# Patient Record
Sex: Female | Born: 1990
Health system: Southern US, Community
[De-identification: ages and names within clinical notes are randomized; demographics above are authoritative.]

## PROBLEM LIST (undated history)

## (undated) DIAGNOSIS — L659 Nonscarring hair loss, unspecified: Secondary | ICD-10-CM

## (undated) DIAGNOSIS — J302 Other seasonal allergic rhinitis: Secondary | ICD-10-CM

## (undated) DIAGNOSIS — J069 Acute upper respiratory infection, unspecified: Secondary | ICD-10-CM

## (undated) HISTORY — PX: NO PAST SURGERIES: SHX2092

## (undated) HISTORY — DX: Nonscarring hair loss, unspecified: L65.9

---

## 2014-05-22 DIAGNOSIS — L639 Alopecia areata, unspecified: Secondary | ICD-10-CM | POA: Insufficient documentation

## 2015-05-19 LAB — HM PAP SMEAR: HM Pap smear: NEGATIVE

## 2015-05-19 LAB — HM HIV SCREENING LAB: HM HIV Screening: NEGATIVE

## 2015-05-19 LAB — HM HEPATITIS C SCREENING LAB: HM Hepatitis Screen: NEGATIVE

## 2015-07-07 DIAGNOSIS — J069 Acute upper respiratory infection, unspecified: Secondary | ICD-10-CM | POA: Diagnosis not present

## 2015-07-07 DIAGNOSIS — R41840 Attention and concentration deficit: Secondary | ICD-10-CM | POA: Diagnosis not present

## 2015-10-26 DIAGNOSIS — R591 Generalized enlarged lymph nodes: Secondary | ICD-10-CM | POA: Diagnosis not present

## 2015-12-16 DIAGNOSIS — Z01419 Encounter for gynecological examination (general) (routine) without abnormal findings: Secondary | ICD-10-CM | POA: Diagnosis not present

## 2015-12-16 DIAGNOSIS — N909 Noninflammatory disorder of vulva and perineum, unspecified: Secondary | ICD-10-CM | POA: Diagnosis not present

## 2016-02-15 DIAGNOSIS — J019 Acute sinusitis, unspecified: Secondary | ICD-10-CM | POA: Diagnosis not present

## 2016-02-15 DIAGNOSIS — B354 Tinea corporis: Secondary | ICD-10-CM | POA: Diagnosis not present

## 2016-02-15 DIAGNOSIS — H6692 Otitis media, unspecified, left ear: Secondary | ICD-10-CM | POA: Diagnosis not present

## 2016-02-15 MED FILL — KETOCONAZOLE 2% CREAM: 2 | 10 days supply | Qty: 30 | Fill #0

## 2016-02-15 MED FILL — AMOX-CLAV 875-125 MG TABLET: 875-125 | 10 days supply | Qty: 20 | Fill #0

## 2016-03-01 ENCOUNTER — Ambulatory Visit (INDEPENDENT_AMBULATORY_CARE_PROVIDER_SITE_OTHER): Payer: 59 | Admitting: Family Medicine

## 2016-03-01 ENCOUNTER — Encounter: Payer: Self-pay | Admitting: Family Medicine

## 2016-03-01 VITALS — BP 110/78 | HR 108 | Temp 98.5°F | Ht 63.0 in | Wt 171.2 lb

## 2016-03-01 DIAGNOSIS — J3089 Other allergic rhinitis: Secondary | ICD-10-CM

## 2016-03-01 DIAGNOSIS — Z1389 Encounter for screening for other disorder: Secondary | ICD-10-CM

## 2016-03-01 DIAGNOSIS — J029 Acute pharyngitis, unspecified: Secondary | ICD-10-CM

## 2016-03-01 DIAGNOSIS — L639 Alopecia areata, unspecified: Secondary | ICD-10-CM

## 2016-03-01 DIAGNOSIS — Z139 Encounter for screening, unspecified: Secondary | ICD-10-CM

## 2016-03-01 DIAGNOSIS — J069 Acute upper respiratory infection, unspecified: Secondary | ICD-10-CM | POA: Diagnosis not present

## 2016-03-01 DIAGNOSIS — Z7189 Other specified counseling: Secondary | ICD-10-CM

## 2016-03-01 DIAGNOSIS — G43909 Migraine, unspecified, not intractable, without status migrainosus: Secondary | ICD-10-CM | POA: Insufficient documentation

## 2016-03-01 DIAGNOSIS — G43009 Migraine without aura, not intractable, without status migrainosus: Secondary | ICD-10-CM

## 2016-03-01 DIAGNOSIS — Z8659 Personal history of other mental and behavioral disorders: Secondary | ICD-10-CM

## 2016-03-01 LAB — POCT RAPID STREP A (OFFICE): Rapid Strep A Screen: NEGATIVE

## 2016-03-01 MED ORDER — METHYLPREDNISOLONE ACETATE 40 MG/ML IJ SUSP
40.0000 mg | Freq: Once | INTRAMUSCULAR | Status: AC
  Administered 2016-03-01: 40 mg via INTRAMUSCULAR

## 2016-03-01 MED ORDER — FEXOFENADINE-PSEUDOEPHED ER 180-240 MG PO TB24: 1.0000 | ORAL_TABLET | Freq: Every day | ORAL | 1 refills | Status: DC

## 2016-03-01 MED ORDER — FLUTICASONE PROPIONATE 50 MCG/ACT NA SUSP: 1.0000 | Freq: Two times a day (BID) | NASAL | 6 refills | Status: DC

## 2016-03-01 MED FILL — ALLEGRA-D 24 HOUR TABLET: 180-240 | 45 days supply | Qty: 45 | Fill #0

## 2016-03-01 MED FILL — FLUTICASONE PROP 50 MCG SPR: 50 | 30 days supply | Qty: 16 | Fill #0

## 2016-03-01 NOTE — Assessment & Plan Note (Addendum)
8-24yo on ritalin. Not a current issue for patient

## 2016-03-01 NOTE — Assessment & Plan Note (Signed)
Patient had not been taking Allegra and/or Flonase regularly. Strongly encouraged to use daily along with Lloyd HugerNeil med sinus rinses twice a day.   Handouts provided and prevention strategies discussed

## 2016-03-01 NOTE — Assessment & Plan Note (Signed)
Did get shots at [facility] in past.  NOt active issue since OCt '15.

## 2016-03-01 NOTE — Patient Instructions (Signed)
-  Discussed with patient seasonal allergies and typical preventative strategies- i.e. N95 mask etc  -Recommended nasal rinses\ washes twice a day as well as after any prolonged exposure to outdoor environmental factors.    - Do the AYR or neil med sinus rinses BID and then use 1 spray flonase after each session.   If you develop fevers and one of your sinuses starts to hurt on one side your face, call our office.    Allergic Rhinitis Allergic rhinitis is when the mucous membranes in the nose respond to allergens. Allergens are particles in the air that cause your body to have an allergic reaction. This causes you to release allergic antibodies. Through a chain of events, these eventually cause you to release histamine into the blood stream. Although meant to protect the body, it is this release of histamine that causes your discomfort, such as frequent sneezing, congestion, and an itchy, runny nose.  CAUSES Seasonal allergic rhinitis (hay fever) is caused by pollen allergens that may come from grasses, trees, and weeds. Year-round allergic rhinitis (perennial allergic rhinitis) is caused by allergens such as house dust mites, pet dander, and mold spores. SYMPTOMS  Nasal stuffiness (congestion).  Itchy, runny nose with sneezing and tearing of the eyes. DIAGNOSIS Your health care provider can help you determine the allergen or allergens that trigger your symptoms. If you and your health care provider are unable to determine the allergen, skin or blood testing may be used. Your health care provider will diagnose your condition after taking your health history and performing a physical exam. Your health care provider may assess you for other related conditions, such as asthma, pink eye, or an ear infection. TREATMENT Allergic rhinitis does not have a cure, but it can be controlled by:  Medicines that block allergy symptoms. These may include allergy shots, nasal sprays, and oral  antihistamines.  Avoiding the allergen. Hay fever may often be treated with antihistamines in pill or nasal spray forms. Antihistamines block the effects of histamine. There are over-the-counter medicines that may help with nasal congestion and swelling around the eyes. Check with your health care provider before taking or giving this medicine. If avoiding the allergen or the medicine prescribed do not work, there are many new medicines your health care provider can prescribe. Stronger medicine may be used if initial measures are ineffective. Desensitizing injections can be used if medicine and avoidance does not work. Desensitization is when a patient is given ongoing shots until the body becomes less sensitive to the allergen. Make sure you follow up with your health care provider if problems continue. HOME CARE INSTRUCTIONS It is not possible to completely avoid allergens, but you can reduce your symptoms by taking steps to limit your exposure to them. It helps to know exactly what you are allergic to so that you can avoid your specific triggers. SEEK MEDICAL CARE IF:  You have a fever.  You develop a cough that does not stop easily (persistent).  You have shortness of breath.  You start wheezing.  Symptoms interfere with normal daily activities.   This information is not intended to replace advice given to you by your health care provider. Make sure you discuss any questions you have with your health care provider.   Document Released: 02/21/2001 Document Revised: 06/19/2014 Document Reviewed: 02/03/2013 Elsevier Interactive Patient Education [employer] Elsevier Inc.

## 2016-03-01 NOTE — Assessment & Plan Note (Signed)
You have a viral infection that should resolve on its own over time.    Symptoms usually last 3-7 days but can stretch out to 2-3 weeks.  Your symptoms should not worsen after 7-10 days and if they do, please notify our office  You can use over-the-counter afrin nasal spray for up to 3 days (NO longer than that) which will help with nasal drainage/congestion short term.   Also, sterile saline nasal rinses, such as Lloyd HugerNeil med sinus rinse, can be very helpful and should be done twice daily.  I would also like you to use an over the counter cold and flu medication such as Tylenol Severe Cold and Sinus or Dayquil/ Nyquil and the like which will help with cough, congestion, pain, and fevers/chills etc.  Unfortunately, antibiotics are not helpful for viral infections.  Wash your hands frequently, as you did not want to get those around you sick as well. Never sneeze or cough on others.  Drink plenty of fluids and stay hydrated, especially if you are running fevers.

## 2016-03-01 NOTE — Progress Notes (Signed)
New patient office visit note:  Impression and Recommendations:    1. Viral URI   2. Sore throat   3. Environmental and seasonal allergies   4. Screening for condition   5. Screening for multiple conditions   6. Counseling on health promotion and disease prevention   7. History of ADHD   8. h/o Alopecia areata   9. Migraine without aura and without status migrainosus, not intractable     h/o Alopecia areata Did get shots at [facility] de[facility] in past.  NOt active issue since OCt '15.    Migraine headache Allergic to all triptans.  Uses excedrin migraine prn.  - Gets them about once every month or so, mild headaches twice weekly--> goes away with one exedrin migraine.    History of ADHD 8-14yo on ritalin. Not a current issue for patient  Environmental and seasonal allergies Patient had not been taking Allegra and/or Flonase regularly. Strongly encouraged to use daily along with Lloyd Huger med sinus rinses twice a day.   Handouts provided and prevention strategies discussed   Viral URI You have a viral infection that should resolve on its own over time, were discussed with patient this could be allergies.  Reassurance given  Symptoms usually last 3-7 days but can stretch out to 2-3 weeks.  Your symptoms should not worsen after 7-10 days and if they do, please notify our office  You can use over-the-counter afrin nasal spray for up to 3 days (NO longer than that) which will help with nasal drainage/congestion short term.   Also, sterile saline nasal rinses, such as Lloyd Huger med sinus rinse, can be very helpful and should be done twice daily.  I would also like you to use an over the counter cold and flu medication such as Tylenol Severe Cold and Sinus or Dayquil/ Nyquil and the like which will help with cough, congestion, pain, and fevers/chills etc.  Unfortunately, antibiotics are not helpful for viral infections.  Wash your hands frequently, as you did not want to  get those around you sick as well. Never sneeze or cough on others.  Drink plenty of fluids and stay hydrated, especially if you are running fevers.   Orders Placed This Encounter  Procedures  . POCT rapid strep A     New Prescriptions   FEXOFENADINE-PSEUDOEPHEDRINE (ALLEGRA-D ALLERGY & CONGESTION) 180-240 MG 24 HR TABLET    Take 1 tablet by mouth daily.   FLUTICASONE (FLONASE) 50 MCG/ACT NASAL SPRAY    Place 1 spray into both nostrils 2 (two) times daily.    Modified Medications   No medications on file    Discontinued Medications   No medications on file    Current Meds  Medication Sig  . aspirin-acetaminophen-caffeine (EXCEDRIN MIGRAINE) 250-250-65 MG tablet Take 1-2 tablets by mouth every 6 (six) hours as needed for headache.  . etonogestrel (NEXPLANON) 68 MG IMPL implant 1 each by Subdermal route once.  . Multiple Vitamin (MULTIVITAMIN) capsule Take 1 capsule by mouth daily.    The patient was counseled, risk factors were discussed, anticipatory guidance given.  Gross side effects, risk and benefits, and alternatives of medications discussed with patient.  Patient is aware that all medications have potential side effects and we are unable to predict every side effect or drug-drug interaction that may occur.  Expresses verbal understanding and consents to current therapy plan and treatment regimen.  Return in about 3 months (around 05/31/2016) for Fasting blood work,  then f/up to discuss OV with me.  Please see AVS handed out to patient at the end of our visit for further patient instructions/ counseling done pertaining to today's office visit.    Note: This document was prepared using Dragon voice recognition software and may include unintentional dictation errors.  ----------------------------------------------------------------------------------------------------------------------    Subjective:    Chief Complaint  Patient presents with  . Establish Care  .  Nasal Congestion  . Sore Throat    HPI: Stefanie White is a pleasant 25 y.o. female who presents to [facility] today to review their medical history with me and establish care.    Pharm [facility].   Sinlge lives at home with Dad and Olene FlossGrandma.  Mom lives in [place] close to pt.  Medical history reviewed with patient in full.  Primary reason for why she is here today is:  ST started 1 wk ago-  had canker sore on it then went away on its own in less than a day.   Last night awoke "gagging up snot/ mucus".   Felt like she was going to gag\stop breathing and choke on it.  Feels like her throat was closing in and swelling.   no wheeze or difficulty in breathing, denies cough. No fever or chills, no rash. Has taken Sudafed- around the clock is her only treatment.     - [place] and had many late nights and was not taking very good care of herself during that period time. She states she got run down.   Wt Readings from Last 3 Encounters:  03/01/16 171 lb 3.2 oz (77.7 kg)   BP Readings from Last 3 Encounters:  03/01/16 110/78   Pulse Readings from Last 3 Encounters:  03/01/16 (!) 108   BMI Readings from Last 3 Encounters:  03/01/16 30.33 kg/m      Patient Active Problem List   Diagnosis Date Noted  . Migraine headache 03/01/2016  . History of ADHD 03/01/2016  . Environmental and seasonal allergies 03/01/2016  . Viral URI 03/01/2016  . h/o Alopecia areata 05/22/2014     Past Medical History:  Diagnosis Date  . Alopecia      Past Medical History:  Diagnosis Date  . Alopecia      History reviewed. No pertinent surgical history.   Family History  Problem Relation Age of Onset  . Diabetes Father 2946  . Hypertension Father 3350  . Depression Brother 10    bipolar  . Cancer Maternal Aunt 45    colon  . Heart attack Paternal Grandmother 3069     History  Drug Use No     History  Alcohol Use  . 0.6 oz/week  . 1 Glasses of wine per week    History  Smoking Status  . Former Smoker  . Packs/day: 0.25  . Years: 1.00  . Types: Cigarettes  . Quit date: 06/12/2010  Smokeless Tobacco  . Never Used     Patient's Medications  New Prescriptions   FEXOFENADINE-PSEUDOEPHEDRINE (ALLEGRA-D ALLERGY & CONGESTION) 180-240 MG 24 HR TABLET    Take 1 tablet by mouth daily.   FLUTICASONE (FLONASE) 50 MCG/ACT NASAL SPRAY    Place 1 spray into both nostrils 2 (two) times daily.  Previous Medications   ASPIRIN-ACETAMINOPHEN-CAFFEINE (EXCEDRIN MIGRAINE) 250-250-65 MG TABLET    Take 1-2 tablets by mouth every 6 (six) hours as needed for headache.  ETONOGESTREL (NEXPLANON) 68 MG IMPL IMPLANT    1 each by Subdermal route once.   MULTIPLE VITAMIN (MULTIVITAMIN) CAPSULE    Take 1 capsule by mouth daily.  Modified Medications   No medications on file  Discontinued Medications   No medications on file    Allergies: Rizatriptan  Review of Systems  Constitutional: Negative.  Negative for chills and fever.  HENT: Positive for congestion, sore throat and tinnitus.   Eyes: Negative.  Negative for blurred vision, photophobia, discharge and redness.  Respiratory: Negative for cough, sputum production, shortness of breath and wheezing.   Cardiovascular: Negative.  Negative for chest pain and orthopnea.  Gastrointestinal: Negative.  Negative for diarrhea, nausea and vomiting.  Genitourinary: Negative.  Negative for dysuria and urgency.  Musculoskeletal: Negative.   Skin: Negative.  Negative for rash.  Neurological: Negative.  Negative for dizziness.  Endo/Heme/Allergies: Negative.   Psychiatric/Behavioral: Negative.      Objective:    Blood pressure 110/78, pulse (!) 108, temperature 98.5 F (36.9 C), temperature source Oral, height 5\' 3"  (1.6 m), weight 171 lb 3.2 oz (77.7 kg). Body mass index is 30.33 kg/m. General: Well Developed, well nourished, and in no  acute distress.  Neuro: Alert and oriented x3, extra-ocular muscles intact, sensation grossly intact.  HEENT: Normocephalic, atraumatic, pupils equal round reactive to light, neck supple, no gross masses, Mild posterior lymphadenopathy nontender, oropharynx-tonsillar hypertrophy is symmetrical, no uvula deviation, mild erythema only. TMs bilaterally: Chronic changes only with some scarring of TM Skin: no gross rash, good skin turgor Cardiac: Regular rate and rhythm, no murmurs rubs or gallops.  Respiratory: Essentially clear to auscultation bilaterally. No crackles wheezes rales or rhonchi. Not using accessory muscles, speaking in full sentences.  Abdominal: Soft, not grossly distended Musculoskeletal: Ambulates w/o diff, FROM * 4 ext.  Vasc: less 2 sec cap RF, warm and pink  Psych:  No HI/SI, judgement and insight good, Euthymic mood. Full Affect.

## 2016-03-01 NOTE — Assessment & Plan Note (Signed)
Allergic to all triptans.  Uses excedrin migraine prn.  - Gets them about once every month or so, mild headaches twice weekly--> goes away with one exedrin migraine.

## 2016-03-03 ENCOUNTER — Encounter: Payer: Self-pay | Admitting: Family Medicine

## 2016-03-03 ENCOUNTER — Ambulatory Visit (INDEPENDENT_AMBULATORY_CARE_PROVIDER_SITE_OTHER): Payer: 59 | Admitting: Family Medicine

## 2016-03-03 VITALS — BP 117/87 | HR 109 | Temp 98.0°F | Ht 63.0 in | Wt 166.1 lb

## 2016-03-03 DIAGNOSIS — J029 Acute pharyngitis, unspecified: Secondary | ICD-10-CM | POA: Diagnosis not present

## 2016-03-03 DIAGNOSIS — H6500 Acute serous otitis media, unspecified ear: Secondary | ICD-10-CM

## 2016-03-03 DIAGNOSIS — R509 Fever, unspecified: Secondary | ICD-10-CM | POA: Insufficient documentation

## 2016-03-03 DIAGNOSIS — H60399 Other infective otitis externa, unspecified ear: Secondary | ICD-10-CM | POA: Insufficient documentation

## 2016-03-03 DIAGNOSIS — H6692 Otitis media, unspecified, left ear: Secondary | ICD-10-CM | POA: Insufficient documentation

## 2016-03-03 DIAGNOSIS — H60392 Other infective otitis externa, left ear: Secondary | ICD-10-CM

## 2016-03-03 MED ORDER — AMOXICILLIN 875 MG PO TABS: 875.0000 mg | ORAL_TABLET | Freq: Two times a day (BID) | ORAL | 0 refills | Status: DC

## 2016-03-03 MED ORDER — CIPROFLOXACIN-DEXAMETHASONE 0.3-0.1 % OT SUSP: 4.0000 [drp] | Freq: Two times a day (BID) | OTIC | 0 refills | Status: AC

## 2016-03-03 MED ORDER — PREDNISONE 20 MG PO TABS: ORAL_TABLET | ORAL | 0 refills | Status: DC

## 2016-03-03 MED FILL — CIPRODEX OTIC SUSPENSION: 0.3-0.1 | 18 days supply | Qty: 8 | Fill #0

## 2016-03-03 MED FILL — predniSONE 20 MG TABS: 20 | 6 days supply | Qty: 14 | Fill #0

## 2016-03-03 MED FILL — AMOXICILLIN 875 MG TABLET: 875 | 10 days supply | Qty: 20 | Fill #0

## 2016-03-03 NOTE — Patient Instructions (Signed)
No work until at least Monday. You need to rest, push fluids, and take care of herself.   Otitis Media, Adult Otitis media is redness, soreness, and inflammation of the middle ear. Otitis media may be caused by allergies or, most commonly, by infection. Often it occurs as a complication of the common cold. SIGNS AND SYMPTOMS Symptoms of otitis media may include:  Earache.  Fever.  Ringing in your ear.  Headache.  Leakage of fluid from the ear. DIAGNOSIS To diagnose otitis media, your health care provider will examine your ear with an otoscope. This is an instrument that allows your health care provider to see into your ear in order to examine your eardrum. Your health care provider also will ask you questions about your symptoms. TREATMENT  Typically, otitis media resolves on its own within 3-5 days. Your health care provider may prescribe medicine to ease your symptoms of pain. If otitis media does not resolve within 5 days or is recurrent, your health care provider may prescribe antibiotic medicines if he or she suspects that a bacterial infection is the cause. HOME CARE INSTRUCTIONS   If you were prescribed an antibiotic medicine, finish it all even if you start to feel better.  Take medicines only as directed by your health care provider.  Keep all follow-up visits as directed by your health care provider. SEEK MEDICAL CARE IF:  You have otitis media only in one ear, or bleeding from your nose, or both.  You notice a lump on your neck.  You are not getting better in 3-5 days.  You feel worse instead of better. SEEK IMMEDIATE MEDICAL CARE IF:   You have pain that is not controlled with medicine.  You have swelling, redness, or pain around your ear or stiffness in your neck.  You notice that part of your face is paralyzed.  You notice that the bone behind your ear (mastoid) is tender when you touch it. MAKE SURE YOU:   Understand these instructions.  Will watch  your condition.  Will get help right away if you are not doing well or get worse.   This information is not intended to replace advice given to you by your health care provider. Make sure you discuss any questions you have with your health care provider.   Document Released: 03/03/2004 Document Revised: 06/19/2014 Document Reviewed: 12/24/2012 Elsevier Interactive Patient Education 2016 Elsevier Inc. Otitis Externa Otitis externa is a bacterial or fungal infection of the outer ear canal. This is the area from the eardrum to the outside of the ear. Otitis externa is sometimes called "swimmer's ear." CAUSES  Possible causes of infection include:  Swimming in dirty water.  Moisture remaining in the ear after swimming or bathing.  Mild injury (trauma) to the ear.  Objects stuck in the ear (foreign body).  Cuts or scrapes (abrasions) on the outside of the ear. SIGNS AND SYMPTOMS  The first symptom of infection is often itching in the ear canal. Later signs and symptoms may include swelling and redness of the ear canal, ear pain, and yellowish-white fluid (pus) coming from the ear. The ear pain may be worse when pulling on the earlobe. DIAGNOSIS  Your health care provider will perform a physical exam. A sample of fluid may be taken from the ear and examined for bacteria or fungi. TREATMENT  Antibiotic ear drops are often given for 10 to 14 days. Treatment may also include pain medicine or corticosteroids to reduce itching and swelling. HOME CARE  INSTRUCTIONS   Apply antibiotic ear drops to the ear canal as prescribed by your health care provider.  Take medicines only as directed by your health care provider.  If you have diabetes, follow any additional treatment instructions from your health care provider.  Keep all follow-up visits as directed by your health care provider. PREVENTION   Keep your ear dry. Use the corner of a towel to absorb water out of the ear canal after swimming  or bathing.  Avoid scratching or putting objects inside your ear. This can damage the ear canal or remove the protective wax that lines the canal. This makes it easier for bacteria and fungi to grow.  Avoid swimming in lakes, polluted water, or poorly chlorinated pools.  You may use ear drops made of rubbing alcohol and vinegar after swimming. Combine equal parts of white vinegar and alcohol in a bottle. Put 3 or 4 drops into each ear after swimming. SEEK MEDICAL CARE IF:   You have a fever.  Your ear is still red, swollen, painful, or draining pus after 3 days.  Your redness, swelling, or pain gets worse.  You have a severe headache.  You have redness, swelling, pain, or tenderness in the area behind your ear. MAKE SURE YOU:   Understand these instructions.  Will watch your condition.  Will get help right away if you are not doing well or get worse.   This information is not intended to replace advice given to you by your health care provider. Make sure you discuss any questions you have with your health care provider.   Document Released: 05/29/2005 Document Revised: 06/19/2014 Document Reviewed: 06/15/2011 Elsevier Interactive Patient Education [employer]

## 2016-03-03 NOTE — Progress Notes (Signed)
Assessment and plan:  1. Acute serous otitis media, recurrence not specified, unspecified laterality   2. Otitis, externa, infective, left   3. Pharyngitis   4. Fever, unspecified    - oral ABX and otic drops to cover for both.     - Supportive care and various OTC medications discussed in addition to any prescribed. - Call or RTC if new symptoms, or if no improvement or worse over next couple days.     Meds ordered this encounter  Medications  . amoxicillin (AMOXIL) 875 MG tablet    Sig: Take 1 tablet (875 mg total) by mouth 2 (two) times daily.    Dispense:  20 tablet    Refill:  0  . ciprofloxacin-dexamethasone (CIPRODEX) otic suspension    Sig: Place 4 drops into the left ear 2 (two) times daily.    Dispense:  1 mL    Refill:  0  . predniSONE (DELTASONE) 20 MG tablet    Sig: Take 3 pills a day for 2 days, 2 pills a day for 2 days, 1 pill a day for 2 days then one half pill a day for 2 days then off    Dispense:  14 tablet    Refill:  0    Anticipatory guidance and routine counseling done re: condition, txmnt options and need for follow up. All questions of patient's were answered.   Gross side effects, risk and benefits, and alternatives of medications discussed with patient.  Patient is aware that all medications have potential side effects and we are unable to predict every sideeffect or drug-drug interaction that may occur.  Expresses verbal understanding and consents to current therapy plan and treatment regiment.  No Follow-up on file.  Please see AVS handed out to patient at the end of our visit for additional patient instructions/ counseling done pertaining to today's office visit.  Note: This document was prepared using Dragon voice recognition software and may include unintentional dictation errors.    Subjective:    CC: Ear pain on top of 9 day URI sx  HPI:  Pt presents with L ear pain- acute onset  -terrible at 6 am this Morning.    C/o rhinorrhea,  and PND cough in Am's.   URI sx 9 days overall now. Using netti-pot daily- helping some but temporary.  + F/C- Subjective. , No face pain,  + L ear pain, No N/D, vomited once when did netti pot wrong, + fatigue; No SOB/DIB, No Rash.  Sent home from work yesterday.   Allegra d and flanoase BID, some sudafed, tried nyquil last night   Overall getting W    Patient Active Problem List   Diagnosis Date Noted  . Acute left otitis media 03/03/2016  . Otitis, externa, infective 03/03/2016  . Pharyngitis 03/03/2016  . Fever, unspecified 03/03/2016  . Migraine headache 03/01/2016  . History of ADHD 03/01/2016  . Environmental and seasonal allergies 03/01/2016  . Viral URI 03/01/2016  . h/o Alopecia areata 05/22/2014    Past medical history, Surgical history, Family history reviewed and noted below, Social history, Allergies, and Medications have been entered into the medical record, reviewed and changed as needed.   Allergies  Allergen Reactions  . Rizatriptan Swelling    Mouth swelling    Review of Systems  Constitutional: Positive for chills and malaise/fatigue. Negative for fever.  HENT: Positive for congestion, ear pain and sore throat. Negative for ear discharge.        Left ear  pain with shooting sensations  Eyes: Negative.  Negative for pain, discharge and redness.  Respiratory: Positive for cough. Negative for sputum production, shortness of breath and wheezing.   Cardiovascular: Negative.   Gastrointestinal: Positive for vomiting. Negative for diarrhea and nausea.  Genitourinary: Negative.   Musculoskeletal: Positive for myalgias.  Skin: Negative for rash.  Neurological: Positive for weakness. Negative for dizziness.  Endo/Heme/Allergies: Negative.   Psychiatric/Behavioral: Negative.     Objective:   Blood pressure 117/87, pulse (!) 109, temperature 98 F (36.7 C), temperature source Oral, height 5\' 3"  (1.6 m), weight 166 lb 1.6 oz (75.3 kg), last menstrual period  02/11/2016. Body mass index is 29.42 kg/m.  General: Well Developed, well nourished, appropriate for stated age.  Neuro: Alert and oriented x3, extra-ocular muscles intact, sensation grossly intact.  HEENT: Normocephalic, atraumatic, pupils equal round reactive to light, + painful lymphadenopathy, TM's intact B/L, L- bulding, erythema, also mild swelling EAC, Nares- patent/ Edematous and erythematous, clear d/c, OP- clear, mild erythema, enlrged tonsils- no bigger than 2 d ago. No exudate.  Skin: Warm and dry, no gross rash. Cardiac: RRR, S1 S2,  no murmurs rubs or gallops.  Respiratory: ECTA B/L and A/P, Not using accessory muscles, speaking in full sentences- unlabored. Vascular:  No gross lower ext edema, cap RF less 2 sec. Psych: No HI/SI, judgement and insight good, Euthymic mood. Full Affect.

## 2016-05-15 ENCOUNTER — Other Ambulatory Visit (INDEPENDENT_AMBULATORY_CARE_PROVIDER_SITE_OTHER): Payer: 59

## 2016-05-15 DIAGNOSIS — J069 Acute upper respiratory infection, unspecified: Secondary | ICD-10-CM

## 2016-05-15 DIAGNOSIS — Z139 Encounter for screening, unspecified: Secondary | ICD-10-CM

## 2016-05-15 DIAGNOSIS — Z1389 Encounter for screening for other disorder: Secondary | ICD-10-CM | POA: Diagnosis not present

## 2016-05-15 DIAGNOSIS — J029 Acute pharyngitis, unspecified: Secondary | ICD-10-CM | POA: Diagnosis not present

## 2016-05-15 DIAGNOSIS — B9789 Other viral agents as the cause of diseases classified elsewhere: Secondary | ICD-10-CM | POA: Diagnosis not present

## 2016-05-15 DIAGNOSIS — J3089 Other allergic rhinitis: Secondary | ICD-10-CM | POA: Diagnosis not present

## 2016-05-15 DIAGNOSIS — L639 Alopecia areata, unspecified: Secondary | ICD-10-CM

## 2016-05-15 DIAGNOSIS — Z7189 Other specified counseling: Secondary | ICD-10-CM

## 2016-05-15 LAB — CBC WITH DIFFERENTIAL/PLATELET
BASOS ABS: 82 {cells}/uL (ref 0–200)
Basophils Relative: 1 %
EOS ABS: 164 {cells}/uL (ref 15–500)
EOS PCT: 2 %
HCT: 41.1 % (ref 35.0–45.0)
HEMOGLOBIN: 13.8 g/dL (ref 11.7–15.5)
LYMPHS ABS: 2296 {cells}/uL (ref 850–3900)
Lymphocytes Relative: 28 %
MCH: 29.7 pg (ref 27.0–33.0)
MCHC: 33.6 g/dL (ref 32.0–36.0)
MCV: 88.4 fL (ref 80.0–100.0)
MONOS PCT: 7 %
MPV: 9.3 fL (ref 7.5–12.5)
Monocytes Absolute: 574 cells/uL (ref 200–950)
NEUTROS PCT: 62 %
Neutro Abs: 5084 cells/uL (ref 1500–7800)
Platelets: 304 10*3/uL (ref 140–400)
RBC: 4.65 MIL/uL (ref 3.80–5.10)
RDW: 12.9 % (ref 11.0–15.0)
WBC: 8.2 10*3/uL (ref 3.8–10.8)

## 2016-05-15 LAB — T4, FREE: Free T4: 1.2 ng/dL (ref 0.8–1.8)

## 2016-05-15 LAB — TSH: TSH: 2.14 mIU/L

## 2016-05-16 LAB — COMPREHENSIVE METABOLIC PANEL
ALBUMIN: 4.2 g/dL (ref 3.6–5.1)
ALT: 19 U/L (ref 6–29)
AST: 19 U/L (ref 10–30)
Alkaline Phosphatase: 76 U/L (ref 33–115)
BUN: 9 mg/dL (ref 7–25)
CHLORIDE: 104 mmol/L (ref 98–110)
CO2: 25 mmol/L (ref 20–31)
CREATININE: 0.62 mg/dL (ref 0.50–1.10)
Calcium: 9.4 mg/dL (ref 8.6–10.2)
Glucose, Bld: 82 mg/dL (ref 65–99)
POTASSIUM: 4.9 mmol/L (ref 3.5–5.3)
SODIUM: 138 mmol/L (ref 135–146)
TOTAL PROTEIN: 7.3 g/dL (ref 6.1–8.1)
Total Bilirubin: 0.6 mg/dL (ref 0.2–1.2)

## 2016-05-16 LAB — LIPID PANEL
CHOL/HDL RATIO: 4.4 ratio (ref <5.0)
CHOLESTEROL: 141 mg/dL (ref <200)
HDL: 32 mg/dL — AB (ref >50)
LDL CALC: 82 mg/dL (ref <100)
TRIGLYCERIDES: 134 mg/dL (ref <150)
VLDL: 27 mg/dL (ref <30)

## 2016-05-16 LAB — VITAMIN D 25 HYDROXY (VIT D DEFICIENCY, FRACTURES): Vit D, 25-Hydroxy: 29 ng/mL — ABNORMAL LOW (ref 30–100)

## 2016-05-22 ENCOUNTER — Encounter: Payer: Self-pay | Admitting: Family Medicine

## 2016-05-22 ENCOUNTER — Ambulatory Visit (INDEPENDENT_AMBULATORY_CARE_PROVIDER_SITE_OTHER): Payer: 59 | Admitting: Family Medicine

## 2016-05-22 VITALS — BP 89/63 | HR 83 | Ht 63.0 in | Wt 170.5 lb

## 2016-05-22 DIAGNOSIS — E669 Obesity, unspecified: Secondary | ICD-10-CM | POA: Diagnosis not present

## 2016-05-22 DIAGNOSIS — E559 Vitamin D deficiency, unspecified: Secondary | ICD-10-CM

## 2016-05-22 DIAGNOSIS — R748 Abnormal levels of other serum enzymes: Secondary | ICD-10-CM | POA: Insufficient documentation

## 2016-05-22 DIAGNOSIS — E66811 Obesity, class 1: Secondary | ICD-10-CM | POA: Insufficient documentation

## 2016-05-22 NOTE — Assessment & Plan Note (Signed)
MVI & vit D3 2,000 IU

## 2016-05-22 NOTE — Assessment & Plan Note (Signed)
Discussed strategies to improve her cholesterol panel including increasing HDL and lowering LDL and triglycerides. Handouts provided. Recheck 1 year

## 2016-05-22 NOTE — Assessment & Plan Note (Signed)
Discussed with patient if she would like to come in sooner to discuss weight loss strategies she can.   Otherwise will see her in 6 months for her yearly physical.

## 2016-05-22 NOTE — Patient Instructions (Addendum)
85 ounces of water per day minimum. Try to cut back on any caloric beverages.   Please realize, EXERCISE IS MEDICINE!  -  American Heart Association [facility]( AHA) guidelines for exercise : If you are in good health, without any medical conditions, you should engage in 150 minutes of moderate intensity aerobic activity per week.  This means you should be huffing and puffing throughout your workout.   Engaging in regular exercise will improve brain function and memory, as well as improve mood, boost immune system and help with weight management.  As well as the other, more well-known effects of exercise such as decreasing blood sugar levels, decreasing blood pressure,  and decreasing bad cholesterol levels/ increasing good cholesterol levels.     -  The AHA strongly endorses consumption of a diet that contains a variety of foods from all the food categories with an emphasis on fruits and vegetables; fat-free and low-fat dairy products; cereal and grain products; legumes and nuts; and fish, poultry, and/or extra lean meats.    Excessive food intake, especially of foods high in saturated and trans fats, sugar, and salt, should be avoided.    Adequate water intake of roughly 1/2 of your weight in pounds, should equal the ounces of water per day you should drink.  So for instance, if you're 200 pounds, that would be 100 ounces of water per day.         Mediterranean Diet  Why follow it? Research shows. . Those who follow the Mediterranean diet have a reduced risk of heart disease  . The diet is associated with a reduced incidence of Parkinson's and Alzheimer's diseases . People following the diet may have longer life expectancies and lower rates of chronic diseases  . The Dietary Guidelines for Americans recommends the Mediterranean diet as an eating plan to promote health and prevent disease  What Is the Mediterranean Diet?  . Healthy eating plan based on typical foods and recipes of Mediterranean-style  cooking . The diet is primarily a plant based diet; these foods should make up a majority of meals   Starches - Plant based foods should make up a majority of meals - They are an important sources of vitamins, minerals, energy, antioxidants, and fiber - Choose whole grains, foods high in fiber and minimally processed items  - Typical grain sources include wheat, oats, barley, corn, brown rice, bulgar, farro, millet, polenta, couscous  - Various types of beans include chickpeas, lentils, fava beans, black beans, white beans   Fruits  Veggies - Large quantities of antioxidant rich fruits & veggies; 6 or more servings  - Vegetables can be eaten raw or lightly drizzled with oil and cooked  - Vegetables common to the traditional Mediterranean Diet include: artichokes, arugula, beets, broccoli, brussel sprouts, cabbage, carrots, celery, collard greens, cucumbers, eggplant, kale, leeks, lemons, lettuce, mushrooms, okra, onions, peas, peppers, potatoes, pumpkin, radishes, rutabaga, shallots, spinach, sweet potatoes, turnips, zucchini - Fruits common to the Mediterranean Diet include: apples, apricots, avocados, cherries, clementines, dates, figs, grapefruits, grapes, melons, nectarines, oranges, peaches, pears, pomegranates, strawberries, tangerines  Fats - Replace butter and margarine with healthy oils, such as olive oil, canola oil, and tahini  - Limit nuts to no more than a handful a day  - Nuts include walnuts, almonds, pecans, pistachios, pine nuts  - Limit or avoid candied, honey roasted or heavily salted nuts - Olives are central to the Mediterranean diet - can be eaten whole or used in a variety of  dishes   Meats Protein - Limiting red meat: no more than a few times a month - When eating red meat: choose lean cuts and keep the portion to the size of deck of cards - Eggs: approx. 0 to 4 times a week  - Fish and lean poultry: at least 2 a week  - Healthy protein sources include, chicken, [place],  lean beef, lamb - Increase intake of seafood such as tuna, salmon, trout, mackerel, shrimp, scallops - Avoid or limit high fat processed meats such as sausage and bacon  Dairy - Include moderate amounts of low fat dairy products  - Focus on healthy dairy such as fat free yogurt, skim milk, low or reduced fat cheese - Limit dairy products higher in fat such as whole or 2% milk, cheese, ice cream  Alcohol - Moderate amounts of red wine is ok  - No more than 5 oz daily for women (all ages) and men older than age 31  - No more than 10 oz of wine daily for men younger than 94  Other - Limit sweets and other desserts  - Use herbs and spices instead of salt to flavor foods  - Herbs and spices common to the traditional Mediterranean Diet include: basil, bay leaves, chives, cloves, cumin, fennel, garlic, lavender, marjoram, mint, oregano, parsley, pepper, rosemary, sage, savory, sumac, tarragon, thyme   It's not just a diet, it's a lifestyle:  . The Mediterranean diet includes lifestyle factors typical of those in the region  . Foods, drinks and meals are best eaten with others and savored . Daily physical activity is important for overall good health . This could be strenuous exercise like running and aerobics . This could also be more leisurely activities such as walking, housework, yard-work, or taking the stairs . Moderation is the key; a balanced and healthy diet accommodates most foods and drinks . Consider portion sizes and frequency of consumption of certain foods   Meal Ideas & Options:  . Breakfast:  o Whole wheat toast or whole wheat English muffins with peanut butter & hard boiled egg o Steel cut oats topped with apples & cinnamon and skim milk  o Fresh fruit: banana, strawberries, melon, berries, peaches  o Smoothies: strawberries, bananas, greek yogurt, peanut butter o Low fat greek yogurt with blueberries and granola  o Egg white omelet with spinach and mushrooms o Breakfast  couscous: whole wheat couscous, apricots, skim milk, cranberries  . Sandwiches:  o Hummus and grilled vegetables (peppers, zucchini, squash) on whole wheat bread   o Grilled chicken on whole wheat pita with lettuce, tomatoes, cucumbers or tzatziki  o Tuna salad on whole wheat bread: tuna salad made with greek yogurt, olives, red peppers, capers, green onions o Garlic rosemary lamb pita: lamb sauted with garlic, rosemary, salt & pepper; add lettuce, cucumber, greek yogurt to pita - flavor with lemon juice and black pepper  . Seafood:  o Mediterranean grilled salmon, seasoned with garlic, basil, parsley, lemon juice and black pepper o Shrimp, lemon, and spinach whole-grain pasta salad made with low fat greek yogurt  o Seared scallops with lemon orzo  o Seared tuna steaks seasoned salt, pepper, coriander topped with tomato mixture of olives, tomatoes, olive oil, minced garlic, parsley, green onions and cappers  . Meats:  o Herbed greek chicken salad with kalamata olives, cucumber, feta  o Red bell peppers stuffed with spinach, bulgur, lean ground beef (or lentils) & topped with feta   o Kebabs: skewers of  chicken, tomatoes, onions, zucchini, squash  o [place] burgers: made with red onions, mint, dill, lemon juice, feta cheese topped with roasted red peppers . Vegetarian o Cucumber salad: cucumbers, artichoke hearts, celery, red onion, feta cheese, tossed in olive oil & lemon juice  o Hummus and whole grain pita points with a greek salad (lettuce, tomato, feta, olives, cucumbers, red onion) o Lentil soup with celery, carrots made with vegetable broth, garlic, salt and pepper  Tabouli salad: parsley, bulgur, mint, scallions, cucumbers, tomato, radishes, lemon juice, olive oil, salt and pepper.   Nine ways to increase your "good" HDL cholesterol  High-density lipoprotein (HDL) is often referred to as the "good" cholesterol. Having high HDL levels helps carry cholesterol from your arteries to your  liver, where it can be used or excreted.  Having high levels of HDL also has antioxidant and anti-inflammatory effects, and is linked to a reduced risk of heart disease (1, 2).  Most health experts recommend minimum blood levels of 40 mg/dl in men and 50 mg/dl in women.  While genetics definitely play a role, there are several other factors that affect HDL levels.  Here are nine healthy ways to raise your "good" HDL cholesterol.  1. Consume olive oil  two pieces of salmon on a plate olive oil being poured into a small dish Extra virgin olive oil may be more healthful than processed olive oils. Olive oil is one of the healthiest fats around.  A large analysis of 42 studies with more than 800,000 participants found that olive oil was the only source of monounsaturated fat that seemed to reduce heart disease risk (3).  Research has shown that one of olive oil's heart-healthy effects is an increase in HDL cholesterol. This effect is thought to be caused by antioxidants it contains called polyphenols (4, 5, 6, 7).  Extra virgin olive oil has more polyphenols than more processed olive oils, although the amount can still vary among different types and brands.  One study gave 200 healthy young men about 2 tablespoons (25 ml) of different olive oils per day for three weeks.  The researchers found that participants' HDL levels increased significantly more after they consumed the olive oil with the highest polyphenol content (6).  In another study, when 64 older adults consumed about 4 tablespoons (50 ml) of high-polyphenol extra virgin olive oil every day for six weeks, their HDL cholesterol increased by 6.5 mg/dl, on average (7).  In addition to raising HDL levels, olive oil has been found to boost HDL's anti-inflammatory and antioxidant function in studies of older people and individuals with high cholesterol levels ( 7, 8, 9).  Whenever possible, select high-quality, certified extra virgin  olive oils, which tend to be highest in polyphenols.  Bottom line: Extra virgin olive oil with a high polyphenol content has been shown to increase HDL levels in healthy people, the elderly and individuals with high cholesterol.  2. Follow a low-carb or ketogenic diet  Low-carb and ketogenic diets provide a number of health benefits, including weight loss and reduced blood sugar levels.  They have also been shown to increase HDL cholesterol in people who tend to have lower levels.  This includes those who are obese, insulin resistant or diabetic (10, 11, 12, 13, 14, 15, 16, 17).  In one study, people with type 2 diabetes were split into two groups.  One followed a diet consuming less than 50 grams of carbs per day. The other followed a high-carb diet.  Although both  groups lost weight, the low-carb group's HDL cholesterol increased almost twice as much as the high-carb group's did (14).  In another study, obese people who followed a low-carb diet experienced an increase in HDL cholesterol of 5 mg/dl overall.  Meanwhile, in the same study, the participants who ate a low-fat, high-carb diet showed a decrease in HDL cholesterol (15).  This response may partially be due to the higher levels of fat people typically consume on low-carb diets.  One study in overweight women found that diets high in meat and cheese increased HDL levels by 5-8%, compared to a higher-carb diet (18).  What's more, in addition to raising HDL cholesterol, very-low-carb diets have been shown to decrease triglycerides and improve several other risk factors for heart disease (13, 14, 16, 17).  Bottom line: Low-carb and ketogenic diets typically increase HDL cholesterol levels in people with diabetes, metabolic syndrome and obesity.  3. Exercise regularly  Being physically active is important for heart health.  Studies have shown that many different types of exercise are effective at raising HDL cholesterol,  including strength training, high-intensity exercise and aerobic exercise (19, 20, 21, 22, 23, 24).  However, the biggest increases in HDL are typically seen with high-intensity exercise.  One small study followed women who were living with polycystic ovary syndrome (PCOS), which is linked to a higher risk of insulin resistance. The study required them to perform high-intensity exercise three times a week.  The exercise led to an increase in HDL cholesterol of 8 mg/dL after 10 weeks. The women also showed improvements in other health markers, including decreased insulin resistance and improved arterial function (23).  In a 12-week study, overweight men who performed high-intensity exercise experienced a 10% increase in HDL cholesterol.  In contrast, the low-intensity exercise group showed only a 2% increase and the endurance training group experienced no change (24).  However, even lower-intensity exercise seems to increase HDL's anti-inflammatory and antioxidant capacities, whether or not HDL levels change (20, 21, 25).  Overall, high-intensity exercise such as high-intensity interval training (HIIT) and high-intensity circuit training (HICT) may boost HDL cholesterol levels the most.  Bottom line: Exercising several times per week can help raise HDL cholesterol and enhance its anti-inflammatory and antioxidant effects. High-intensity forms of exercise may be especially effective.  4. Add coconut oil to your diet  Studies have shown that coconut oil may reduce appetite, increase metabolic rate and help protect brain health, among other benefits.  Some people may be concerned about coconut oil's effects on heart health due to its high saturated fat content.  However, it appears that coconut oil is actually quite heart healthy.  Coconut oil tends to raise HDL cholesterol more than many other types of fat.  In addition, it may improve the ratio of low-density-lipoprotein (LDL) cholesterol,  the "bad" cholesterol, to HDL cholesterol. Improving this ratio reduces heart disease risk (26, 27, 28, 29).  One study examined the health effects of coconut oil on 40 women with excess belly fat. The researchers found that participants who took coconut oil daily experienced increased HDL cholesterol and a lower LDL-to-HDL ratio.  In contrast, the group who took soybean oil daily had a decrease in HDL cholesterol and an increase in the LDL-to-HDL ratio (29).  Most studies have found these health benefits occur at a dosage of about 2 tablespoons (30 ml) of coconut oil per day. It's best to incorporate this into cooking rather than eating spoonfuls of coconut oil on their own.  Bottom line: Consuming 2 tablespoons (30 ml) of coconut oil per day may help increase HDL cholesterol levels.  5. Stop smoking  cigarette butt Quitting smoking can reduce the risk of heart disease and lung cancer. Smoking increases the risk of many health problems, including heart disease and lung cancer (30).  One of its negative effects is a suppression of HDL cholesterol.  Some studies have found that quitting smoking can increase HDL levels. Indeed, one study found no significant differences in HDL levels between former smokers and people who had never smoked (31, 32, 33, 34, 35).  In a one-year study of more than 1,500 people, those who quit smoking had twice the increase in HDL as those who resumed smoking within the year. The number of large HDL particles also increased, which further reduced heart disease risk (32).  One study followed smokers who switched from traditional cigarettes to electronic cigarettes for one year. They found that the switch was associated with an increase in HDL cholesterol of 5 mg/dl, on average (33).  When it comes to the effect of nicotine replacement patches on HDL levels, research results have been mixed.  One study found that nicotine replacement therapy led to higher HDL  cholesterol. However, other research suggests that people who use nicotine patches likely won't see increases in HDL levels until after replacement therapy is completed (34, 36).  Even in studies where HDL cholesterol levels didn't increase after people quit smoking, HDL function improved, resulting in less inflammation and other beneficial effects on heart health (37).  Bottom line: Quitting smoking can increase HDL levels, improve HDL function and help protect heart health.  6. Lose weight  When overweight and obese people lose weight, their HDL cholesterol levels usually increase.  What's more, this benefit seems to occur whether weight loss is achieved by calorie counting, carb restriction, intermittent fasting, weight loss surgery or a combination of diet and exercise (16, 38, 39, 40, 41, 42).  One study examined HDL levels in more than 3,000 overweight and obese [place] adults who followed a lifestyle modification program for one year.  The researchers found that losing at least 6.6 lbs (3 kg) led to an increase in HDL cholesterol of 4 mg/dl, on average (41).  In another study, when obese people with type 2 diabetes consumed calorie-restricted diets that provided 20-30% of calories from protein, they experienced significant increases in HDL cholesterol levels (42).  The key to achieving and maintaining healthy HDL cholesterol levels is choosing the type of diet that makes it easiest for you to lose weight and keep it off.  Bottom Line: Several methods of weight loss have been shown to increase HDL cholesterol levels in people who are overweight or obese.  7. Choose purple produce  Consuming purple-colored fruits and vegetables is a delicious way to potentially increase HDL cholesterol.  Purple produce contains antioxidants known as anthocyanins.  Studies using anthocyanin extracts have shown that they help fight inflammation, protect your cells from damaging free radicals and may  also raise HDL cholesterol levels (43, 44, 45, 46).  In a 24-week study of 58 people with diabetes, those who took an anthocyanin supplement twice a day experienced a 19% increase in HDL cholesterol, on average, along with other improvements in heart health markers (45).  In another study, when people with cholesterol issues took anthocyanin extract for 12 weeks, their HDL cholesterol levels increased by 13.7% (46).  Although these studies used extracts instead of foods, there are several fruits  and vegetables that are very high in anthocyanins. These include eggplant, purple corn, red cabbage, blueberries, blackberries and black raspberries.  Bottom line: Consuming fruits and vegetables rich in anthocyanins may help increase HDL cholesterol levels.  8. Eat fatty fish often  The omega-3 fats in fatty fish provide major benefits to heart health, including a reduction in inflammation and better functioning of the cells that line your arteries (47, 48).  There's some research showing that eating fatty fish or taking fish oil may also help raise low levels of HDL cholesterol (49, 50, 51, 52, 53).  In a study of 33 heart disease patients, participants that consumed fatty fish four times per week experienced an increase in HDL cholesterol levels. The particle size of their HDL also increased (52).  In another study, overweight men who consumed herring five days a week for six weeks had a 5% increase in HDL cholesterol, compared with their levels after eating lean pork and chicken five days a week (53).  However, there are a few studies that found no increase in HDL cholesterol in response to increased fish or omega-3 supplement intake (54, 55).  In addition to herring, other types of fatty fish that may help raise HDL cholesterol include salmon, sardines, mackerel and anchovies.  Bottom line: Eating fatty fish several times per week may help increase HDL cholesterol levels and provide other  benefits to heart health.  9. Avoid artificial trans fats  Artificial trans fats have many negative health effects due to their inflammatory properties (56, 57).  There are two types of trans fats. One kind occurs naturally in animal products, including full-fat dairy.  In contrast, the artificial trans fats found in margarines and processed foods are created by adding hydrogen to unsaturated vegetable and seed oils. These fats are also known as industrial trans fats or partially hydrogenated fats.  Research has shown that, in addition to increasing inflammation and contributing to several health problems, these artificial trans fats may lower HDL cholesterol levels.  In one study, researchers compared how people's HDL levels responded when they consumed different margarines.  The study found that participants' HDL cholesterol levels were 10% lower after consuming margarine containing partially hydrogenated soybean oil, compared to their levels after consuming palm oil (58).  Another controlled study followed 40 adults who had diets high in different types of trans fats.  They found that HDL cholesterol levels in women were significantly lower after they consumed the diet high in industrial trans fats, compared to the diet containing naturally occurring trans fats (59).  To protect heart health and keep HDL cholesterol in the healthy range, it's best to avoid artificial trans fats altogether.  Bottom line: Artificial trans fats have been shown to lower HDL levels and increase inflammation, compared to other fats.  Take home message  Although your HDL cholesterol levels are partly determined by your genetics, there are many things you can do to naturally increase your own levels.  Fortunately, the practices that raise HDL cholesterol often provide other health benefits as well.

## 2016-05-22 NOTE — Progress Notes (Signed)
Assessment and plan:  1. Obesity, Class I, BMI 30-34.9   2. Vitamin D insufficiency   3. Low serum HDL     Vitamin D insufficiency MVI & vit D3 2,000 IU  Obesity, Class I, BMI 30-34.9 Discussed with patient if she would like to come in sooner to discuss weight loss strategies she can.   Otherwise will see her in 6 months for her yearly physical.  Low serum HDL Discussed strategies to improve her cholesterol panel including increasing HDL and lowering LDL and triglycerides. Handouts provided. Recheck 1 year    New Prescriptions   No medications on file    Modified Medications   No medications on file    Discontinued Medications   AMOXICILLIN (AMOXIL) 875 MG TABLET    Take 1 tablet (875 mg total) by mouth 2 (two) times daily.   PREDNISONE (DELTASONE) 20 MG TABLET    Take 3 pills a day for 2 days, 2 pills a day for 2 days, 1 pill a day for 2 days then one half pill a day for 2 days then off     Return in about 6 months (around 11/20/2016) for For wellness exam & health maintenance evaluation.  Anticipatory guidance and routine counseling done re: condition, txmnt options and need for follow up. All questions of patient's were answered.   Gross side effects, risk and benefits, and alternatives of medications discussed with patient.  Patient is aware that all medications have potential side effects and we are unable to predict every sideeffect or drug-drug interaction that may occur.  Expresses verbal understanding and consents to current therapy plan and treatment regiment.  Please see AVS handed out to patient at the end of our visit for additional patient instructions/ counseling done pertaining to today's office visit.  Note: This document was prepared using Dragon voice recognition software and may include unintentional dictation  errors.   ----------------------------------------------------------------------------------------------------------------------  Subjective:   CC:   Stefanie White is a 25 y.o. female who presents to [place] at [facility] today for review and discussion of recent bloodwork that was done.  1. All recent blood work that we ordered was reviewed with patient today.  Patient was counseled on all abnormalities and we discussed dietary and lifestyle changes that could help those values (also medications when appropriate).  Extensive health counseling performed and all patient's concerns/ questions were addressed.  2.   On feet all day at work at [employer] steps---> 12- 15K at work alone.  Drinks sweet tea and soda daily.     Wt Readings from Last 3 Encounters:  05/22/16 170 lb 8 oz (77.3 kg)  03/03/16 166 lb 1.6 oz (75.3 kg)  03/01/16 171 lb 3.2 oz (77.7 kg)   BP Readings from Last 3 Encounters:  05/22/16 (!) 89/63  03/03/16 117/87  03/01/16 110/78   Pulse Readings from Last 3 Encounters:  05/22/16 83  03/03/16 (!) 109  03/01/16 (!) 108   BMI Readings from Last 3 Encounters:  05/22/16 30.20 kg/m  03/03/16 29.42 kg/m  03/01/16 30.33 kg/m     Patient Care Team    Relationship Specialty Notifications Start End  Mellody Dance, DO PCP - General Family Medicine  03/01/16   Vanessa Kick, MD Consulting Physician Obstetrics and Gynecology  03/01/16   Webb Laws, OD Referring Physician Optometry  03/01/16     Full medical history updated and reviewed in the office today  Patient Active  Problem List   Diagnosis Date Noted  . Vitamin D insufficiency 05/22/2016  . Obesity, Class I, BMI 30-34.9 05/22/2016  . Low serum HDL 05/22/2016  . Acute left otitis media 03/03/2016  . Otitis, externa, infective 03/03/2016  . Pharyngitis 03/03/2016  . Fever, unspecified 03/03/2016  . Migraine headache 03/01/2016  . History of ADHD 03/01/2016  . Environmental and  seasonal allergies 03/01/2016  . Viral URI 03/01/2016  . h/o Alopecia areata 05/22/2014    Past Medical History:  Diagnosis Date  . Alopecia     History reviewed. No pertinent surgical history.  Social History  Substance Use Topics  . Smoking status: Former Smoker    Packs/day: 0.25    Years: 1.00    Types: Cigarettes    Quit date: 06/12/2010  . Smokeless tobacco: Never Used  . Alcohol use 0.6 oz/week    1 Glasses of wine per week    Family Hx: Family History  Problem Relation Age of Onset  . Diabetes Father 26  . Hypertension Father 56  . Depression Brother 10    bipolar  . Cancer Maternal Aunt 45    colon  . Heart attack Paternal Grandmother 69     Medications: Current Outpatient Prescriptions  Medication Sig Dispense Refill  . aspirin-acetaminophen-caffeine (EXCEDRIN MIGRAINE) 250-250-65 MG tablet Take 1-2 tablets by mouth every 6 (six) hours as needed for headache.    . etonogestrel (NEXPLANON) 68 MG IMPL implant 1 each by Subdermal route once.    . fexofenadine-pseudoephedrine (ALLEGRA-D ALLERGY & CONGESTION) 180-240 MG 24 hr tablet Take 1 tablet by mouth daily. 90 tablet 1  . fluticasone (FLONASE) 50 MCG/ACT nasal spray Place 1 spray into both nostrils 2 (two) times daily. 16 g 6  . Multiple Vitamin (MULTIVITAMIN) capsule Take 1 capsule by mouth daily.     No current facility-administered medications for this visit.     Allergies:  Allergies  Allergen Reactions  . Rizatriptan Swelling    Mouth swelling     ROS: Review of Systems  Constitutional: Negative.   HENT: Negative.   Eyes: Negative.   Respiratory: Negative.   Cardiovascular: Negative.   Gastrointestinal: Negative.   Genitourinary: Negative.   Musculoskeletal: Negative.   Skin: Negative.   Neurological: Negative.   Endo/Heme/Allergies: Negative.   Psychiatric/Behavioral: Negative.     Objective:  Blood pressure (!) 89/63, pulse 83, height _0  (1.6 m), weight 170 lb 8 oz (77.3 kg),  last menstrual period 04/22/2016. Body mass index is 30.2 kg/m. Gen:   Well NAD, A and O *3 HEENT:    [place]/AT, EOMI,  MMM, OP- clr Lungs:   Normal work of breathing. CTA B/L, no Wh, rhonchi Heart:   RRR, S1, S2 WNL's, no MRG Abd:   No gross distention Exts:    warm, pink,  Brisk capillary refill, warm and well perfused.  Psych:    No HI/SI, judgement and insight good, Euthymic mood. Full Affect.   Recent Results (from the past 2160 hour(s))  POCT rapid strep A     Status: Normal   Collection Time: 03/01/16  2:10 PM  Result Value Ref Range   Rapid Strep A Screen Negative Negative  CBC with Differential/Platelet     Status: None   Collection Time: 05/15/16  8:33 AM  Result Value Ref Range   WBC 8.2 3.8 - 10.8 K/uL   RBC 4.65 3.80 - 5.10 MIL/uL   Hemoglobin 13.8 11.7 - 15.5 g/dL   HCT 41.1 35.0 -  45.0 %   MCV 88.4 80.0 - 100.0 fL   MCH 29.7 27.0 - 33.0 pg   MCHC 33.6 32.0 - 36.0 g/dL   RDW 12.9 11.0 - 15.0 %   Platelets 304 140 - 400 K/uL   MPV 9.3 7.5 - 12.5 fL   Neutro Abs 5,084 1,500 - 7,800 cells/uL   Lymphs Abs 2,296 850 - 3,900 cells/uL   Monocytes Absolute 574 200 - 950 cells/uL   Eosinophils Absolute 164 15 - 500 cells/uL   Basophils Absolute 82 0 - 200 cells/uL   Neutrophils Relative % 62 %   Lymphocytes Relative 28 %   Monocytes Relative 7 %   Eosinophils Relative 2 %   Basophils Relative 1 %   Smear Review Criteria for review not met   Comp Met (CMET)     Status: None   Collection Time: 05/15/16  8:33 AM  Result Value Ref Range   Sodium 138 135 - 146 mmol/L   Potassium 4.9 3.5 - 5.3 mmol/L   Chloride 104 98 - 110 mmol/L   CO2 25 20 - 31 mmol/L   Glucose, Bld 82 65 - 99 mg/dL   BUN 9 7 - 25 mg/dL   Creat 0.62 0.50 - 1.10 mg/dL   Total Bilirubin 0.6 0.2 - 1.2 mg/dL   Alkaline Phosphatase 76 33 - 115 U/L   AST 19 10 - 30 U/L   ALT 19 6 - 29 U/L   Total Protein 7.3 6.1 - 8.1 g/dL   Albumin 4.2 3.6 - 5.1 g/dL   Calcium 9.4 8.6 - 10.2 mg/dL  Lipid panel      Status: Abnormal   Collection Time: 05/15/16  8:33 AM  Result Value Ref Range   Cholesterol 141 <200 mg/dL    Comment: ** Please note change in reference range(s). **      Triglycerides 134 <150 mg/dL    Comment: ** Please note change in reference range(s). **      HDL 32 (L) >50 mg/dL    Comment: ** Please note change in reference range(s). **      Total CHOL/HDL Ratio 4.4 <5.0 Ratio   VLDL 27 <30 mg/dL   LDL Cholesterol 82 <100 mg/dL    Comment: ** Please note change in reference range(s). **     T4, free     Status: None   Collection Time: 05/15/16  8:33 AM  Result Value Ref Range   Free T4 1.2 0.8 - 1.8 ng/dL  TSH     Status: None   Collection Time: 05/15/16  8:33 AM  Result Value Ref Range   TSH 2.14 mIU/L    Comment:   Reference Range   > or = 20 Years  0.40-4.50   Pregnancy Range First trimester  0.26-2.66 Second trimester 0.55-2.73 Third trimester  0.43-2.91     VITAMIN D 25 Hydroxy (Vit-D Deficiency, Fractures)     Status: Abnormal   Collection Time: 05/15/16  8:33 AM  Result Value Ref Range   Vit D, 25-Hydroxy 29 (L) 30 - 100 ng/mL    Comment: Vitamin D Status           25-OH Vitamin D        Deficiency                <20 ng/mL        Insufficiency         20 - 29 ng/mL  Optimal             > or = 30 ng/mL   For 25-OH Vitamin D testing on patients on D2-supplementation and patients for whom quantitation of D2 and D3 fractions is required, the QuestAssureD 25-OH VIT D, (D2,D3), LC/MS/MS is recommended: order code [id number] [phone] (patients > 2 yrs).

## 2016-06-20 MED FILL — FLUTICASONE PROP 50 MCG SPR: 50 | 30 days supply | Qty: 16 | Fill #1

## 2016-08-29 ENCOUNTER — Emergency Department
Admission: EM | Admit: 2016-08-29 | Discharge: 2016-08-29 | Disposition: A | Discharge status: Home / Self Care | Payer: 59 | Attending: Emergency Medicine | Admitting: Emergency Medicine

## 2016-08-29 DIAGNOSIS — Z87891 Personal history of nicotine dependence: Secondary | ICD-10-CM | POA: Insufficient documentation

## 2016-08-29 DIAGNOSIS — R51 Headache: Secondary | ICD-10-CM

## 2016-08-29 DIAGNOSIS — R519 Headache, unspecified: Secondary | ICD-10-CM

## 2016-08-29 DIAGNOSIS — R112 Nausea with vomiting, unspecified: Secondary | ICD-10-CM | POA: Diagnosis not present

## 2016-08-29 DIAGNOSIS — N3001 Acute cystitis with hematuria: Secondary | ICD-10-CM | POA: Diagnosis not present

## 2016-08-29 DIAGNOSIS — Z79899 Other long term (current) drug therapy: Secondary | ICD-10-CM | POA: Insufficient documentation

## 2016-08-29 DIAGNOSIS — R6883 Chills (without fever): Secondary | ICD-10-CM

## 2016-08-29 LAB — COMPREHENSIVE METABOLIC PANEL
ALBUMIN: 4.3 g/dL (ref 3.5–5.0)
ALT: 22 U/L (ref 14–54)
AST: 23 U/L (ref 15–41)
Alkaline Phosphatase: 72 U/L (ref 38–126)
Anion gap: 7 (ref 5–15)
BUN: 11 mg/dL (ref 6–20)
CHLORIDE: 105 mmol/L (ref 101–111)
CO2: 22 mmol/L (ref 22–32)
Calcium: 8.8 mg/dL — ABNORMAL LOW (ref 8.9–10.3)
Creatinine, Ser: 0.47 mg/dL (ref 0.44–1.00)
GFR calc Af Amer: >60 mL/min (ref >60)
Glucose, Bld: 104 mg/dL — ABNORMAL HIGH (ref 65–99)
POTASSIUM: 3.9 mmol/L (ref 3.5–5.1)
SODIUM: 134 mmol/L — AB (ref 135–145)
Total Bilirubin: 0.7 mg/dL (ref 0.3–1.2)
Total Protein: 7.8 g/dL (ref 6.5–8.1)

## 2016-08-29 LAB — CBC
HCT: 39.6 % (ref 35.0–47.0)
Hemoglobin: 13.9 g/dL (ref 12.0–16.0)
MCH: 30.5 pg (ref 26.0–34.0)
MCHC: 35.2 g/dL (ref 32.0–36.0)
MCV: 86.6 fL (ref 80.0–100.0)
PLATELETS: 269 10*3/uL (ref 150–440)
RBC: 4.57 MIL/uL (ref 3.80–5.20)
RDW: 12.5 % (ref 11.5–14.5)
WBC: 13.7 10*3/uL — AB (ref 3.6–11.0)

## 2016-08-29 LAB — URINALYSIS, COMPLETE (UACMP) WITH MICROSCOPIC
Bilirubin Urine: NEGATIVE
GLUCOSE, UA: NEGATIVE mg/dL
KETONES UR: NEGATIVE mg/dL
Nitrite: NEGATIVE
Protein, ur: NEGATIVE mg/dL
Specific Gravity, Urine: 1.016 (ref 1.005–1.030)
pH: 7 (ref 5.0–8.0)

## 2016-08-29 LAB — INFLUENZA PANEL BY PCR (TYPE A & B)
Influenza A By PCR: NEGATIVE
Influenza B By PCR: NEGATIVE

## 2016-08-29 LAB — LIPASE, BLOOD: LIPASE: 21 U/L (ref 11–51)

## 2016-08-29 MED ORDER — ONDANSETRON HCL 4 MG/2ML IJ SOLN
INTRAMUSCULAR | Status: AC
  Filled 2016-08-29: qty 2

## 2016-08-29 MED ORDER — KETOROLAC TROMETHAMINE 30 MG/ML IJ SOLN
30.0000 mg | Freq: Once | INTRAMUSCULAR | Status: AC
  Administered 2016-08-29: 30 mg via INTRAVENOUS
  Filled 2016-08-29: qty 1

## 2016-08-29 MED ORDER — SODIUM CHLORIDE 0.9 % IV BOLUS (SEPSIS)
1000.0000 mL | Freq: Once | INTRAVENOUS | Status: AC
  Administered 2016-08-29: 1000 mL via INTRAVENOUS

## 2016-08-29 MED ORDER — ONDANSETRON HCL 4 MG/2ML IJ SOLN
4.0000 mg | Freq: Once | INTRAMUSCULAR | Status: AC
  Administered 2016-08-29: 4 mg via INTRAVENOUS

## 2016-08-29 MED ORDER — ONDANSETRON 4 MG PO TBDP: 4.0000 mg | ORAL_TABLET | Freq: Three times a day (TID) | ORAL | 0 refills | Status: DC | PRN

## 2016-08-29 MED ORDER — CEPHALEXIN 500 MG PO CAPS: 500.0000 mg | ORAL_CAPSULE | Freq: Three times a day (TID) | ORAL | 0 refills | Status: DC

## 2016-08-29 MED ORDER — METOCLOPRAMIDE HCL 5 MG/ML IJ SOLN
10.0000 mg | Freq: Once | INTRAMUSCULAR | Status: AC
  Administered 2016-08-29: 10 mg via INTRAVENOUS
  Filled 2016-08-29: qty 2

## 2016-08-29 MED FILL — ONDANSETRON ODT 4 MG TABLET: 4 | 7 days supply | Qty: 20 | Fill #0

## 2016-08-29 MED FILL — CEPHALEXIN 500 MG CAPSULE: 500 | 5 days supply | Qty: 15 | Fill #0

## 2016-08-29 NOTE — Discharge Instructions (Signed)
These follow-up with her primary care physician for further evaluation.

## 2016-08-29 NOTE — ED Provider Notes (Signed)
[facility] Emergency Department Provider Note   ____________________________________________   First MD Initiated Contact with Patient 08/29/16 [phone]     (approximate)  I have reviewed the triage vital signs and the nursing notes.   HISTORY  Chief Complaint Emesis    HPI Stefanie White is a 26 y.o. female who comes into the hospital today with some headache and chills. The patient reports that it developed around 8:30 or 9 PM. She took 400 mg of ibuprofen. The patient went to bed [place] seemed worse. By 11:30 she was having sweats and then developed some nausea and vomiting. The patient reports that the vomiting started around 2 AM. The patient has had some body cramps and fatigue. She's vomited about 2 times. She reports that her emesis looked yellow but had no blood in it. The patient reports that whenever she tried to drink water she has stomach cramping. Her pain is improved at this time with no abdominal pain but she does have a headache that she rates a 4 out of 10 in intensity. The patient has not had any diarrhea. She is here in the hospital today for evaluation and treatment of her nausea and vomiting.   Past Medical History:  Diagnosis Date  . Alopecia     Patient Active Problem List   Diagnosis Date Noted  . Vitamin D insufficiency 05/22/2016  . Obesity, Class I, BMI 30-34.9 05/22/2016  . Low serum HDL 05/22/2016  . Acute left otitis media 03/03/2016  . Otitis, externa, infective 03/03/2016  . Pharyngitis 03/03/2016  . Fever, unspecified 03/03/2016  . Migraine headache 03/01/2016  . History of ADHD 03/01/2016  . Environmental and seasonal allergies 03/01/2016  . Viral URI 03/01/2016  . h/o Alopecia areata 05/22/2014    No past surgical history on file.  Prior to Admission medications   Medication Sig Start Date End Date Taking? Authorizing Provider  aspirin-acetaminophen-caffeine (EXCEDRIN MIGRAINE) [phone] MG tablet Take 1-2  tablets by mouth every 6 (six) hours as needed for headache.    Historical Provider, MD  cephALEXin (KEFLEX) 500 MG capsule Take 1 capsule (500 mg total) by mouth 3 (three) times daily. 08/29/16   Rebecka Apley, MD  etonogestrel (NEXPLANON) 68 MG IMPL implant 1 each by Subdermal route once.    Historical Provider, MD  fexofenadine-pseudoephedrine (ALLEGRA-D ALLERGY & CONGESTION) 180-240 MG 24 hr tablet Take 1 tablet by mouth daily. 03/01/16   Deborah Opalski, DO  fluticasone (FLONASE) 50 MCG/ACT nasal spray Place 1 spray into both nostrils 2 (two) times daily. 03/01/16   Thomasene Lot, DO  Multiple Vitamin (MULTIVITAMIN) capsule Take 1 capsule by mouth daily.    Historical Provider, MD  ondansetron (ZOFRAN ODT) 4 MG disintegrating tablet Take 1 tablet (4 mg total) by mouth every 8 (eight) hours as needed for nausea or vomiting. 08/29/16   Rebecka Apley, MD    Allergies Rizatriptan  Family History  Problem Relation Age of Onset  . Diabetes Father 64  . Hypertension Father 77  . Depression Brother 10    bipolar  . Cancer Maternal Aunt 45    colon  . Heart attack Paternal Grandmother 63    Social History Social History  Substance Use Topics  . Smoking status: Former Smoker    Packs/day: 0.25    Years: 1.00    Types: Cigarettes    Quit date: 06/12/2010  . Smokeless tobacco: Never Used  . Alcohol use 0.6 oz/week    1 Glasses  of wine per week    Review of Systems Constitutional: chills Eyes: No visual changes. ENT: No sore throat. Cardiovascular: Denies chest pain. Respiratory: Denies shortness of breath. Gastrointestinal:  abdominal pain.   nausea,  vomiting.  No diarrhea.  No constipation. Genitourinary: Negative for dysuria. Musculoskeletal: Negative for back pain. Skin: Negative for rash. Neurological: headache  10-point ROS otherwise negative.  ____________________________________________   PHYSICAL EXAM:  VITAL SIGNS: ED Triage Vitals  Enc Vitals Group      BP 08/29/16 0329 (!) 130/97     Pulse Rate 08/29/16 0329 (!) 122     Resp 08/29/16 0329 18     Temp 08/29/16 0329 98.9 F (37.2 C)     Temp Source 08/29/16 0329 Oral     SpO2 08/29/16 0329 96 %     Weight 08/29/16 0329 170 lb (77.1 kg)     Height 08/29/16 0329 5\' 3"  (1.6 m)     Head Circumference --      Peak Flow --      Pain Score 08/29/16 0330 5     Pain Loc --      Pain Edu? --      Excl. in GC? --     Constitutional: Alert and oriented. Well appearing and in mild distress. Eyes: Conjunctivae are normal. PERRL. EOMI. Head: Atraumatic. Nose: No congestion/rhinnorhea. Mouth/Throat: Mucous membranes are moist.  Oropharynx non-erythematous. Cardiovascular: Normal rate, regular rhythm. Grossly normal heart sounds.  Good peripheral circulation. Respiratory: Normal respiratory effort.  No retractions. Lungs CTAB. Gastrointestinal: Soft and nontender. No distention. Positive bowel sounds Musculoskeletal: No lower extremity tenderness nor edema.   Neurologic:  Normal speech and language. Positive bowel sounds Skin:  Skin is warm, dry and intact.  Psychiatric: Mood and affect are normal.   ____________________________________________   LABS (all labs ordered are listed, but only abnormal results are displayed)  Labs Reviewed  CBC - Abnormal; Notable for the following:       Result Value   WBC 13.7 (*)    All other components within normal limits  COMPREHENSIVE METABOLIC PANEL - Abnormal; Notable for the following:    Sodium 134 (*)    Glucose, Bld 104 (*)    Calcium 8.8 (*)    All other components within normal limits  URINALYSIS, COMPLETE (UACMP) WITH MICROSCOPIC - Abnormal; Notable for the following:    Color, Urine YELLOW (*)    APPearance HAZY (*)    Hgb urine dipstick LARGE (*)    Leukocytes, UA LARGE (*)    Bacteria, UA RARE (*)    Squamous Epithelial / LPF 6-30 (*)    All other components within normal limits  URINE CULTURE  LIPASE, BLOOD  INFLUENZA PANEL BY  PCR (TYPE A & B)  POC URINE PREG, ED   ____________________________________________  EKG  none ____________________________________________  RADIOLOGY  none ____________________________________________   PROCEDURES  Procedure(s) performed: None  Procedures  Critical Care performed: No  ____________________________________________   INITIAL IMPRESSION / ASSESSMENT AND PLAN / ED COURSE  Pertinent labs & imaging results that were available during my care of the patient were reviewed by me and considered in my medical decision making (see chart for details).  I did check a flu swab on the patient given her symptoms. The patient does not have the flu. She has a mildly elevated white blood cell count as well as some hemoglobin in her urine and some rare bacteria with 6-30 white blood cells. I did give the patient  some Reglan, Benadryl and Toradol as well as some fluids and her headache improved. The patient is able to take by mouth here in the emergency department. I will treat her for urinary tract infection and also send a urine culture. I will have the patient follow back up with her primary care physician. The patient has no further questions or concerns at this time. She'll be discharged home.      ____________________________________________   FINAL CLINICAL IMPRESSION(S) / ED DIAGNOSES  Final diagnoses:  Non-intractable vomiting with nausea, unspecified vomiting type  Chills  Nonintractable headache, unspecified chronicity pattern, unspecified headache type  Acute cystitis with hematuria      NEW MEDICATIONS STARTED DURING THIS VISIT:  New Prescriptions   CEPHALEXIN (KEFLEX) 500 MG CAPSULE    Take 1 capsule (500 mg total) by mouth 3 (three) times daily.   ONDANSETRON (ZOFRAN ODT) 4 MG DISINTEGRATING TABLET    Take 1 tablet (4 mg total) by mouth every 8 (eight) hours as needed for nausea or vomiting.     Note:  This document was prepared using Dragon voice  recognition software and may include unintentional dictation errors.    Rebecka ApleyAllison P Brek Reece, MD 08/29/16 [phone]

## 2016-08-29 NOTE — ED Triage Notes (Signed)
Pt in with co abd cramping, headache and vomited x 2 tonight. States believes she had a fever she had chills and body aches.

## 2016-08-29 NOTE — ED Notes (Signed)
Pt drinking gingerale for PO challenge, will continue to monitor the pt.

## 2016-08-29 NOTE — ED Notes (Signed)
URINE PREG. POC= Negative

## 2016-08-29 NOTE — ED Notes (Signed)
Pt denies nausea, states her HA is gone/No pain. Pt is awaiting EDP to review results..Stefanie White

## 2016-08-30 LAB — URINE CULTURE

## 2016-09-25 MED FILL — FLUTICASONE PROP 50 MCG SPR: 50 | 30 days supply | Qty: 16 | Fill #2

## 2016-11-10 ENCOUNTER — Ambulatory Visit (INDEPENDENT_AMBULATORY_CARE_PROVIDER_SITE_OTHER): Payer: BLUE CROSS/BLUE SHIELD | Admitting: Family Medicine

## 2016-11-10 ENCOUNTER — Encounter: Payer: Self-pay | Admitting: Family Medicine

## 2016-11-10 VITALS — BP 119/72 | HR 69 | Ht 63.0 in | Wt 170.0 lb

## 2016-11-10 DIAGNOSIS — Z1389 Encounter for screening for other disorder: Secondary | ICD-10-CM

## 2016-11-10 DIAGNOSIS — Z7189 Other specified counseling: Secondary | ICD-10-CM

## 2016-11-10 DIAGNOSIS — Z0000 Encounter for general adult medical examination without abnormal findings: Secondary | ICD-10-CM

## 2016-11-10 NOTE — Patient Instructions (Signed)
Please find out when you had your last Tetanus shot was and update [place].  Also find out about that blood work we discussed to update your records.  Preventive Care for Adults, Female  A healthy lifestyle and preventive care can promote health and wellness. Preventive health guidelines for women include the following key practices.   A routine yearly physical is a good way to check with your health care provider about your health and preventive screening. It is a chance to share any concerns and updates on your health and to receive a thorough exam.   Visit your dentist for a routine exam and preventive care every 6 months. Brush your teeth twice a day and floss once a day. Good oral hygiene prevents tooth decay and gum disease.   The frequency of eye exams is based on your age, health, family medical history, use of contact lenses, and other factors. Follow your health care provider's recommendations for frequency of eye exams.   Eat a healthy diet. Foods like vegetables, fruits, whole grains, low-fat dairy products, and lean protein foods contain the nutrients you need without too many calories. Decrease your intake of foods high in solid fats, added sugars, and salt. Eat the right amount of calories for you.Get information about a proper diet from your health care provider, if necessary.   Regular physical exercise is one of the most important things you can do for your health. Most adults should get at least 150 minutes of moderate-intensity exercise (any activity that increases your heart rate and causes you to sweat) each week. In addition, most adults need muscle-strengthening exercises on 2 or more days a week.   Maintain a healthy weight. The body mass index (BMI) is a screening tool to identify possible weight problems. It provides an estimate of body fat based on height and weight. Your health care provider can find your BMI, and can help you achieve or maintain a healthy  weight.For adults 20 years and older:   - A BMI below 18.5 is considered underweight.   - A BMI of 18.5 to 24.9 is normal.   - A BMI of 25 to 29.9 is considered overweight.   - A BMI of 30 and above is considered obese.   Maintain normal blood lipids and cholesterol levels by exercising and minimizing your intake of trans and saturated fats.  Eat a balanced diet with plenty of fruit and vegetables. Blood tests for lipids and cholesterol should begin at age 91 and be repeated every 5 years minimum.  If your lipid or cholesterol levels are high, you are over 40, or you are at high risk for heart disease, you may need your cholesterol levels checked more frequently.Ongoing high lipid and cholesterol levels should be treated with medicines if diet and exercise are not working.   If you smoke, find out from your health care provider how to quit. If you do not use tobacco, do not start.   Lung cancer screening is recommended for adults aged 20-80 years who are at high risk for developing lung cancer because of a history of smoking. A yearly low-dose CT scan of the lungs is recommended for people who have at least a 30-pack-year history of smoking and are a current smoker or have quit within the past 15 years. A pack year of smoking is smoking an average of 1 pack of cigarettes a day for 1 year (for example: 1 pack a day for 30 years or  2 packs a day for 15 years). Yearly screening should continue until the smoker has stopped smoking for at least 15 years. Yearly screening should be stopped for people who develop a health problem that would prevent them from having lung cancer treatment.   If you are pregnant, do not drink alcohol. If you are breastfeeding, be very cautious about drinking alcohol. If you are not pregnant and choose to drink alcohol, do not have more than 1 drink per day. One drink is considered to be 12 ounces (355 mL) of beer, 5 ounces (148 mL) of wine, or 1.5 ounces (44 mL) of  liquor.   Avoid use of street drugs. Do not share needles with anyone. Ask for help if you need support or instructions about stopping the use of drugs.   High blood pressure causes heart disease and increases the risk of stroke. Your blood pressure should be checked at least yearly.  Ongoing high blood pressure should be treated with medicines if weight loss and exercise do not work.   If you are 38-9 years old, ask your health care provider if you should take aspirin to prevent strokes.   Diabetes screening involves taking a blood sample to check your fasting blood sugar level. This should be done once every 3 years, after age 19, if you are within normal weight and without risk factors for diabetes. Testing should be considered at a younger age or be carried out more frequently if you are overweight and have at least 1 risk factor for diabetes.   Breast cancer screening is essential preventive care for women. You should practice "breast self-awareness."  This means understanding the normal appearance and feel of your breasts and may include breast self-examination.  Any changes detected, no matter how small, should be reported to a health care provider.  Women in their 23s and 30s should have a clinical breast exam (CBE) by a health care provider as part of a regular health exam every 1 to 3 years.  After age 42, women should have a CBE every year.  Starting at age 64, women should consider having a mammogram (breast X-ray test) every year.  Women who have a family history of breast cancer should talk to their health care provider about genetic screening.  Women at a high risk of breast cancer should talk to their health care providers about having an MRI and a mammogram every year.   -Breast cancer gene (BRCA)-related cancer risk assessment is recommended for women who have family members with BRCA-related cancers. BRCA-related cancers include breast, ovarian, tubal, and peritoneal cancers.  Having family members with these cancers may be associated with an increased risk for harmful changes (mutations) in the breast cancer genes BRCA1 and BRCA2. Results of the assessment will determine the need for genetic counseling and BRCA1 and BRCA2 testing.   The Pap test is a screening test for cervical cancer. A Pap test can show cell changes on the cervix that might become cervical cancer if left untreated. A Pap test is a procedure in which cells are obtained and examined from the lower end of the uterus (cervix).   - Women should have a Pap test starting at age 40.   - Between ages 80 and 3, Pap tests should be repeated every 2 years.   - Beginning at age 60, you should have a Pap test every 3 years as long as the past 3 Pap tests have been normal.   - Some women have  medical problems that increase the chance of getting cervical cancer. Talk to your health care provider about these problems. It is especially important to talk to your health care provider if a new problem develops soon after your last Pap test. In these cases, your health care provider may recommend more frequent screening and Pap tests.   - The above recommendations are the same for women who have or have not gotten the vaccine for human papillomavirus (HPV).   - If you had a hysterectomy for a problem that was not cancer or a condition that could lead to cancer, then you no longer need Pap tests. Even if you no longer need a Pap test, a regular exam is a good idea to make sure no other problems are starting.   - If you are between ages 32 and 90 years, and you have had normal Pap tests going back 10 years, you no longer need Pap tests. Even if you no longer need a Pap test, a regular exam is a good idea to make sure no other problems are starting.   - If you have had past treatment for cervical cancer or a condition that could lead to cancer, you need Pap tests and screening for cancer for at least 20 years after your  treatment.   - If Pap tests have been discontinued, risk factors (such as a new sexual partner) need to be reassessed to determine if screening should be resumed.   - The HPV test is an additional test that may be used for cervical cancer screening. The HPV test looks for the virus that can cause the cell changes on the cervix. The cells collected during the Pap test can be tested for HPV. The HPV test could be used to screen women aged 61 years and older, and should be used in women of any age who have unclear Pap test results. After the age of 1, women should have HPV testing at the same frequency as a Pap test.   Colorectal cancer can be detected and often prevented. Most routine colorectal cancer screening begins at the age of 8 years and continues through age 35 years. However, your health care provider may recommend screening at an earlier age if you have risk factors for colon cancer. On a yearly basis, your health care provider may provide home test kits to check for hidden blood in the stool.  Use of a small camera at the end of a tube, to directly examine the colon (sigmoidoscopy or colonoscopy), can detect the earliest forms of colorectal cancer. Talk to your health care provider about this at age 30, when routine screening begins. Direct exam of the colon should be repeated every 5 -10 years through age 35 years, unless early forms of pre-cancerous polyps or small growths are found.   People who are at an increased risk for hepatitis B should be screened for this virus. You are considered at high risk for hepatitis B if:  -You were born in a country where hepatitis B occurs often. Talk with your health care provider about which countries are considered high risk.  - Your parents were born in a high-risk country and you have not received a shot to protect against hepatitis B (hepatitis B vaccine).  - You have HIV or AIDS.  - You use needles to inject street drugs.  - You live with, or  have sex with, someone who has Hepatitis B.  - You get hemodialysis treatment.  -  You take certain medicines for conditions like cancer, organ transplantation, and autoimmune conditions.   Hepatitis C blood testing is recommended for all people born from [birth year] through 1965 and any individual with known risks for hepatitis C.   Practice safe sex. Use condoms and avoid high-risk sexual practices to reduce the spread of sexually transmitted infections (STIs). STIs include gonorrhea, chlamydia, syphilis, trichomonas, herpes, HPV, and human immunodeficiency virus (HIV). Herpes, HIV, and HPV are viral illnesses that have no cure. They can result in disability, cancer, and death. Sexually active women aged 38 years and younger should be checked for chlamydia. Older women with new or multiple partners should also be tested for chlamydia. Testing for other STIs is recommended if you are sexually active and at increased risk.   Osteoporosis is a disease in which the bones lose minerals and strength with aging. This can result in serious bone fractures or breaks. The risk of osteoporosis can be identified using a bone density scan. Women ages 46 years and over and women at risk for fractures or osteoporosis should discuss screening with their health care providers. Ask your health care provider whether you should take a calcium supplement or vitamin D to There are also several preventive steps women can take to avoid osteoporosis and resulting fractures or to keep osteoporosis from worsening. -->Recommendations include:  Eat a balanced diet high in fruits, vegetables, calcium, and vitamins.  Get enough calcium. The recommended total intake of is 1,200 mg daily; for best absorption, if taking supplements, divide doses into 250-500 mg doses throughout the day. Of the two types of calcium, calcium carbonate is best absorbed when taken with food but calcium citrate can be taken on an empty stomach.  Get enough  vitamin D. [employer] and the [place] recommend at least 1,000 IU per day for women age 11 and over who are at risk of vitamin D deficiency. Vitamin D deficiency can be caused by inadequate sun exposure (for example, those who live in [place]).  Avoid alcohol and smoking. Heavy alcohol intake (more than 7 drinks per week) increases the risk of falls and hip fracture and women smokers tend to lose bone more rapidly and have lower bone mass than nonsmokers. Stopping smoking is one of the most important changes women can make to improve their health and decrease risk for disease.  Be physically active every day. Weight-bearing exercise (for example, fast walking, hiking, jogging, and weight training) may strengthen bones or slow the rate of bone loss that comes with aging. Balancing and muscle-strengthening exercises can reduce the risk of falling and fracture.  Consider therapeutic medications. Currently, several types of effective drugs are available. Healthcare providers can recommend the type most appropriate for each woman.  Eliminate environmental factors that may contribute to accidents. Falls cause nearly 90% of all osteoporotic fractures, so reducing this risk is an important bone-health strategy. Measures include ample lighting, removing obstructions to walking, using nonskid rugs on floors, and placing mats and/or grab bars in showers.  Be aware of medication side effects. Some common medicines make bones weaker. These include a type of steroid drug called glucocorticoids used for arthritis and asthma, some antiseizure drugs, certain sleeping pills, treatments for endometriosis, and some cancer drugs. An overactive thyroid gland or using too much thyroid hormone for an underactive thyroid can also be a problem. If you are taking these medicines, talk to your doctor about what you can do to help protect your bones.reduce the rate of  osteoporosis.    Menopause can be  associated with physical symptoms and risks. Hormone replacement therapy is available to decrease symptoms and risks. You should talk to your health care provider about whether hormone replacement therapy is right for you.   Use sunscreen. Apply sunscreen liberally and repeatedly throughout the day. You should seek shade when your shadow is shorter than you. Protect yourself by wearing long sleeves, pants, a wide-brimmed hat, and sunglasses year round, whenever you are outdoors.   Once a month, do a whole body skin exam, using a mirror to look at the skin on your back. Tell your health care provider of new moles, moles that have irregular borders, moles that are larger than a pencil eraser, or moles that have changed in shape or color.   -Stay current with required vaccines (immunizations).   Influenza vaccine. All adults should be immunized every year.  Tetanus, diphtheria, and acellular pertussis (Td, Tdap) vaccine. Pregnant women should receive 1 dose of Tdap vaccine during each pregnancy. The dose should be obtained regardless of the length of time since the last dose. Immunization is preferred during the 27th 36th week of gestation. An adult who has not previously received Tdap or who does not know her vaccine status should receive 1 dose of Tdap. This initial dose should be followed by tetanus and diphtheria toxoids (Td) booster doses every 10 years. Adults with an unknown or incomplete history of completing a 3-dose immunization series with Td-containing vaccines should begin or complete a primary immunization series including a Tdap dose. Adults should receive a Td booster every 10 years.  Varicella vaccine. An adult without evidence of immunity to varicella should receive 2 doses or a second dose if she has previously received 1 dose. Pregnant females who do not have evidence of immunity should receive the first dose after pregnancy. This first dose should be obtained before leaving the  health care facility. The second dose should be obtained 4 8 weeks after the first dose.  Human papillomavirus (HPV) vaccine. Females aged 44 26 years who have not received the vaccine previously should obtain the 3-dose series. The vaccine is not recommended for use in pregnant females. However, pregnancy testing is not needed before receiving a dose. If a female is found to be pregnant after receiving a dose, no treatment is needed. In that case, the remaining doses should be delayed until after the pregnancy. Immunization is recommended for any person with an immunocompromised condition through the age of 86 years if she did not get any or all doses earlier. During the 3-dose series, the second dose should be obtained 4 8 weeks after the first dose. The third dose should be obtained 24 weeks after the first dose and 16 weeks after the second dose.  Zoster vaccine. One dose is recommended for adults aged 11 years or older unless certain conditions are present.  Measles, mumps, and rubella (MMR) vaccine. Adults born before 10 generally are considered immune to measles and mumps. Adults born in [birth year] or later should have 1 or more doses of MMR vaccine unless there is a contraindication to the vaccine or there is laboratory evidence of immunity to each of the three diseases. A routine second dose of MMR vaccine should be obtained at least 28 days after the first dose for students attending postsecondary schools, health care workers, or international travelers. People who received inactivated measles vaccine or an unknown type of measles vaccine during 1963 1967 should receive 2 doses of  MMR vaccine. People who received inactivated mumps vaccine or an unknown type of mumps vaccine before 1979 and are at high risk for mumps infection should consider immunization with 2 doses of MMR vaccine. For females of childbearing age, rubella immunity should be determined. If there is no evidence of immunity, females who  are not pregnant should be vaccinated. If there is no evidence of immunity, females who are pregnant should delay immunization until after pregnancy. Unvaccinated health care workers born before [birth year] who lack laboratory evidence of measles, mumps, or rubella immunity or laboratory confirmation of disease should consider measles and mumps immunization with 2 doses of MMR vaccine or rubella immunization with 1 dose of MMR vaccine.  Pneumococcal 13-valent conjugate (PCV13) vaccine. When indicated, a person who is uncertain of her immunization history and has no record of immunization should receive the PCV13 vaccine. An adult aged 88 years or older who has certain medical conditions and has not been previously immunized should receive 1 dose of PCV13 vaccine. This PCV13 should be followed with a dose of pneumococcal polysaccharide (PPSV23) vaccine. The PPSV23 vaccine dose should be obtained at least 8 weeks after the dose of PCV13 vaccine. An adult aged 40 years or older who has certain medical conditions and previously received 1 or more doses of PPSV23 vaccine should receive 1 dose of PCV13. The PCV13 vaccine dose should be obtained 1 or more years after the last PPSV23 vaccine dose.  Pneumococcal polysaccharide (PPSV23) vaccine. When PCV13 is also indicated, PCV13 should be obtained first. All adults aged 5 years and older should be immunized. An adult younger than age 82 years who has certain medical conditions should be immunized. Any person who resides in a nursing home or long-term care facility should be immunized. An adult smoker should be immunized. People with an immunocompromised condition and certain other conditions should receive both PCV13 and PPSV23 vaccines. People with human immunodeficiency virus (HIV) infection should be immunized as soon as possible after diagnosis. Immunization during chemotherapy or radiation therapy should be avoided. Routine use of PPSV23 vaccine is not recommended for  American Indians, [place] Natives, or people younger than 65 years unless there are medical conditions that require PPSV23 vaccine. When indicated, people who have unknown immunization and have no record of immunization should receive PPSV23 vaccine. One-time revaccination 5 years after the first dose of PPSV23 is recommended for people aged 36 64 years who have chronic kidney failure, nephrotic syndrome, asplenia, or immunocompromised conditions. People who received 1 2 doses of PPSV23 before age 33 years should receive another dose of PPSV23 vaccine at age 26 years or later if at least 5 years have passed since the previous dose. Doses of PPSV23 are not needed for people immunized with PPSV23 at or after age 30 years.  Meningococcal vaccine. Adults with asplenia or persistent complement component deficiencies should receive 2 doses of quadrivalent meningococcal conjugate (MenACWY-D) vaccine. The doses should be obtained at least 2 months apart. Microbiologists working with certain meningococcal bacteria, [employer] recruits, people at risk during an outbreak, and people who travel to or live in countries with a high rate of meningitis should be immunized. A first-year college student up through age 71 years who is living in a residence hall should receive a dose if she did not receive a dose on or after her 16th birthday. Adults who have certain high-risk conditions should receive one or more doses of vaccine.  Hepatitis A vaccine. Adults who wish to be protected from this  disease, have certain high-risk conditions, work with hepatitis A-infected animals, work in hepatitis A research labs, or travel to or work in countries with a high rate of hepatitis A should be immunized. Adults who were previously unvaccinated and who anticipate close contact with an international adoptee during the first 60 days after arrival in the [place] from a country with a high rate of hepatitis A should be  immunized.  Hepatitis B vaccine.  Adults who wish to be protected from this disease, have certain high-risk conditions, may be exposed to blood or other infectious body fluids, are household contacts or sex partners of hepatitis B positive people, are clients or workers in certain care facilities, or travel to or work in countries with a high rate of hepatitis B should be immunized.  Haemophilus influenzae type b (Hib) vaccine. A previously unvaccinated person with asplenia or sickle cell disease or having a scheduled splenectomy should receive 1 dose of Hib vaccine. Regardless of previous immunization, a recipient of a hematopoietic stem cell transplant should receive a 3-dose series 6 12 months after her successful transplant. Hib vaccine is not recommended for adults with HIV infection.  Preventive Services / Frequency Ages 59 to 39years  Blood pressure check.** / Every 1 to 2 years.  Lipid and cholesterol check.** / Every 5 years beginning at age 58.  Clinical breast exam.** / Every 3 years for women in their 37s and 89s.  BRCA-related cancer risk assessment.** / For women who have family members with a BRCA-related cancer (breast, ovarian, tubal, or peritoneal cancers).  Pap test.** / Every 2 years from ages 62 through 33. Every 3 years starting at age 63 through age 5 or 20 with a history of 3 consecutive normal Pap tests.  HPV screening.** / Every 3 years from ages 72 through ages 53 to 51 with a history of 3 consecutive normal Pap tests.  Hepatitis C blood test.** / For any individual with known risks for hepatitis C.  Skin self-exam. / Monthly.  Influenza vaccine. / Every year.  Tetanus, diphtheria, and acellular pertussis (Tdap, Td) vaccine.** / Consult your health care provider. Pregnant women should receive 1 dose of Tdap vaccine during each pregnancy. 1 dose of Td every 10 years.  Varicella vaccine.** / Consult your health care provider. Pregnant females who do not have  evidence of immunity should receive the first dose after pregnancy.  HPV vaccine. / 3 doses over 6 months, if 64 and younger. The vaccine is not recommended for use in pregnant females. However, pregnancy testing is not needed before receiving a dose.  Measles, mumps, rubella (MMR) vaccine.** / You need at least 1 dose of MMR if you were born in 1957 or later. You may also need a 2nd dose. For females of childbearing age, rubella immunity should be determined. If there is no evidence of immunity, females who are not pregnant should be vaccinated. If there is no evidence of immunity, females who are pregnant should delay immunization until after pregnancy.  Pneumococcal 13-valent conjugate (PCV13) vaccine.** / Consult your health care provider.  Pneumococcal polysaccharide (PPSV23) vaccine.** / 1 to 2 doses if you smoke cigarettes or if you have certain conditions.  Meningococcal vaccine.** / 1 dose if you are age 21 to 48 years and a Market researcher living in a residence hall, or have one of several medical conditions, you need to get vaccinated against meningococcal disease. You may also need additional booster doses.  Hepatitis A vaccine.** / Consult  your health care provider.  Hepatitis B vaccine.** / Consult your health care provider.  Haemophilus influenzae type b (Hib) vaccine.** / Consult your health care provider.  Ages 37 to 64years  Blood pressure check.** / Every 1 to 2 years.  Lipid and cholesterol check.** / Every 5 years beginning at age 55 years.  Lung cancer screening. / Every year if you are aged 67 80 years and have a 30-pack-year history of smoking and currently smoke or have quit within the past 15 years. Yearly screening is stopped once you have quit smoking for at least 15 years or develop a health problem that would prevent you from having lung cancer treatment.  Clinical breast exam.** / Every year after age 31 years.  BRCA-related cancer risk  assessment.** / For women who have family members with a BRCA-related cancer (breast, ovarian, tubal, or peritoneal cancers).  Mammogram.** / Every year beginning at age 79 years and continuing for as long as you are in good health. Consult with your health care provider.  Pap test.** / Every 3 years starting at age 56 years through age 31 or 22 years with a history of 3 consecutive normal Pap tests.  HPV screening.** / Every 3 years from ages 59 years through ages 43 to 54 years with a history of 3 consecutive normal Pap tests.  Fecal occult blood test (FOBT) of stool. / Every year beginning at age 45 years and continuing until age 65 years. You may not need to do this test if you get a colonoscopy every 10 years.  Flexible sigmoidoscopy or colonoscopy.** / Every 5 years for a flexible sigmoidoscopy or every 10 years for a colonoscopy beginning at age 49 years and continuing until age 74 years.  Hepatitis C blood test.** / For all people born from [birth year] through 1964 and any individual with known risks for hepatitis C.  Skin self-exam. / Monthly.  Influenza vaccine. / Every year.  Tetanus, diphtheria, and acellular pertussis (Tdap/Td) vaccine.** / Consult your health care provider. Pregnant women should receive 1 dose of Tdap vaccine during each pregnancy. 1 dose of Td every 10 years.  Varicella vaccine.** / Consult your health care provider. Pregnant females who do not have evidence of immunity should receive the first dose after pregnancy.  Zoster vaccine.** / 1 dose for adults aged 74 years or older.  Measles, mumps, rubella (MMR) vaccine.** / You need at least 1 dose of MMR if you were born in 1957 or later. You may also need a 2nd dose. For females of childbearing age, rubella immunity should be determined. If there is no evidence of immunity, females who are not pregnant should be vaccinated. If there is no evidence of immunity, females who are pregnant should delay immunization until  after pregnancy.  Pneumococcal 13-valent conjugate (PCV13) vaccine.** / Consult your health care provider.  Pneumococcal polysaccharide (PPSV23) vaccine.** / 1 to 2 doses if you smoke cigarettes or if you have certain conditions.  Meningococcal vaccine.** / Consult your health care provider.  Hepatitis A vaccine.** / Consult your health care provider.  Hepatitis B vaccine.** / Consult your health care provider.  Haemophilus influenzae type b (Hib) vaccine.** / Consult your health care provider.  Ages 49 years and over  Blood pressure check.** / Every 1 to 2 years.  Lipid and cholesterol check.** / Every 5 years beginning at age 21 years.  Lung cancer screening. / Every year if you are aged 69 80 years and have a 30-pack-year history  of smoking and currently smoke or have quit within the past 15 years. Yearly screening is stopped once you have quit smoking for at least 15 years or develop a health problem that would prevent you from having lung cancer treatment.  Clinical breast exam.** / Every year after age 40 years.  BRCA-related cancer risk assessment.** / For women who have family members with a BRCA-related cancer (breast, ovarian, tubal, or peritoneal cancers).  Mammogram.** / Every year beginning at age 68 years and continuing for as long as you are in good health. Consult with your health care provider.  Pap test.** / Every 3 years starting at age 47 years through age 20 or 52 years with 3 consecutive normal Pap tests. Testing can be stopped between 65 and 70 years with 3 consecutive normal Pap tests and no abnormal Pap or HPV tests in the past 10 years.  HPV screening.** / Every 3 years from ages 42 years through ages 25 or 20 years with a history of 3 consecutive normal Pap tests. Testing can be stopped between 65 and 70 years with 3 consecutive normal Pap tests and no abnormal Pap or HPV tests in the past 10 years.  Fecal occult blood test (FOBT) of stool. / Every year  beginning at age 73 years and continuing until age 110 years. You may not need to do this test if you get a colonoscopy every 10 years.  Flexible sigmoidoscopy or colonoscopy.** / Every 5 years for a flexible sigmoidoscopy or every 10 years for a colonoscopy beginning at age 49 years and continuing until age 54 years.  Hepatitis C blood test.** / For all people born from [birth year] through 1964 and any individual with known risks for hepatitis C.  Osteoporosis screening.** / A one-time screening for women ages 61 years and over and women at risk for fractures or osteoporosis.  Skin self-exam. / Monthly.  Influenza vaccine. / Every year.  Tetanus, diphtheria, and acellular pertussis (Tdap/Td) vaccine.** / 1 dose of Td every 10 years.  Varicella vaccine.** / Consult your health care provider.  Zoster vaccine.** / 1 dose for adults aged 62 years or older.  Pneumococcal 13-valent conjugate (PCV13) vaccine.** / Consult your health care provider.  Pneumococcal polysaccharide (PPSV23) vaccine.** / 1 dose for all adults aged 49 years and older.  Meningococcal vaccine.** / Consult your health care provider.  Hepatitis A vaccine.** / Consult your health care provider.  Hepatitis B vaccine.** / Consult your health care provider.  Haemophilus influenzae type b (Hib) vaccine.** / Consult your health care provider. ** Family history and personal history of risk and conditions may change your health care provider's recommendations. Document Released: 07/25/2001 Document Revised: 03/19/2013  [facility] Patient Information 2014 [place], [place].   EXERCISE AND DIET:  We recommended that you start or continue a regular exercise program for good health. Regular exercise means any activity that makes your heart beat faster and makes you sweat.  We recommend exercising at least 30 minutes per day at least 3 days a week, preferably 5.  We also recommend a diet low in fat and sugar / carbohydrates.  Inactivity,  poor dietary choices and obesity can cause diabetes, heart attack, stroke, and kidney damage, among others.     ALCOHOL AND SMOKING:  Women should limit their alcohol intake to no more than 7 drinks/beers/glasses of wine (combined, not each!) per week. Moderation of alcohol intake to this level decreases your risk of breast cancer and liver damage.  ( And  of course, no recreational drugs are part of a healthy lifestyle.)  Also, you should not be smoking at all or even being exposed to second hand smoke. Most people know smoking can cause cancer, and various heart and lung diseases, but did you know it also contributes to weakening of your bones?  Aging of your skin?  Yellowing of your teeth and nails?   CALCIUM AND VITAMIN D:  Adequate intake of calcium and Vitamin D are recommended.  The recommendations for exact amounts of these supplements seem to change often, but generally speaking 600 mg of calcium (either carbonate or citrate) and 800 units of Vitamin D per day seems prudent. Certain women may benefit from higher intake of Vitamin D.  If you are among these women, your doctor will have told you during your visit.     PAP SMEARS:  Pap smears, to check for cervical cancer or precancers,  have traditionally been done yearly, although recent scientific advances have shown that most women can have pap smears less often.  However, every woman still should have a physical exam from her gynecologist or primary care physician every year. It will include a breast check, inspection of the vulva and vagina to check for abnormal growths or skin changes, a visual exam of the cervix, and then an exam to evaluate the size and shape of the uterus and ovaries.  And after 25 years of age, a rectal exam is indicated to check for rectal cancers. We will also provide age appropriate advice regarding health maintenance, like when you should have certain vaccines, screening for sexually transmitted diseases, bone density  testing, colonoscopy, mammograms, etc.    MAMMOGRAMS:  All women over 49 years old should have a yearly mammogram. Many facilities now offer a "3D" mammogram, which may cost around $50 extra out of pocket. If possible,  we recommend you accept the option to have the 3D mammogram performed.  It both reduces the number of women who will be called back for extra views which then turn out to be normal, and it is better than the routine mammogram at detecting truly abnormal areas.     COLONOSCOPY:  Colonoscopy to screen for colon cancer is recommended for all women at age 57.  We know, you hate the idea of the prep.  We agree, BUT, having colon cancer and not knowing it is worse!!  Colon cancer so often starts as a polyp that can be seen and removed at colonscopy, which can quite literally save your life!  And if your first colonoscopy is normal and you have no family history of colon cancer, most women don't have to have it again for 10 years.  Once every ten years, you can do something that may end up saving your life, right?  We will be happy to help you get it scheduled when you are ready.  Be sure to check your insurance coverage so you understand how much it will cost.  It may be covered as a preventative service at no cost, but you should check your particular policy.

## 2016-11-10 NOTE — Progress Notes (Signed)
Impression and Recommendations:    1. Encounter for wellness examination   2. Screening for multiple conditions   3. Counseling on health promotion and disease prevention   4. Papanicolaou smear for cervical cancer screening     Please see orders section below for further details of actions taken during this office visit.  Gross side effects, risk and benefits, and alternatives of medications discussed with patient.  Patient is aware that all medications have potential side effects and we are unable to predict every side effect or drug-drug interaction that may occur.  Expresses verbal understanding and consents to current therapy plan and treatment regiment.  1) Anticipatory Guidance: Discussed importance of wearing a seatbelt while driving, not texting while driving; sunscreen when outside along with yearly skin surveillance; eating a well balanced and modest diet; physical activity at least 25 minutes per day or 150 min/ week of moderate to intense activity.  2) Immunizations / Screenings / Labs:  All immunizations and screenings that patient agrees to, are up-to-date per recommendations or will be updated today.  Patient understands the needs for q 63mo dental and yearly vision screens which pt will schedule independently. Obtain CBC, CMP, HgA1c, Lipid panel, TSH and vit D when fasting if not already done recently.   3) Weight:   Discussed goal of losing even 5-10% of current body weight which would improve overall feelings of well being and improve objective health data significantly.   Improve nutrient density of diet through increasing intake of fruits and vegetables and decreasing saturated/trans fats, white flour products and refined sugar products.   F-up preventative CPE in 1 year. F/up sooner for chronic care management as discussed and/or prn.   No problem-specific Assessment & Plan notes found for this encounter.    No orders of the defined types were placed in this  encounter.    No orders of the defined types were placed in this encounter.    Discontinued Medications   No medications on file      Please see orders placed and AVS handed out to patient at the end of our visit for further patient instructions/ counseling done pertaining to today's office visit.     Subjective:   No chief complaint on file.  CC:   HPI: Stefanie White is a 26 y.o. female who presents to [facility] today a yearly health maintenance exam.  Health Maintenance Summary Reviewed and updated, unless pt declines services.  Aspirin: administering 81 mg daily Colonoscopy:     (Unnecessary secondary to < 10 or > 99 years old.) Tdap: Up to date: needs TD  Pneumovax/PPSV23:  Up to date: see Immunizations. Prevnar 13/PCV13:    UTD: needs  Zostavax:    Postponed. Tobacco History Reviewed:   Y  CT scan for screening lung CA:   *** Abdominal Ultrasound:     ( Unnecessary secondary to < 31 or > 68 years old) Alcohol:    No concerns, no excessive use Exercise Habits:   Not meeting AHA guidelines STD concerns:   none Drug Use:   None Birth control method:   n/a Menses regular:     n/a Lumps or breast concerns:      no Breast Cancer Family History:      No Bone/ DEXA scan:    *** ( Unnecessary due to < 65 and average risk)  [email or web address]   Wt Readings from Last 3 Encounters:  08/29/16 170 lb (77.1  kg)  05/22/16 170 lb 8 oz (77.3 kg)  03/03/16 166 lb 1.6 oz (75.3 kg)   BP Readings from Last 3 Encounters:  08/29/16 103/67  05/22/16 (!) 89/63  03/03/16 117/87   Pulse Readings from Last 3 Encounters:  08/29/16 69  05/22/16 83  03/03/16 (!) 109     Past Medical History:  Diagnosis Date  . Alopecia       No past surgical history on file.    Family History  Problem Relation Age of Onset  . Diabetes Father 6  . Hypertension Father 101  . Depression Brother 10       bipolar  . Cancer Maternal Aunt  45       colon  . Heart attack Paternal Grandmother 64      History  Drug Use No  ,   History  Alcohol Use  . 0.6 oz/week  . 1 Glasses of wine per week  ,   History  Smoking Status  . Former Smoker  . Packs/day: 0.25  . Years: 1.00  . Types: Cigarettes  . Quit date: 06/12/2010  Smokeless Tobacco  . Never Used  ,   History  Sexual Activity  . Sexual activity: Yes  . Birth control/ protection: Implant    Current Outpatient Prescriptions on File Prior to Visit  Medication Sig Dispense Refill  . aspirin-acetaminophen-caffeine (EXCEDRIN MIGRAINE) 250-250-65 MG tablet Take 1-2 tablets by mouth every 6 (six) hours as needed for headache.    . cephALEXin (KEFLEX) 500 MG capsule Take 1 capsule (500 mg total) by mouth 3 (three) times daily. 15 capsule 0  . etonogestrel (NEXPLANON) 68 MG IMPL implant 1 each by Subdermal route once.    . fexofenadine-pseudoephedrine (ALLEGRA-D ALLERGY & CONGESTION) 180-240 MG 24 hr tablet Take 1 tablet by mouth daily. 90 tablet 1  . fluticasone (FLONASE) 50 MCG/ACT nasal spray Place 1 spray into both nostrils 2 (two) times daily. 16 g 6  . Multiple Vitamin (MULTIVITAMIN) capsule Take 1 capsule by mouth daily.    . ondansetron (ZOFRAN ODT) 4 MG disintegrating tablet Take 1 tablet (4 mg total) by mouth every 8 (eight) hours as needed for nausea or vomiting. 20 tablet 0   No current facility-administered medications on file prior to visit.     Allergies: Rizatriptan  Review of Systems: General:   Denies fever, chills, unexplained weight loss.  Optho/Auditory:   Denies visual changes, blurred vision/LOV Respiratory:   Denies SOB, DOE more than baseline levels.  Cardiovascular:   Denies chest pain, palpitations, new onset peripheral edema  Gastrointestinal:   Denies nausea, vomiting, diarrhea.  Genitourinary: Denies dysuria, freq/ urgency, flank pain or discharge from genitals.  Endocrine:     Denies hot or cold intolerance, polyuria,  polydipsia. Musculoskeletal:   Denies unexplained myalgias, joint swelling, unexplained arthralgias, gait problems.  Skin:  Denies rash, suspicious lesions Neurological:     Denies dizziness, unexplained weakness, numbness  Psychiatric/Behavioral:   Denies mood changes, suicidal or homicidal ideations, hallucinations    Objective:    There were no vitals taken for this visit. There is no height or weight on file to calculate BMI. General Appearance:    Alert, cooperative, no distress, appears stated age  Head:    Normocephalic, without obvious abnormality, atraumatic  Eyes:    PERRL, conjunctiva/corneas clear, EOM's intact, fundi    benign, both eyes  Ears:    Normal TM's and external ear canals, both ears  Nose:   Nares normal,  septum midline, mucosa normal, no drainage    or sinus tenderness  Throat:   Lips w/o lesion, mucosa moist, and tongue normal; teeth and   gums normal  Neck:   Supple, symmetrical, trachea midline, no adenopathy;    thyroid:  no enlargement/tenderness/nodules; no carotid   bruit or JVD  Back:     Symmetric, no curvature, ROM normal, no CVA tenderness  Lungs:     Clear to auscultation bilaterally, respirations unlabored, no       Wh/ R/ R  Chest Wall:    No tenderness or gross deformity; normal excursion   Heart:    Regular rate and rhythm, S1 and S2 normal, no murmur, rub   or gallop  Breast Exam:    No tenderness, masses, or nipple abnormality b/l; no d/c  Abdomen:     Soft, non-tender, bowel sounds active all four quadrants, NO   G/R/R, no masses, no organomegaly  Genitalia:    Ext genitalia: without lesion, no rash or discharge, No         tenderness;  Cervix: WNL's w/o discharge or lesion;        Adnexa:  No tenderness or palpable masses   Rectal:    Normal tone, no masses or tenderness;   guaiac negative stool  Extremities:   Extremities normal, atraumatic, no cyanosis or gross edema  Pulses:   2+ and symmetric all extremities  Skin:   Warm, dry, Skin  color, texture, turgor normal, no obvious rashes or lesions Psych: No HI/SI, judgement and insight good, Euthymic mood. Full Affect.  Neurologic:   CNII-XII intact, normal strength, sensation and reflexes    Throughout   DEXA:    [email or web address]   Based on the [place] FRAX tool, a 25 year old white woman with no other risk factors has a 9.3% 10-year risk for any osteoporotic fracture. White women between the ages of 4650 and 4264 years with equivalent or greater 10-year fracture risks based on specific risk factors include but are not limited to the following persons: 351) a 25 year old current smoker with a BMI less than 21 kg/m2, daily alcohol use, and parental fracture history; 482) a 25 year old woman with a parental fracture history; 353) a 25 year old woman with a BMI less than 21 kg/m2 and daily alcohol use; and 314) a 25 year old current smoker with daily alcohol use.   The FRAX tool also predicts 10-year fracture risks for black, Asian, and Hispanic women in the [place]. In general, estimated fracture risks in nonwhite women are lower than those for white women of the same age. Although the USPSTF recommends using a 9.3% 10-year fracture risk threshold to screen women aged 25 to 1164 years,

## 2016-12-14 LAB — HIV ANTIBODY (ROUTINE TESTING W REFLEX): HIV 1&2 Ab, 4th Generation: NONREACTIVE

## 2017-03-08 ENCOUNTER — Ambulatory Visit (INDEPENDENT_AMBULATORY_CARE_PROVIDER_SITE_OTHER): Payer: 59 | Admitting: Family Medicine

## 2017-03-08 ENCOUNTER — Encounter: Payer: Self-pay | Admitting: Family Medicine

## 2017-03-08 VITALS — BP 105/72 | HR 84 | Temp 97.7°F | Ht 63.0 in | Wt 181.1 lb

## 2017-03-08 DIAGNOSIS — L709 Acne, unspecified: Secondary | ICD-10-CM

## 2017-03-08 DIAGNOSIS — L02429 Furuncle of limb, unspecified: Secondary | ICD-10-CM

## 2017-03-08 DIAGNOSIS — L02439 Carbuncle of limb, unspecified: Secondary | ICD-10-CM

## 2017-03-08 NOTE — Progress Notes (Signed)
Pt here for an acute care OV today   Impression and Recommendations:    1. Carbuncle and furuncle of leg   2. Acne, unspecified acne type    Use Eucerin facial wash morning and night.  Then you can use a facial toner from uterine or Neutrogena to help with that we will balance of your face.  Please do not ever pick any pustules or pimples that you have this will make them worse and even cause scarring on her face.  There is acne creams and ointments\medicines are available over-the-counter such as bird's bees blemish stick, clearsil creams etc.   Carbuncle and furuncle of leg  Acne, unspecified acne type  No problem-specific Assessment & Plan notes found for this encounter.   The patient was counseled, risk factors were discussed, anticipatory guidance given.  New Prescriptions   No medications on file    Discontinued Medications   No medications on file    Modified Medications   No medications on file     Meds ordered this encounter  Medications  . Cholecalciferol (VITAMIN D) 2000 units CAPS    Sig: Take 1 capsule by mouth daily.    No orders of the defined types were placed in this encounter.    Gross side effects, risk and benefits, and alternatives of medications and treatment plan in general discussed with patient.  Patient is aware that all medications have potential side effects and we are unable to predict every side effect or drug-drug interaction that may occur.   Patient will call with any questions prior to using medication if they have concerns.  Expresses verbal understanding and consents to current therapy and treatment regimen.  No barriers to understanding were identified.  Red flag symptoms and signs discussed in detail.  Patient expressed understanding regarding what to do in case of emergency\urgent symptoms  Please see AVS handed out to patient at the end of our visit for further patient instructions/ counseling done pertaining to today's office  visit.   Return if symptoms worsen or fail to improve, for And follow-up for chronic care as discussed prior.     Note: This document was prepared using Dragon voice recognition software and may include unintentional dictation errors.  Thomasene Lot 9:39 AM --------------------------------------------------------------------------------------------------------------------------------------------------------------------------------------------------------------------------------------------    Subjective:    CC:  Chief Complaint  Patient presents with  . Cyst    patient has a bump on inner lt thigh x 1 month, increasing in size x 3 days     HPI: Stefanie White is a 26 y.o. female who presents to [facility] today for issues as discussed below.  L  Upper inner thigh with pustule formed after shaving.  Patient had picked it and cause some tissue trauma now hard nodule there patient's were wondering what it is.  Also worried about acne on her face.  She's been picking her face a lot as well.  She is using the [employer] facial cleanser- but not daily     Problem  Carbuncle and Furuncle of Leg     Wt Readings from Last 3 Encounters:  03/08/17 181 lb 1.6 oz (82.1 kg)  11/10/16 170 lb (77.1 kg)  08/29/16 170 lb (77.1 kg)   BP Readings from Last 3 Encounters:  03/08/17 105/72  11/10/16 119/72  08/29/16 103/67   BMI Readings from Last 3 Encounters:  03/08/17 32.08 kg/m  11/10/16 30.11 kg/m  08/29/16 30.11 kg/m  Patient Care Team    Relationship Specialty Notifications Start End  Thomasene Lot, DO PCP - General Family Medicine  03/01/16   Waynard Reeds, MD Consulting Physician Obstetrics and Gynecology  03/01/16   Glenford Peers, OD Referring Physician Optometry  03/01/16      Patient Active Problem List   Diagnosis Date Noted  . Carbuncle and furuncle of leg 03/08/2017  . Vitamin D insufficiency 05/22/2016  . Obesity, Class I,  BMI 30-34.9 05/22/2016  . Low serum HDL 05/22/2016  . Acute left otitis media 03/03/2016  . Otitis, externa, infective 03/03/2016  . Pharyngitis 03/03/2016  . Fever, unspecified 03/03/2016  . Migraine headache 03/01/2016  . History of ADHD 03/01/2016  . Environmental and seasonal allergies 03/01/2016  . Viral URI 03/01/2016  . h/o Alopecia areata 05/22/2014    Past Medical history, Surgical history, Family history, Social history, Allergies and Medications have been entered into the medical record, reviewed and changed as needed.    Current Meds  Medication Sig  . aspirin-acetaminophen-caffeine (EXCEDRIN MIGRAINE) 250-250-65 MG tablet Take 1-2 tablets by mouth every 6 (six) hours as needed for headache.  . Cholecalciferol (VITAMIN D) 2000 units CAPS Take 1 capsule by mouth daily.  Marland Kitchen etonogestrel (NEXPLANON) 68 MG IMPL implant 1 each by Subdermal route once.  . fexofenadine-pseudoephedrine (ALLEGRA-D ALLERGY & CONGESTION) 180-240 MG 24 hr tablet Take 1 tablet by mouth daily.  . fluticasone (FLONASE) 50 MCG/ACT nasal spray Place 1 spray into both nostrils 2 (two) times daily.  . Multiple Vitamin (MULTIVITAMIN) capsule Take 1 capsule by mouth daily.  . ondansetron (ZOFRAN ODT) 4 MG disintegrating tablet Take 1 tablet (4 mg total) by mouth every 8 (eight) hours as needed for nausea or vomiting.    Allergies:  Allergies  Allergen Reactions  . Rizatriptan Swelling    Mouth swelling     Review of Systems: General:   Denies fever, chills, unexplained weight loss.  Optho/Auditory:   Denies visual changes, blurred vision/LOV Respiratory:   Denies wheeze, DOE more than baseline levels.  Cardiovascular:   Denies chest pain, palpitations, new onset peripheral edema  Gastrointestinal:   Denies nausea, vomiting, diarrhea, abd pain.  Genitourinary: Denies dysuria, freq/ urgency, flank pain or discharge from genitals.  Endocrine:     Denies hot or cold intolerance, polyuria,  polydipsia. Musculoskeletal:   Denies unexplained myalgias, joint swelling, unexplained arthralgias, gait problems.  Skin:  Denies new onset rash, suspicious lesions Neurological:     Denies dizziness, unexplained weakness, numbness  Psychiatric/Behavioral:   Denies mood changes, suicidal or homicidal ideations, hallucinations    Objective:   Blood pressure 105/72, pulse 84, temperature 97.7 F (36.5 C), height  (1.6 m), weight 181 lb 1.6 oz (82.1 kg), SpO2 99 %. Body mass index is 32.08 kg/m. General:  Well Developed, well nourished, appropriate for stated age.  Neuro:  Alert and oriented,  extra-ocular muscles intact  HEENT:  Normocephalic, atraumatic, neck supple Skin:  no gross rash, warm, pink. Hard and subcutaneous papule left upper inner thigh.,  Acne scarring and discoloration to write lower chin.  No active pustules but has nodular acne Cardiac:  RRR, S1 S2 Respiratory:  ECTA B/L and A/P, Not using accessory muscles, speaking in full sentences- unlabored. Vascular:  Ext warm, no cyanosis apprec.; cap RF less 2 sec. Psych:  No HI/SI, judgement and insight good, Euthymic mood. Full Affect.

## 2017-03-08 NOTE — Patient Instructions (Signed)
Use Eucerin facial wash morning and night.  Then you can use a facial toner from uterine or Neutrogena to help with that we will balance of your face.  Please do not ever pick any pustules or pimples that you have this will make them worse and even cause scarring on her face.  There is acne creams and ointments\medicines are available over-the-counter such as bird's bees blemish stick, clearsil creams etc.   Acne Acne is a skin problem that causes small, red bumps (pimples). Acne happens when the tiny holes in your skin (pores) get blocked. Your pores may become red, sore, and swollen. They may also become infected. Acne is a common skin problem. It is especially common in teenagers. Acne usually goes away over time. Follow these instructions at home: Good skin care is the most important thing you can do to treat your acne. Take care of your skin as told by your doctor. You may be told to do these things:  Wash your skin gently at least two times each day. You should also wash your skin: ? After you exercise. ? Before you go to bed.  Use mild soap.  Use a water-based skin moisturizer after you wash your skin.  Use a sunscreen or sunblock with SPF 30 or greater. This is very important if you are using acne medicines.  Choose cosmetics that will not plug your oil glands (are noncomedogenic).  Medicines  Take over-the-counter and prescription medicines only as told by your doctor.  If you were prescribed an antibiotic medicine, apply or take it as told by your doctor. Do not stop using the antibiotic even if your acne improves. General instructions  Keep your hair clean and off of your face. Shampoo your hair regularly. If you have oily hair, you may need to wash it every day.  Avoid leaning your chin or forehead on your hands.  Avoid wearing tight headbands or hats.  Avoid picking or squeezing your pimples. That can make your acne worse and cause scarring.  Keep all follow-up visits  as told by your doctor. This is important.  Shave gently. Only shave when it is necessary.  Keep a food journal. This can help you to see if any foods are linked with your acne. Contact a doctor if:  Your acne is not better after eight weeks.  Your acne gets worse.  You have a large area of skin that is red or tender.  You think that you are having side effects from any acne medicine. This information is not intended to replace advice given to you by your health care provider. Make sure you discuss any questions you have with your health care provider. Document Released: 05/18/2011 Document Revised: 11/04/2015 Document Reviewed: 08/05/2014 Elsevier Interactive Patient Education  2018 [employer].      Acne Acne is a skin problem that causes pimples. Acne occurs when the pores in the skin get blocked. The pores may become infected with bacteria, or they may become red, sore, and swollen. Acne is a common skin problem, especially for teenagers. Acne usually goes away over time. What are the causes? Each pore contains an oil gland. Oil glands make an oily substance that is called sebum. Acne happens when these glands get plugged with sebum, dead skin cells, and dirt. Then, the bacteria that are normally found in the oil glands multiply and cause inflammation. Acne is commonly triggered by changes in your hormones. These hormonal changes can cause the oil glands to  get bigger and to make more sebum. Factors that can make acne worse include:  Hormone changes during: ? Adolescence. ? Women's menstrual cycles. ? Pregnancy.  Oil-based cosmetics and hair products.  Harshly scrubbing the skin.  Strong soaps.  Stress.  Hormone problems that are due to certain diseases.  Long or oily hair rubbing against the skin.  Certain medicines.  Pressure from headbands, backpacks, or shoulder pads.  Exposure to certain oils and chemicals.  What increases the risk? This condition is  more likely to develop in:  Teenagers.  People who have a family history of acne.  What are the signs or symptoms? Acne often occurs on the face, neck, chest, and upper back. Symptoms include:  Small, red bumps (pimples or papules).  Whiteheads.  Blackheads.  Small, pus-filled pimples (pustules).  Big, red pimples or pustules that feel tender.  More severe acne can cause:  An infected area that contains a collection of pus (abscess).  Hard, painful, fluid-filled sacs (cysts).  Scars.  How is this diagnosed? This condition is diagnosed with a medical history and physical exam. Blood tests may also be done. How is this treated? Treatment for this condition can vary depending on the severity of your acne. Treatment may include:  Creams and lotions that prevent oil glands from clogging.  Creams and lotions that treat or prevent infections and inflammation.  Antibiotic medicines that are applied to the skin or taken as a pill.  Pills that decrease sebum production.  Birth control pills.  Light or laser treatments.  Surgery.  Injections of medicine into the affected areas.  Chemicals that cause peeling of the skin.  Your health care provider will also recommend the best way to take care of your skin. Good skin care is the most important part of treatment. Follow these instructions at home: Skin care Take care of your skin as told by your health care provider. You may be told to do these things:  Wash your skin gently at least two times each day, as well as: ? After you exercise. ? Before you go to bed.  Use mild soap.  Apply a water-based skin moisturizer after you wash your skin.  Use a sunscreen or sunblock with SPF 30 or greater. This is especially important if you are using acne medicines.  Choose cosmetics that will not plug your oil glands (are noncomedogenic).  Medicines  Take over-the-counter and prescription medicines only as told by your health  care provider.  If you were prescribed an antibiotic medicine, apply or take it as told by your health care provider. Do not stop taking the antibiotic even if your condition improves. General instructions  Keep your hair clean and off of your face. If you have oily hair, shampoo your hair regularly or daily.  Avoid leaning your chin or forehead against your hands.  Avoid wearing tight headbands or hats.  Avoid picking or squeezing your pimples. That can make your acne worse and cause scarring.  Keep all follow-up visits as told by your health care provider. This is important.  Shave gently and only when necessary.  Keep a food journal to figure out if any foods are linked with your acne. Contact a health care provider if:  Your acne is not better after eight weeks.  Your acne gets worse.  You have a large area of skin that is red or tender.  You think that you are having side effects from any acne medicine. This information is not  intended to replace advice given to you by your health care provider. Make sure you discuss any questions you have with your health care provider. Document Released: 05/26/2000 Document Revised: 01/28/2016 Document Reviewed: 08/05/2014 Elsevier Interactive Patient Education  [employer].

## 2017-05-14 ENCOUNTER — Other Ambulatory Visit: Payer: 59

## 2017-05-14 DIAGNOSIS — Z0000 Encounter for general adult medical examination without abnormal findings: Secondary | ICD-10-CM | POA: Diagnosis not present

## 2017-05-14 DIAGNOSIS — E559 Vitamin D deficiency, unspecified: Secondary | ICD-10-CM

## 2017-05-14 DIAGNOSIS — R748 Abnormal levels of other serum enzymes: Secondary | ICD-10-CM

## 2017-05-14 DIAGNOSIS — E66811 Obesity, class 1: Secondary | ICD-10-CM

## 2017-05-14 DIAGNOSIS — E669 Obesity, unspecified: Secondary | ICD-10-CM | POA: Diagnosis not present

## 2017-05-15 LAB — TSH: TSH: 3.34 u[IU]/mL (ref 0.450–4.500)

## 2017-05-15 LAB — COMPREHENSIVE METABOLIC PANEL
ALBUMIN: 4.4 g/dL (ref 3.5–5.5)
ALT: 27 IU/L (ref 0–32)
AST: 24 IU/L (ref 0–40)
Albumin/Globulin Ratio: 1.5 (ref 1.2–2.2)
Alkaline Phosphatase: 85 IU/L (ref 39–117)
BUN / CREAT RATIO: 12 (ref 9–23)
BUN: 7 mg/dL (ref 6–20)
Bilirubin Total: <0.2 mg/dL (ref 0.0–1.2)
CALCIUM: 8.9 mg/dL (ref 8.7–10.2)
CO2: 18 mmol/L — AB (ref 20–29)
Chloride: 106 mmol/L (ref 96–106)
Creatinine, Ser: 0.57 mg/dL (ref 0.57–1.00)
GFR, EST AFRICAN AMERICAN: 149 mL/min/{1.73_m2} (ref >59)
GFR, EST NON AFRICAN AMERICAN: 129 mL/min/{1.73_m2} (ref >59)
Globulin, Total: 2.9 g/dL (ref 1.5–4.5)
Glucose: 94 mg/dL (ref 65–99)
POTASSIUM: 4.2 mmol/L (ref 3.5–5.2)
Sodium: 140 mmol/L (ref 134–144)
TOTAL PROTEIN: 7.3 g/dL (ref 6.0–8.5)

## 2017-05-15 LAB — CBC WITH DIFFERENTIAL/PLATELET
Basophils Absolute: 0.1 x10E3/uL (ref 0.0–0.2)
Basos: 1 %
EOS (ABSOLUTE): 0.4 x10E3/uL (ref 0.0–0.4)
Eos: 4 %
Hematocrit: 41.6 % (ref 34.0–46.6)
Hemoglobin: 13.8 g/dL (ref 11.1–15.9)
Immature Grans (Abs): 0 x10E3/uL (ref 0.0–0.1)
Immature Granulocytes: 0 %
Lymphocytes Absolute: 2.5 x10E3/uL (ref 0.7–3.1)
Lymphs: 26 %
MCH: 29.8 pg (ref 26.6–33.0)
MCHC: 33.2 g/dL (ref 31.5–35.7)
MCV: 90 fL (ref 79–97)
Monocytes Absolute: 0.8 x10E3/uL (ref 0.1–0.9)
Monocytes: 8 %
Neutrophils Absolute: 6 x10E3/uL (ref 1.4–7.0)
Neutrophils: 61 %
Platelets: 331 x10E3/uL (ref 150–379)
RBC: 4.63 x10E6/uL (ref 3.77–5.28)
RDW: 12.8 % (ref 12.3–15.4)
WBC: 9.7 x10E3/uL (ref 3.4–10.8)

## 2017-05-15 LAB — HEMOGLOBIN A1C
Est. average glucose Bld gHb Est-mCnc: 103 mg/dL
Hgb A1c MFr Bld: 5.2 % (ref 4.8–5.6)

## 2017-05-15 LAB — LIPID PANEL
Chol/HDL Ratio: 4.3 ratio (ref 0.0–4.4)
Cholesterol, Total: 138 mg/dL (ref 100–199)
HDL: 32 mg/dL — ABNORMAL LOW
LDL Calculated: 65 mg/dL (ref 0–99)
Triglycerides: 203 mg/dL — ABNORMAL HIGH (ref 0–149)
VLDL Cholesterol Cal: 41 mg/dL — ABNORMAL HIGH (ref 5–40)

## 2017-05-15 LAB — VITAMIN D 25 HYDROXY (VIT D DEFICIENCY, FRACTURES): Vit D, 25-Hydroxy: 27.3 ng/mL — ABNORMAL LOW (ref 30.0–100.0)

## 2017-05-21 ENCOUNTER — Ambulatory Visit: Payer: 59 | Admitting: Family Medicine

## 2017-07-11 DIAGNOSIS — Z3046 Encounter for surveillance of implantable subdermal contraceptive: Secondary | ICD-10-CM | POA: Diagnosis not present

## 2017-07-11 MED FILL — LARIN FE 1-20 TABLET: 1-20 | 84 days supply | Qty: 84 | Fill #0

## 2017-08-29 ENCOUNTER — Encounter: Payer: Self-pay | Admitting: Family Medicine

## 2017-08-29 ENCOUNTER — Ambulatory Visit (INDEPENDENT_AMBULATORY_CARE_PROVIDER_SITE_OTHER): Payer: 59 | Admitting: Family Medicine

## 2017-08-29 VITALS — BP 137/90 | HR 90 | Ht 63.0 in | Wt 188.5 lb

## 2017-08-29 DIAGNOSIS — R41840 Attention and concentration deficit: Secondary | ICD-10-CM | POA: Insufficient documentation

## 2017-08-29 MED ORDER — AMPHETAMINE-DEXTROAMPHET ER 10 MG PO CP24: 10.0000 mg | ORAL_CAPSULE | Freq: Two times a day (BID) | ORAL | 0 refills | Status: DC

## 2017-08-29 NOTE — Progress Notes (Signed)
Impression and Recommendations:    1. Attention and concentration deficit     1. Attention and concentration deficit -Start adderall. Told pt she may need to take one in the morning and one after lunch time in order to have the full effect of the medication.   -Discussed starting meds vs getting evaluated by specialists. Pt prefers to start medications at this point.    -Follow up in 1 month for evaluation after starting adderall.  If you develop side effects to the medications like inability to sleep or other side effects, call the office and let us know.    No orders of the defined types were placed in this encounter.   Meds ordered this encounter  Medications  . amphetamine-dextroamphetamine (ADDERALL XR) 10 MG 24 hr capsule    Sig: Take 1 capsule (10 mg total) by mouth 2 (two) times daily.    Dispense:  180 capsule    Refill:  0    Gross side effects, risk and benefits, and alternatives of medications and treatment plan in general discussed with patient.  Patient is aware that all medications have potential side effects and we are unable to predict every side effect or drug-drug interaction that may occur.   Patient will call with any questions prior to using medication if they have concerns.  Expresses verbal understanding and consents to current therapy and treatment regimen.  No barriers to understanding were identified.  Red flag symptoms and signs discussed in detail.  Patient expressed understanding regarding what to do in case of emergency\urgent symptoms  Please see AVS handed out to patient at the end of our visit for further patient instructions/ counseling done pertaining to today's office visit.   Return in about 4 weeks (around 09/26/2017).    Note: This note was prepared with assistance of Dragon voice recognition software. Occasional wrong-word or sound-a-like substitutions may have occurred due to the inherent limitations of voice recognition  software.  This document serves as a record of services personally performed by Thomasene Loteborah Sheri Gatchel, DO. It was created on her behalf by Thelma BargeNick Cochran, a trained medical scribe. The creation of this record is based on the scribe's personal observations and the provider's statements to them.   I have reviewed the above medical documentation for accuracy and completeness and I concur.  Thomasene LotDeborah Nykolas Bacallao 09/04/17 8:24 AM   --------------------------------------------------------------------------------------------------------------------------------------------------------------------------------------------------------------------------------------------    Subjective:     HPI: Stefanie White is a 27 y.o. female who [facility] for [facility] problems.  Per pt, she states she started a new job that requires her to be focused. 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 She states she has difficulty focusing and staying focused on her work. She thought she could learn this more on the job and improve her focus, but this has not improved since then. She states her job has been working well with her.   She has a h/o ADHD as a child that was well-controlled and was previously on meds. She did not like side effects of her medicines when she was on it. She works 10 hour shifts and would prefer to have full coverage for when she is at work.    Wt Readings from Last 3 Encounters:  08/29/17 188 lb 8 oz (85.5 kg)  03/08/17 181 lb 1.6 oz (82.1 kg)  11/10/16 170 lb (77.1 kg)   BP Readings  from Last 3 Encounters:  08/29/17 137/90  03/08/17 105/72  11/10/16 119/72   Pulse Readings from Last 3 Encounters:  08/29/17 90  03/08/17 84  11/10/16 69   BMI Readings from Last 3 Encounters:  08/29/17 33.39 kg/m  03/08/17 32.08 kg/m  11/10/16 30.11 kg/m     Patient Care Team    Relationship Specialty Notifications  Start End  Thomasene Lot, DO PCP - General Family Medicine  03/01/16   Waynard Reeds, MD Consulting Physician Obstetrics and Gynecology  03/01/16   Glenford Peers, OD Referring Physician Optometry  03/01/16      Patient Active Problem List   Diagnosis Date Noted  . Attention and concentration deficit 08/29/2017  . Carbuncle and furuncle of leg 03/08/2017  . Vitamin D insufficiency 05/22/2016  . Obesity, Class I, BMI 30-34.9 05/22/2016  . Low serum HDL 05/22/2016  . Acute left otitis media 03/03/2016  . Otitis, externa, infective 03/03/2016  . Pharyngitis 03/03/2016  . Fever, unspecified 03/03/2016  . Migraine headache 03/01/2016  . History of ADHD 03/01/2016  . Environmental and seasonal allergies 03/01/2016  . Viral URI 03/01/2016  . h/o Alopecia areata 05/22/2014    Past Medical history, Surgical history, Family history, Social history, Allergies and Medications have been entered into the medical record, reviewed and changed as needed.    Current Meds  Medication Sig  . aspirin-acetaminophen-caffeine (EXCEDRIN MIGRAINE) 250-250-65 MG tablet Take 1-2 tablets by mouth every 6 (six) hours as needed for headache.  . Cholecalciferol (VITAMIN D) 2000 units CAPS Take 1 capsule by mouth daily.  . fexofenadine-pseudoephedrine (ALLEGRA-D ALLERGY & CONGESTION) 180-240 MG 24 hr tablet Take 1 tablet by mouth daily.  . fluticasone (FLONASE) 50 MCG/ACT nasal spray Place 1 spray into both nostrils 2 (two) times daily.  . Multiple Vitamin (MULTIVITAMIN) capsule Take 1 capsule by mouth daily.    Allergies:  Allergies  Allergen Reactions  . Rizatriptan Swelling    Mouth swelling     Review of Systems:  A fourteen system review of systems was performed and found to be positive as per HPI.   Objective:   Blood pressure 137/90, pulse 90, height 5\' 3"  (1.6 m), weight 188 lb 8 oz (85.5 kg), last menstrual period 08/08/2017, SpO2 100 %. Body mass index is 33.39 kg/m. General:  Well  Developed, well nourished, appropriate for stated age.  Neuro:  Alert and oriented,  extra-ocular muscles intact  HEENT:  Normocephalic, atraumatic, neck supple, no carotid bruits appreciated  Skin:  no gross rash, warm, pink. Cardiac:  RRR, S1 S2 Respiratory:  ECTA B/L and A/P, Not using accessory muscles, speaking in full sentences- unlabored. Vascular:  Ext warm, no cyanosis apprec.; cap RF less 2 sec. Psych:  No HI/SI, judgement and insight good, Euthymic mood. Full Affect.

## 2017-08-29 NOTE — Patient Instructions (Addendum)
Please try to eat healthy and low carbohydrate low sugar diet.  Is also important you try to get 30-40 minutes of exercise daily as these 2 things together can be extremely important in helping control the symptoms of attention deficit disorder.   For more information you can go to:  [email or web address]

## 2017-09-03 ENCOUNTER — Telehealth: Payer: Self-pay | Admitting: Family Medicine

## 2017-09-03 NOTE — Telephone Encounter (Signed)
Patient called asking about PA update on adderall med that was [facility]

## 2017-09-04 NOTE — Telephone Encounter (Signed)
Called insurance still waiting on clinical review for the PA should have approval or denial today or tomorrow. Patient notified. MPulliam, CMA/RT(R)

## 2017-09-05 ENCOUNTER — Telehealth: Payer: Self-pay | Admitting: Family Medicine

## 2017-09-05 NOTE — Telephone Encounter (Signed)
Patient wants to speak to someone about the adderall PA and where it is in review, in addition, the pharmacy rejected the original Rx that she dropped off so she isn't sure if she needs to come back and pick up another physical script

## 2017-09-05 NOTE — Telephone Encounter (Signed)
Called patient notified her that PA was denied and that I will have to check with Dr. Sharee Holsterpalski about changes to the medication or dose to get it approved and that I will call her back. MPulliam, CMA/RT(R)

## 2017-09-06 ENCOUNTER — Other Ambulatory Visit: Payer: Self-pay

## 2017-09-06 MED ORDER — AMPHETAMINE-DEXTROAMPHET ER 10 MG PO CP24
20.0000 mg | ORAL_CAPSULE | Freq: Every day | ORAL | 0 refills | Status: DC
Start: 2017-09-06 — End: 2017-09-11

## 2017-09-06 NOTE — Progress Notes (Signed)
[facility] for Adderall XR 10 mg take 1 tablet twice a day was denied by insurance.  Per insurance medication is covered for once a day dosage.  I informed Dr. Sharee Holsterpalski and show her the paperwork we received from the insurance company.  She instructed to change RX to Adderall XR 10 mg take 2 tablets daily in the morning.  New RX printed and signed by Dr. Sharee Holsterpalski and patient notified to pick up.  MPulliam, CMA/RT(R)

## 2017-09-11 ENCOUNTER — Other Ambulatory Visit: Payer: Self-pay | Admitting: Adult Health

## 2017-09-11 ENCOUNTER — Telehealth: Payer: Self-pay

## 2017-09-11 MED ORDER — AMPHETAMINE-DEXTROAMPHET ER 10 MG PO CP24: 10.0000 mg | ORAL_CAPSULE | Freq: Every day | ORAL | 0 refills | Status: DC

## 2017-09-11 MED FILL — ADDERALL XR 10 MG CAP SA: 10 | 45 days supply | Qty: 45 | Fill #0

## 2017-09-11 NOTE — Telephone Encounter (Signed)
Called patient and notified. MPulliam, CMA/RT(R)  

## 2017-09-11 NOTE — Telephone Encounter (Signed)
Patient was seen on 08/29/2017 and started on Adderrall XR 10 mg 2 capsules daily.  PA for insurance was required and was sent in for medication.  Per Dr. Sharee Holsterpalski if denied we could change patient to 10 mg once daily #45 no refills.  PA was denied but Dr. Sharee Holsterpalski is out of the office until Monday 09/17/2016 and new RX was not written before she left.  Sent to William HamburgerKaty Danford, NP to see if she could change the RX or if it needs to wait on Dr. Sharee Holsterpalski to return to the office. MPulliam, CMA/RT(R)

## 2017-09-11 NOTE — Telephone Encounter (Signed)
Completed. Thanks! Angelgabriel Willmore  

## 2017-10-03 ENCOUNTER — Ambulatory Visit (INDEPENDENT_AMBULATORY_CARE_PROVIDER_SITE_OTHER): Payer: 59 | Admitting: Family Medicine

## 2017-10-03 ENCOUNTER — Encounter: Payer: Self-pay | Admitting: Family Medicine

## 2017-10-03 VITALS — BP 124/83 | HR 72 | Ht 63.0 in | Wt 185.0 lb

## 2017-10-03 DIAGNOSIS — R41840 Attention and concentration deficit: Secondary | ICD-10-CM

## 2017-10-03 DIAGNOSIS — L259 Unspecified contact dermatitis, unspecified cause: Secondary | ICD-10-CM | POA: Diagnosis not present

## 2017-10-03 MED ORDER — AMPHETAMINE-DEXTROAMPHET ER 10 MG PO CP24: 10.0000 mg | ORAL_CAPSULE | Freq: Every day | ORAL | 0 refills | Status: DC

## 2017-10-03 MED ORDER — NYSTATIN-TRIAMCINOLONE 100000-0.1 UNIT/GM-% EX OINT: TOPICAL_OINTMENT | CUTANEOUS | 0 refills | Status: DC

## 2017-10-03 MED FILL — NYSTATIN-TRIAMCINOLONE OINT: 100000-0.1 | 20 days supply | Qty: 60 | Fill #0

## 2017-10-03 NOTE — Progress Notes (Signed)
ADHD/ ADD OV note   Impression and Recommendations:    1. Attention and concentration deficit   2. Contact dermatitis and eczema-bilateral axilla     1. Attention and concentration deficit - Patient started adderall one month ago.  Symptoms have improved since starting.  - Since one tablet daily works, she will continue at this dose and not increase to twice daily.  - Patient will let us know if she begins needing to take the medication twice daily.  - To minimize nausea and other side-effects, advised patient to eat breakfast before taking medications - never take them on an empty stomach.  - In addition to medication, advised patient to cut down sugars and carbs and exercise regularly to help manage her attention.  - If any new side effects emerge, such as new or worsened insomnia, patient knows to call the office and let us know.  2. Acute Bilateral Axillary Rash - Contact Dermatitis - Advised patient that her rash will take a few weeks to resolve.  - Immediately discontinue use of inciting medication: new deodorant.  - Discontinue use of baby powder.  - Use steroid/antifungal cream as prescribed. - Patient may also use cornstarch to alleviate symptoms.  - Once dermatitis clears, patient may go back to using old deodorant.  3. General Health Maintenance - Advised patient to continue working toward exercising to improve health.    - Patient will begin with 15 minutes of activity daily. Recommended that the patient eventually strive for at least 150 minutes of moderate cardiovascular cativity per week according to guidelines established by the [facility].   - Healthy dietary habits encouraged, including low-carb, and high amounts of lean protein in diet.   - Patient should also consume adequate amounts of water - half of body weight in oz of water per day  Follow-Up - Return in the near future for CPE as scheduled. - Patient knows to let us know if any other concerns or  questions emerge.   Education and routine counseling performed. Handouts provided if pt desired.  No orders of the defined types were placed in this encounter.    Return for Keep your follow-up office visit that you have upcoming.   -Reminded patient the need for yearly complete physical exam office visits in addition to office visits for management of the chronic diseases  -Gross side effects, risk and benefits, and alternatives of medications discussed with patient.  Patient is aware that all medications have potential side effects and we are unable to predict every side effect or drug-drug interaction that may occur.  Expresses verbal understanding and consents to current therapy plan and treatment regimen.   Please see AVS handed out to patient at the end of our visit for further patient instructions/ counseling done pertaining to today's office visit.    Note: This document was prepared using Dragon voice recognition software and may include unintentional dictation errors.  This document serves as a record of services personally performed by Thomasene Lot, DO. It was created on her behalf by [name], a trained medical scribe. The creation of this record is based on the scribe's personal observations and the provider's statements to them.   I have reviewed the above medical documentation for accuracy and completeness and I concur.  Thomasene Lot 10/11/17 2:18 PM    ______________________________________________________________________    Subjective:  HPI: Stefanie White y.o. female  presents for 3 month follow up for multiple medical problems.  Attention and  Concentration Deficit Since starting adderall last month, "I definitely can tell that I'm focusing."  Her performance at work has improved.  Notes that now she can go through "a whole batch of checking and barely catches herself getting unfocused," unless someone actually comes up to her and distracts  her by asking for something else.  Her sleep has been fine.  Denies S-E.   Notes "I'm always a night owl anyway," so her sleep patterns haven't changed.  Notes that she does nauseous really easily now, but has not been taking her medications on a full stomach.  She does feel a flushed heat after taking the medication, but notes that "it hasn't been bad."  Does notice that her cheeks are a little warm.  So far, she only takes the medicine once daily.  She has not tried to take it twice a day yet.  To help manage her stress and attention disorder, she has been exercising and working out more often.  She and her boyfriend plan on starting a no sugar no alcohol diet for two weeks.  Notes that lately she's been dealing with some acute stress due to her brother lately.  Acute Rash Under Armpits Patient started a new all-natural deodorant recently and developed a rash.  She has been treating it with diaper rash ointment, and notes that it's healed a lot in a week and a half.     Problem  Contact dermatitis and eczema-bilateral axilla      Weight:  Wt Readings from Last 3 Encounters:  10/11/17 184 lb 4.8 oz (83.6 kg)  10/03/17 185 lb (83.9 kg)  08/29/17 188 lb 8 oz (85.5 kg)   BMI Readings from Last 3 Encounters:  10/11/17 32.65 kg/m  10/03/17 32.77 kg/m  08/29/17 33.39 kg/m   BP Readings from Last 3 Encounters:  10/11/17 127/77  10/03/17 124/83  08/29/17 137/90     Review of Systems: General:   No F/C, wt loss Pulm:   No DIB, SOB, pleuritic chest pain Card:  No CP, palpitations Abd:  No n/v/d or pain Ext:  No inc edema from baseline   Objective: Physical Exam: BP 124/83   Pulse 72   Ht 5\' 3"  (1.6 m)   Wt 185 lb (83.9 kg)   BMI 32.77 kg/m  Body mass index is 32.77 kg/m. General: Well nourished, in no apparent distress. Eyes: PERRLA, EOMs, conjunctiva clr no swelling or erythema Neck: supple Resp: Respiratory effort- normal, ECTA B/L w/o W/R/R  Cardio:  RRR w/o MRGs. Skin:  Erythematous thickened patches under axilla bilaterally, satellite erythematous papules on the periphery of the axilla.  Warm, dry without other rashes, lesions, ecchymosis.  Neuro: Alert, Oriented Psych: Normal affect, Insight and Judgment appropriate.    Current Medications:  Current Outpatient Medications on File Prior to Visit  Medication Sig Dispense Refill  . aspirin-acetaminophen-caffeine (EXCEDRIN MIGRAINE) 250-250-65 MG tablet Take 1-2 tablets by mouth every 6 (six) hours as needed for headache.    . Cholecalciferol (VITAMIN D) 2000 units CAPS Take 1 capsule by mouth daily.    . fexofenadine-pseudoephedrine (ALLEGRA-D ALLERGY & CONGESTION) 180-240 MG 24 hr tablet Take 1 tablet by mouth daily. 90 tablet 1  . fluticasone (FLONASE) 50 MCG/ACT nasal spray Place 1 spray into both nostrils 2 (two) times daily. 16 g 6  . Multiple Vitamin (MULTIVITAMIN) capsule Take 1 capsule by mouth daily.    . Norethindrone Acet-Ethinyl Est (LARIN 1/20 PO) Take 1 tablet by mouth daily.  No current facility-administered medications on file prior to visit.     Medical History:  Patient Active Problem List   Diagnosis Date Noted  . Contact dermatitis and eczema-bilateral axilla 10/11/2017  . Attention and concentration deficit 08/29/2017  . Carbuncle and furuncle of leg 03/08/2017  . Vitamin D insufficiency 05/22/2016  . Obesity, Class I, BMI 30-34.9 05/22/2016  . Low serum HDL 05/22/2016  . Acute left otitis media 03/03/2016  . Otitis, externa, infective 03/03/2016  . Pharyngitis 03/03/2016  . Fever, unspecified 03/03/2016  . Migraine headache 03/01/2016  . History of ADHD 03/01/2016  . Environmental and seasonal allergies 03/01/2016  . Viral URI 03/01/2016  . h/o Alopecia areata 05/22/2014    Allergies:  Allergies  Allergen Reactions  . Rizatriptan Swelling    Mouth swelling     Family history-  Reviewed; changed as appropriate  Social history-  Reviewed;  changed as appropriate

## 2017-10-05 MED FILL — NORETHIN-ESTRAD-FERR 1-0.02: 1-20 | 84 days supply | Qty: 84 | Fill #1

## 2017-10-11 ENCOUNTER — Ambulatory Visit (INDEPENDENT_AMBULATORY_CARE_PROVIDER_SITE_OTHER): Payer: 59 | Admitting: Family Medicine

## 2017-10-11 ENCOUNTER — Encounter: Payer: Self-pay | Admitting: Family Medicine

## 2017-10-11 VITALS — BP 127/77 | HR 82 | Ht 63.0 in | Wt 184.3 lb

## 2017-10-11 DIAGNOSIS — Z23 Encounter for immunization: Secondary | ICD-10-CM | POA: Diagnosis not present

## 2017-10-11 DIAGNOSIS — Z0000 Encounter for general adult medical examination without abnormal findings: Secondary | ICD-10-CM | POA: Diagnosis not present

## 2017-10-11 DIAGNOSIS — Z1159 Encounter for screening for other viral diseases: Secondary | ICD-10-CM | POA: Diagnosis not present

## 2017-10-11 DIAGNOSIS — E669 Obesity, unspecified: Secondary | ICD-10-CM | POA: Diagnosis not present

## 2017-10-11 DIAGNOSIS — Z719 Counseling, unspecified: Secondary | ICD-10-CM

## 2017-10-11 DIAGNOSIS — L259 Unspecified contact dermatitis, unspecified cause: Secondary | ICD-10-CM | POA: Insufficient documentation

## 2017-10-11 NOTE — Addendum Note (Signed)
Addended by: Leda Min D on: 10/11/2017 04:54 PM   Modules accepted: Orders

## 2017-10-11 NOTE — Patient Instructions (Addendum)
HPV (Human Papillomavirus) Vaccine: What You Need to Know 1. Why get vaccinated? HPV vaccine prevents infection with human papillomavirus (HPV) types that are associated with many cancers, including:  cervical cancer in females,  vaginal and vulvar cancers in females,  anal cancer in females and males,  throat cancer in females and males, and  penile cancer in males.  In addition, HPV vaccine prevents infection with HPV types that cause genital warts in both females and males. In the [place] about 12,000 women get cervical cancer every year, and about 4,000 women die from it. HPV vaccine can prevent most of these cases of cervical cancer. Vaccination is not a substitute for cervical cancer screening. This vaccine does not protect against all HPV types that can cause cervical cancer. Women should still get regular Pap tests. HPV infection usually comes from sexual contact, and most people will become infected at some point in their life. About 14 million Americans, including teens, get infected every year. Most infections will go away on their own and not cause serious problems. But thousands of women and men get cancer and other diseases from HPV. 2. HPV vaccine HPV vaccine is approved by FDA and is recommended by CDC for both males and females. It is routinely given at 37 or 26 years of age, but it may be given beginning at age 63 years through age 63 years. Most adolescents 9 through 26 years of age should get HPV vaccine as a two-dose series with the doses separated by 6-12 months. People who start HPV vaccination at 15 years of age and older should get the vaccine as a three-dose series with the second dose given 1-2 months after the first dose and the third dose given 6 months after the first dose. There are several exceptions to these age recommendations. Your health care provider can give you more information. 3. Some people should not get this vaccine  Anyone who has had a severe  (life-threatening) allergic reaction to a dose of HPV vaccine should not get another dose.  Anyone who has a severe (life threatening) allergy to any component of HPV vaccine should not get the vaccine.  Tell your doctor if you have any severe allergies that you know of, including a severe allergy to yeast.  HPV vaccine is not recommended for pregnant women. If you learn that you were pregnant when you were vaccinated, there is no reason to expect any problems for you or your baby. Any woman who learns she was pregnant when she got HPV vaccine is encouraged to contact the manufacturer's registry for HPV vaccination during pregnancy at (709)199-1399. Women who are breastfeeding may be vaccinated.  If you have a mild illness, such as a cold, you can probably get the vaccine today. If you are moderately or severely ill, you should probably wait until you recover. Your doctor can advise you. 4. Risks of a vaccine reaction With any medicine, including vaccines, there is a chance of side effects. These are usually mild and go away on their own, but serious reactions are also possible. Most people who get HPV vaccine do not have any serious problems with it. Mild or moderate problems following HPV vaccine:  Reactions in the arm where the shot was given: ? Soreness (about 9 people in 10) ? Redness or swelling (about 1 person in 3)  Fever: ? Mild (100F) (about 1 person in 10) ? Moderate (102F) (about 1 person in 74)  Other problems: ? Headache (about 1  person in 3) Problems that could happen after any injected vaccine:  People sometimes faint after a medical procedure, including vaccination. Sitting or lying down for about 15 minutes can help prevent fainting, and injuries caused by a fall. Tell your doctor if you feel dizzy, or have vision changes or ringing in the ears.  Some people get severe pain in the shoulder and have difficulty moving the arm where a shot was given. This happens very  rarely.  Any medication can cause a severe allergic reaction. Such reactions from a vaccine are very rare, estimated at about 1 in a million doses, and would happen within a few minutes to a few hours after the vaccination. As with any medicine, there is a very remote chance of a vaccine causing a serious injury or death. The safety of vaccines is always being monitored. For more information, visit: http://www.aguilar.org/. 5. What if there is a serious reaction? What should I look for? Look for anything that concerns you, such as signs of a severe allergic reaction, very high fever, or unusual behavior. Signs of a severe allergic reaction can include hives, swelling of the face and throat, difficulty breathing, a fast heartbeat, dizziness, and weakness. These would usually start a few minutes to a few hours after the vaccination. What should I do? If you think it is a severe allergic reaction or other emergency that can't wait, call 9-1-1 or get to the nearest hospital. Otherwise, call your doctor. Afterward, the reaction should be reported to the Vaccine Adverse Event Reporting System (VAERS). Your doctor should file this report, or you can do it yourself through the VAERS web site at www.vaers.SamedayNews.es, or by calling (386)150-6386. VAERS does not give medical advice. 6. The National Vaccine Injury Compensation Program The [employer] Vaccine Injury Compensation Program (VICP) is a federal program that was created to compensate people who may have been injured by certain vaccines. Persons who believe they may have been injured by a vaccine can learn about the program and about filing a claim by calling (360)581-7232 or visiting the [employer] website at GoldCloset.com.ee. There is a time limit to file a claim for compensation. 7. How can I learn more?  Ask your health care provider. He or she can give you the vaccine package insert or suggest other sources of information.  Call your  local or state health department.  Contact the Centers for Disease Control and Prevention (CDC): ? Call (770) 634-4870 (1-800-CDC-INFO) or ? Visit CDC's website at http://sweeney-todd.com/ Vaccine Information Statement, HPV Vaccine (05/14/2015) This information is not intended to replace advice given to you by your health care provider. Make sure you discuss any questions you have with your health care provider. Document Released: 12/24/2013 Document Revised: 02/17/2016 Document Reviewed: 02/17/2016 Elsevier Interactive Patient Education  2017 [place] for Adults, Female  A healthy lifestyle and preventive care can promote health and wellness. Preventive health guidelines for women include the following key practices.   A routine yearly physical is a good way to check with your health care provider about your health and preventive screening. It is a chance to share any concerns and updates on your health and to receive a thorough exam.   Visit your dentist for a routine exam and preventive care every 6 months. Brush your teeth twice a day and floss once a day. Good oral hygiene prevents tooth decay and gum disease.   The frequency of eye exams is based on your age, health, family  medical history, use of contact lenses, and other factors. Follow your health care provider's recommendations for frequency of eye exams.   Eat a healthy diet. Foods like vegetables, fruits, whole grains, low-fat dairy products, and lean protein foods contain the nutrients you need without too many calories. Decrease your intake of foods high in solid fats, added sugars, and salt. Eat the right amount of calories for you.Get information about a proper diet from your health care provider, if necessary.   Regular physical exercise is one of the most important things you can do for your health. Most adults should get at least 150 minutes of moderate-intensity exercise (any activity that increases  your heart rate and causes you to sweat) each week. In addition, most adults need muscle-strengthening exercises on 2 or more days a week.   Maintain a healthy weight. The body mass index (BMI) is a screening tool to identify possible weight problems. It provides an estimate of body fat based on height and weight. Your health care provider can find your BMI, and can help you achieve or maintain a healthy weight.For adults 20 years and older:   - A BMI below 18.5 is considered underweight.   - A BMI of 18.5 to 24.9 is normal.   - A BMI of 25 to 29.9 is considered overweight.   - A BMI of 30 and above is considered obese.   Maintain normal blood lipids and cholesterol levels by exercising and minimizing your intake of trans and saturated fats.  Eat a balanced diet with plenty of fruit and vegetables. Blood tests for lipids and cholesterol should begin at age 64 and be repeated every 5 years minimum.  If your lipid or cholesterol levels are high, you are over 40, or you are at high risk for heart disease, you may need your cholesterol levels checked more frequently.Ongoing high lipid and cholesterol levels should be treated with medicines if diet and exercise are not working.   If you smoke, find out from your health care provider how to quit. If you do not use tobacco, do not start.   Lung cancer screening is recommended for adults aged 13-80 years who are at high risk for developing lung cancer because of a history of smoking. A yearly low-dose CT scan of the lungs is recommended for people who have at least a 30-pack-year history of smoking and are a current smoker or have quit within the past 15 years. A pack year of smoking is smoking an average of 1 pack of cigarettes a day for 1 year (for example: 1 pack a day for 30 years or 2 packs a day for 15 years). Yearly screening should continue until the smoker has stopped smoking for at least 15 years. Yearly screening should be stopped for people who  develop a health problem that would prevent them from having lung cancer treatment.   If you are pregnant, do not drink alcohol. If you are breastfeeding, be very cautious about drinking alcohol. If you are not pregnant and choose to drink alcohol, do not have more than 1 drink per day. One drink is considered to be 12 ounces (355 mL) of beer, 5 ounces (148 mL) of wine, or 1.5 ounces (44 mL) of liquor.   Avoid use of street drugs. Do not share needles with anyone. Ask for help if you need support or instructions about stopping the use of drugs.   High blood pressure causes heart disease and increases the risk of stroke.  Your blood pressure should be checked at least yearly.  Ongoing high blood pressure should be treated with medicines if weight loss and exercise do not work.   If you are 17-63 years old, ask your health care provider if you should take aspirin to prevent strokes.   Diabetes screening involves taking a blood sample to check your fasting blood sugar level. This should be done once every 3 years, after age 49, if you are within normal weight and without risk factors for diabetes. Testing should be considered at a younger age or be carried out more frequently if you are overweight and have at least 1 risk factor for diabetes.   Breast cancer screening is essential preventive care for women. You should practice "breast self-awareness."  This means understanding the normal appearance and feel of your breasts and may include breast self-examination.  Any changes detected, no matter how small, should be reported to a health care provider.  Women in their 83s and 30s should have a clinical breast exam (CBE) by a health care provider as part of a regular health exam every 1 to 3 years.  After age 44, women should have a CBE every year.  Starting at age 48, women should consider having a mammogram (breast X-ray test) every year.  Women who have a family history of breast cancer should talk to  their health care provider about genetic screening.  Women at a high risk of breast cancer should talk to their health care providers about having an MRI and a mammogram every year.   -Breast cancer gene (BRCA)-related cancer risk assessment is recommended for women who have family members with BRCA-related cancers. BRCA-related cancers include breast, ovarian, tubal, and peritoneal cancers. Having family members with these cancers may be associated with an increased risk for harmful changes (mutations) in the breast cancer genes BRCA1 and BRCA2. Results of the assessment will determine the need for genetic counseling and BRCA1 and BRCA2 testing.   The Pap test is a screening test for cervical cancer. A Pap test can show cell changes on the cervix that might become cervical cancer if left untreated. A Pap test is a procedure in which cells are obtained and examined from the lower end of the uterus (cervix).   - Women should have a Pap test starting at age 24.   - Between ages 36 and 54, Pap tests should be repeated every 2 years.   - Beginning at age 19, you should have a Pap test every 3 years as long as the past 3 Pap tests have been normal.   - Some women have medical problems that increase the chance of getting cervical cancer. Talk to your health care provider about these problems. It is especially important to talk to your health care provider if a new problem develops soon after your last Pap test. In these cases, your health care provider may recommend more frequent screening and Pap tests.   - The above recommendations are the same for women who have or have not gotten the vaccine for human papillomavirus (HPV).   - If you had a hysterectomy for a problem that was not cancer or a condition that could lead to cancer, then you no longer need Pap tests. Even if you no longer need a Pap test, a regular exam is a good idea to make sure no other problems are starting.   - If you are between  ages 26 and 65 years, and you have had  normal Pap tests going back 10 years, you no longer need Pap tests. Even if you no longer need a Pap test, a regular exam is a good idea to make sure no other problems are starting.   - If you have had past treatment for cervical cancer or a condition that could lead to cancer, you need Pap tests and screening for cancer for at least 20 years after your treatment.   - If Pap tests have been discontinued, risk factors (such as a new sexual partner) need to be reassessed to determine if screening should be resumed.   - The HPV test is an additional test that may be used for cervical cancer screening. The HPV test looks for the virus that can cause the cell changes on the cervix. The cells collected during the Pap test can be tested for HPV. The HPV test could be used to screen women aged 63 years and older, and should be used in women of any age who have unclear Pap test results. After the age of 63, women should have HPV testing at the same frequency as a Pap test.   Colorectal cancer can be detected and often prevented. Most routine colorectal cancer screening begins at the age of 46 years and continues through age 81 years. However, your health care provider may recommend screening at an earlier age if you have risk factors for colon cancer. On a yearly basis, your health care provider may provide home test kits to check for hidden blood in the stool.  Use of a small camera at the end of a tube, to directly examine the colon (sigmoidoscopy or colonoscopy), can detect the earliest forms of colorectal cancer. Talk to your health care provider about this at age 87, when routine screening begins. Direct exam of the colon should be repeated every 5 -10 years through age 25 years, unless early forms of pre-cancerous polyps or small growths are found.   People who are at an increased risk for hepatitis B should be screened for this virus. You are considered at high risk  for hepatitis B if:  -You were born in a country where hepatitis B occurs often. Talk with your health care provider about which countries are considered high risk.  - Your parents were born in a high-risk country and you have not received a shot to protect against hepatitis B (hepatitis B vaccine).  - You have HIV or AIDS.  - You use needles to inject street drugs.  - You live with, or have sex with, someone who has Hepatitis B.  - You get hemodialysis treatment.  - You take certain medicines for conditions like cancer, organ transplantation, and autoimmune conditions.   Hepatitis C blood testing is recommended for all people born from [birth year] through 1964 and any individual with known risks for hepatitis C.   Practice safe sex. Use condoms and avoid high-risk sexual practices to reduce the spread of sexually transmitted infections (STIs). STIs include gonorrhea, chlamydia, syphilis, trichomonas, herpes, HPV, and human immunodeficiency virus (HIV). Herpes, HIV, and HPV are viral illnesses that have no cure. They can result in disability, cancer, and death. Sexually active women aged 14 years and younger should be checked for chlamydia. Older women with new or multiple partners should also be tested for chlamydia. Testing for other STIs is recommended if you are sexually active and at increased risk.   Osteoporosis is a disease in which the bones lose minerals and strength with aging. This can  result in serious bone fractures or breaks. The risk of osteoporosis can be identified using a bone density scan. Women ages 63 years and over and women at risk for fractures or osteoporosis should discuss screening with their health care providers. Ask your health care provider whether you should take a calcium supplement or vitamin D to There are also several preventive steps women can take to avoid osteoporosis and resulting fractures or to keep osteoporosis from worsening. -->Recommendations include:  Eat a  balanced diet high in fruits, vegetables, calcium, and vitamins.  Get enough calcium. The recommended total intake of is 1,200 mg daily; for best absorption, if taking supplements, divide doses into 250-500 mg doses throughout the day. Of the two types of calcium, calcium carbonate is best absorbed when taken with food but calcium citrate can be taken on an empty stomach.  Get enough vitamin D. [employer]AMS and the [employer] recommend at least 1,000 IU per day for women age 63 and over who are at risk of vitamin D deficiency. Vitamin D deficiency can be caused by inadequate sun exposure (for example, those who live in [place]).  Avoid alcohol and smoking. Heavy alcohol intake (more than 7 drinks per week) increases the risk of falls and hip fracture and women smokers tend to lose bone more rapidly and have lower bone mass than nonsmokers. Stopping smoking is one of the most important changes women can make to improve their health and decrease risk for disease.  Be physically active every day. Weight-bearing exercise (for example, fast walking, hiking, jogging, and weight training) may strengthen bones or slow the rate of bone loss that comes with aging. Balancing and muscle-strengthening exercises can reduce the risk of falling and fracture.  Consider therapeutic medications. Currently, several types of effective drugs are available. Healthcare providers can recommend the type most appropriate for each woman.  Eliminate environmental factors that may contribute to accidents. Falls cause nearly 90% of all osteoporotic fractures, so reducing this risk is an important bone-health strategy. Measures include ample lighting, removing obstructions to walking, using nonskid rugs on floors, and placing mats and/or grab bars in showers.  Be aware of medication side effects. Some common medicines make bones weaker. These include a type of steroid drug called glucocorticoids used for  arthritis and asthma, some antiseizure drugs, certain sleeping pills, treatments for endometriosis, and some cancer drugs. An overactive thyroid gland or using too much thyroid hormone for an underactive thyroid can also be a problem. If you are taking these medicines, talk to your doctor about what you can do to help protect your bones.reduce the rate of osteoporosis.    Menopause can be associated with physical symptoms and risks. Hormone replacement therapy is available to decrease symptoms and risks. You should talk to your health care provider about whether hormone replacement therapy is right for you.   Use sunscreen. Apply sunscreen liberally and repeatedly throughout the day. You should seek shade when your shadow is shorter than you. Protect yourself by wearing long sleeves, pants, a wide-brimmed hat, and sunglasses year round, whenever you are outdoors.   Once a month, do a whole body skin exam, using a mirror to look at the skin on your back. Tell your health care provider of new moles, moles that have irregular borders, moles that are larger than a pencil eraser, or moles that have changed in shape or color.   -Stay current with required vaccines (immunizations).   Influenza vaccine. All adults should be  immunized every year.  Tetanus, diphtheria, and acellular pertussis (Td, Tdap) vaccine. Pregnant women should receive 1 dose of Tdap vaccine during each pregnancy. The dose should be obtained regardless of the length of time since the last dose. Immunization is preferred during the 27th 36th week of gestation. An adult who has not previously received Tdap or who does not know her vaccine status should receive 1 dose of Tdap. This initial dose should be followed by tetanus and diphtheria toxoids (Td) booster doses every 10 years. Adults with an unknown or incomplete history of completing a 3-dose immunization series with Td-containing vaccines should begin or complete a primary  immunization series including a Tdap dose. Adults should receive a Td booster every 10 years.  Varicella vaccine. An adult without evidence of immunity to varicella should receive 2 doses or a second dose if she has previously received 1 dose. Pregnant females who do not have evidence of immunity should receive the first dose after pregnancy. This first dose should be obtained before leaving the health care facility. The second dose should be obtained 4 8 weeks after the first dose.  Human papillomavirus (HPV) vaccine. Females aged 13 26 years who have not received the vaccine previously should obtain the 3-dose series. The vaccine is not recommended for use in pregnant females. However, pregnancy testing is not needed before receiving a dose. If a female is found to be pregnant after receiving a dose, no treatment is needed. In that case, the remaining doses should be delayed until after the pregnancy. Immunization is recommended for any person with an immunocompromised condition through the age of 61 years if she did not get any or all doses earlier. During the 3-dose series, the second dose should be obtained 4 8 weeks after the first dose. The third dose should be obtained 24 weeks after the first dose and 16 weeks after the second dose.  Zoster vaccine. One dose is recommended for adults aged 27 years or older unless certain conditions are present.  Measles, mumps, and rubella (MMR) vaccine. Adults born before [birth year] generally are considered immune to measles and mumps. Adults born in [birth year] or later should have 1 or more doses of MMR vaccine unless there is a contraindication to the vaccine or there is laboratory evidence of immunity to each of the three diseases. A routine second dose of MMR vaccine should be obtained at least 28 days after the first dose for students attending postsecondary schools, health care workers, or international travelers. People who received inactivated measles vaccine or an  unknown type of measles vaccine during 1963 1967 should receive 2 doses of MMR vaccine. People who received inactivated mumps vaccine or an unknown type of mumps vaccine before 1979 and are at high risk for mumps infection should consider immunization with 2 doses of MMR vaccine. For females of childbearing age, rubella immunity should be determined. If there is no evidence of immunity, females who are not pregnant should be vaccinated. If there is no evidence of immunity, females who are pregnant should delay immunization until after pregnancy. Unvaccinated health care workers born before [birth year] who lack laboratory evidence of measles, mumps, or rubella immunity or laboratory confirmation of disease should consider measles and mumps immunization with 2 doses of MMR vaccine or rubella immunization with 1 dose of MMR vaccine.  Pneumococcal 13-valent conjugate (PCV13) vaccine. When indicated, a person who is uncertain of her immunization history and has no record of immunization should receive the PCV13 vaccine. An adult  aged 78 years or older who has certain medical conditions and has not been previously immunized should receive 1 dose of PCV13 vaccine. This PCV13 should be followed with a dose of pneumococcal polysaccharide (PPSV23) vaccine. The PPSV23 vaccine dose should be obtained at least 8 weeks after the dose of PCV13 vaccine. An adult aged 9 years or older who has certain medical conditions and previously received 1 or more doses of PPSV23 vaccine should receive 1 dose of PCV13. The PCV13 vaccine dose should be obtained 1 or more years after the last PPSV23 vaccine dose.  Pneumococcal polysaccharide (PPSV23) vaccine. When PCV13 is also indicated, PCV13 should be obtained first. All adults aged 33 years and older should be immunized. An adult younger than age 53 years who has certain medical conditions should be immunized. Any person who resides in a nursing home or long-term care facility should be  immunized. An adult smoker should be immunized. People with an immunocompromised condition and certain other conditions should receive both PCV13 and PPSV23 vaccines. People with human immunodeficiency virus (HIV) infection should be immunized as soon as possible after diagnosis. Immunization during chemotherapy or radiation therapy should be avoided. Routine use of PPSV23 vaccine is not recommended for American Indians, [place] Natives, or people younger than 65 years unless there are medical conditions that require PPSV23 vaccine. When indicated, people who have unknown immunization and have no record of immunization should receive PPSV23 vaccine. One-time revaccination 5 years after the first dose of PPSV23 is recommended for people aged 73 64 years who have chronic kidney failure, nephrotic syndrome, asplenia, or immunocompromised conditions. People who received 1 2 doses of PPSV23 before age 47 years should receive another dose of PPSV23 vaccine at age 15 years or later if at least 5 years have passed since the previous dose. Doses of PPSV23 are not needed for people immunized with PPSV23 at or after age 60 years.  Meningococcal vaccine. Adults with asplenia or persistent complement component deficiencies should receive 2 doses of quadrivalent meningococcal conjugate (MenACWY-D) vaccine. The doses should be obtained at least 2 months apart. Microbiologists working with certain meningococcal bacteria, [employer] recruits, people at risk during an outbreak, and people who travel to or live in countries with a high rate of meningitis should be immunized. A first-year college student up through age 52 years who is living in a residence hall should receive a dose if she did not receive a dose on or after her 16th birthday. Adults who have certain high-risk conditions should receive one or more doses of vaccine.  Hepatitis A vaccine. Adults who wish to be protected from this disease, have certain high-risk  conditions, work with hepatitis A-infected animals, work in hepatitis A research labs, or travel to or work in countries with a high rate of hepatitis A should be immunized. Adults who were previously unvaccinated and who anticipate close contact with an international adoptee during the first 60 days after arrival in the [place] from a country with a high rate of hepatitis A should be immunized.  Hepatitis B vaccine.  Adults who wish to be protected from this disease, have certain high-risk conditions, may be exposed to blood or other infectious body fluids, are household contacts or sex partners of hepatitis B positive people, are clients or workers in certain care facilities, or travel to or work in countries with a high rate of hepatitis B should be immunized.  Haemophilus influenzae type b (Hib) vaccine. A previously unvaccinated person with asplenia or  sickle cell disease or having a scheduled splenectomy should receive 1 dose of Hib vaccine. Regardless of previous immunization, a recipient of a hematopoietic stem cell transplant should receive a 3-dose series 6 12 months after her successful transplant. Hib vaccine is not recommended for adults with HIV infection.  Preventive Services / Frequency Ages 4 to 39years  Blood pressure check.** / Every 1 to 2 years.  Lipid and cholesterol check.** / Every 5 years beginning at age 8.  Clinical breast exam.** / Every 3 years for women in their 1s and 75s.  BRCA-related cancer risk assessment.** / For women who have family members with a BRCA-related cancer (breast, ovarian, tubal, or peritoneal cancers).  Pap test.** / Every 2 years from ages 72 through 43. Every 3 years starting at age 50 through age 26 or 81 with a history of 3 consecutive normal Pap tests.  HPV screening.** / Every 3 years from ages 105 through ages 16 to 51 with a history of 3 consecutive normal Pap tests.  Hepatitis C blood test.** / For any individual with known  risks for hepatitis C.  Skin self-exam. / Monthly.  Influenza vaccine. / Every year.  Tetanus, diphtheria, and acellular pertussis (Tdap, Td) vaccine.** / Consult your health care provider. Pregnant women should receive 1 dose of Tdap vaccine during each pregnancy. 1 dose of Td every 10 years.  Varicella vaccine.** / Consult your health care provider. Pregnant females who do not have evidence of immunity should receive the first dose after pregnancy.  HPV vaccine. / 3 doses over 6 months, if 67 and younger. The vaccine is not recommended for use in pregnant females. However, pregnancy testing is not needed before receiving a dose.  Measles, mumps, rubella (MMR) vaccine.** / You need at least 1 dose of MMR if you were born in 1957 or later. You may also need a 2nd dose. For females of childbearing age, rubella immunity should be determined. If there is no evidence of immunity, females who are not pregnant should be vaccinated. If there is no evidence of immunity, females who are pregnant should delay immunization until after pregnancy.  Pneumococcal 13-valent conjugate (PCV13) vaccine.** / Consult your health care provider.  Pneumococcal polysaccharide (PPSV23) vaccine.** / 1 to 2 doses if you smoke cigarettes or if you have certain conditions.  Meningococcal vaccine.** / 1 dose if you are age 88 to 54 years and a [employer] researcher living in a residence hall, or have one of several medical conditions, you need to get vaccinated against meningococcal disease. You may also need additional booster doses.  Hepatitis A vaccine.** / Consult your health care provider.  Hepatitis B vaccine.** / Consult your health care provider.  Haemophilus influenzae type b (Hib) vaccine.** / Consult your health care provider.  Ages 63 to 64years  Blood pressure check.** / Every 1 to 2 years.  Lipid and cholesterol check.** / Every 5 years beginning at age 12 years.  Lung cancer screening. / Every  year if you are aged 96 80 years and have a 30-pack-year history of smoking and currently smoke or have quit within the past 15 years. Yearly screening is stopped once you have quit smoking for at least 15 years or develop a health problem that would prevent you from having lung cancer treatment.  Clinical breast exam.** / Every year after age 8 years.  BRCA-related cancer risk assessment.** / For women who have family members with a BRCA-related cancer (breast, ovarian, tubal, or peritoneal cancers).  Mammogram.** / Every year beginning at age 71 years and continuing for as long as you are in good health. Consult with your health care provider.  Pap test.** / Every 3 years starting at age 67 years through age 65 or 75 years with a history of 3 consecutive normal Pap tests.  HPV screening.** / Every 3 years from ages 15 years through ages 76 to 74 years with a history of 3 consecutive normal Pap tests.  Fecal occult blood test (FOBT) of stool. / Every year beginning at age 25 years and continuing until age 72 years. You may not need to do this test if you get a colonoscopy every 10 years.  Flexible sigmoidoscopy or colonoscopy.** / Every 5 years for a flexible sigmoidoscopy or every 10 years for a colonoscopy beginning at age 20 years and continuing until age 33 years.  Hepatitis C blood test.** / For all people born from [birth year] through 1964 and any individual with known risks for hepatitis C.  Skin self-exam. / Monthly.  Influenza vaccine. / Every year.  Tetanus, diphtheria, and acellular pertussis (Tdap/Td) vaccine.** / Consult your health care provider. Pregnant women should receive 1 dose of Tdap vaccine during each pregnancy. 1 dose of Td every 10 years.  Varicella vaccine.** / Consult your health care provider. Pregnant females who do not have evidence of immunity should receive the first dose after pregnancy.  Zoster vaccine.** / 1 dose for adults aged 27 years or older.  Measles,  mumps, rubella (MMR) vaccine.** / You need at least 1 dose of MMR if you were born in 1957 or later. You may also need a 2nd dose. For females of childbearing age, rubella immunity should be determined. If there is no evidence of immunity, females who are not pregnant should be vaccinated. If there is no evidence of immunity, females who are pregnant should delay immunization until after pregnancy.  Pneumococcal 13-valent conjugate (PCV13) vaccine.** / Consult your health care provider.  Pneumococcal polysaccharide (PPSV23) vaccine.** / 1 to 2 doses if you smoke cigarettes or if you have certain conditions.  Meningococcal vaccine.** / Consult your health care provider.  Hepatitis A vaccine.** / Consult your health care provider.  Hepatitis B vaccine.** / Consult your health care provider.  Haemophilus influenzae type b (Hib) vaccine.** / Consult your health care provider.  Ages 64 years and over  Blood pressure check.** / Every 1 to 2 years.  Lipid and cholesterol check.** / Every 5 years beginning at age 57 years.  Lung cancer screening. / Every year if you are aged 78 80 years and have a 30-pack-year history of smoking and currently smoke or have quit within the past 15 years. Yearly screening is stopped once you have quit smoking for at least 15 years or develop a health problem that would prevent you from having lung cancer treatment.  Clinical breast exam.** / Every year after age 108 years.  BRCA-related cancer risk assessment.** / For women who have family members with a BRCA-related cancer (breast, ovarian, tubal, or peritoneal cancers).  Mammogram.** / Every year beginning at age 63 years and continuing for as long as you are in good health. Consult with your health care provider.  Pap test.** / Every 3 years starting at age 33 years through age 19 or 19 years with 3 consecutive normal Pap tests. Testing can be stopped between 65 and 70 years with 3 consecutive normal Pap tests  and no abnormal Pap or HPV tests in the past  10 years.  HPV screening.** / Every 3 years from ages 22 years through ages 34 or 26 years with a history of 3 consecutive normal Pap tests. Testing can be stopped between 65 and 70 years with 3 consecutive normal Pap tests and no abnormal Pap or HPV tests in the past 10 years.  Fecal occult blood test (FOBT) of stool. / Every year beginning at age 31 years and continuing until age 15 years. You may not need to do this test if you get a colonoscopy every 10 years.  Flexible sigmoidoscopy or colonoscopy.** / Every 5 years for a flexible sigmoidoscopy or every 10 years for a colonoscopy beginning at age 20 years and continuing until age 44 years.  Hepatitis C blood test.** / For all people born from [birth year] through 1964 and any individual with known risks for hepatitis C.  Osteoporosis screening.** / A one-time screening for women ages 45 years and over and women at risk for fractures or osteoporosis.  Skin self-exam. / Monthly.  Influenza vaccine. / Every year.  Tetanus, diphtheria, and acellular pertussis (Tdap/Td) vaccine.** / 1 dose of Td every 10 years.  Varicella vaccine.** / Consult your health care provider.  Zoster vaccine.** / 1 dose for adults aged 62 years or older.  Pneumococcal 13-valent conjugate (PCV13) vaccine.** / Consult your health care provider.  Pneumococcal polysaccharide (PPSV23) vaccine.** / 1 dose for all adults aged 37 years and older.  Meningococcal vaccine.** / Consult your health care provider.  Hepatitis A vaccine.** / Consult your health care provider.  Hepatitis B vaccine.** / Consult your health care provider.  Haemophilus influenzae type b (Hib) vaccine.** / Consult your health care provider. ** Family history and personal history of risk and conditions may change your health care provider's recommendations. Document Released: 07/25/2001 Document Revised: 03/19/2013  [facility] Patient Information 2014  [place], [place].   EXERCISE AND DIET:  We recommended that you start or continue a regular exercise program for good health. Regular exercise means any activity that makes your heart beat faster and makes you sweat.  We recommend exercising at least 30 minutes per day at least 3 days a week, preferably 5.  We also recommend a diet low in fat and sugar / carbohydrates.  Inactivity, poor dietary choices and obesity can cause diabetes, heart attack, stroke, and kidney damage, among others.     ALCOHOL AND SMOKING:  Women should limit their alcohol intake to no more than 7 drinks/beers/glasses of wine (combined, not each!) per week. Moderation of alcohol intake to this level decreases your risk of breast cancer and liver damage.  ( And of course, no recreational drugs are part of a healthy lifestyle.)  Also, you should not be smoking at all or even being exposed to second hand smoke. Most people know smoking can cause cancer, and various heart and lung diseases, but did you know it also contributes to weakening of your bones?  Aging of your skin?  Yellowing of your teeth and nails?   CALCIUM AND VITAMIN D:  Adequate intake of calcium and Vitamin D are recommended.  The recommendations for exact amounts of these supplements seem to change often, but generally speaking 600 mg of calcium (either carbonate or citrate) and 800 units of Vitamin D per day seems prudent. Certain women may benefit from higher intake of Vitamin D.  If you are among these women, your doctor will have told you during your visit.     PAP SMEARS:  Pap smears, to  check for cervical cancer or precancers,  have traditionally been done yearly, although recent scientific advances have shown that most women can have pap smears less often.  However, every woman still should have a physical exam from her gynecologist or primary care physician every year. It will include a breast check, inspection of the vulva and vagina to check for abnormal  growths or skin changes, a visual exam of the cervix, and then an exam to evaluate the size and shape of the uterus and ovaries.  And after 26 years of age, a rectal exam is indicated to check for rectal cancers. We will also provide age appropriate advice regarding health maintenance, like when you should have certain vaccines, screening for sexually transmitted diseases, bone density testing, colonoscopy, mammograms, etc.    MAMMOGRAMS:  All women over 47 years old should have a yearly mammogram. Many facilities now offer a "3D" mammogram, which may cost around $50 extra out of pocket. If possible,  we recommend you accept the option to have the 3D mammogram performed.  It both reduces the number of women who will be called back for extra views which then turn out to be normal, and it is better than the routine mammogram at detecting truly abnormal areas.     COLONOSCOPY:  Colonoscopy to screen for colon cancer is recommended for all women at age 51.  We know, you hate the idea of the prep.  We agree, BUT, having colon cancer and not knowing it is worse!!  Colon cancer so often starts as a polyp that can be seen and removed at colonscopy, which can quite literally save your life!  And if your first colonoscopy is normal and you have no family history of colon cancer, most women don't have to have it again for 10 years.  Once every ten years, you can do something that may end up saving your life, right?  We will be happy to help you get it scheduled when you are ready.  Be sure to check your insurance coverage so you understand how much it will cost.  It may be covered as a preventative service at no cost, but you should check your particular policy.

## 2017-10-11 NOTE — Progress Notes (Signed)
Impression and Recommendations:    1. Encounter for wellness examination   2. Health education/counseling   3. Need for Tdap vaccination   4. Obesity, Class I, BMI 30-34.9     1) Anticipatory Guidance: Discussed importance of wearing a seatbelt while driving, not texting while driving; sunscreen when outside along with yearly skin surveillance; eating a well balanced and modest diet; physical activity at least 25 minutes per day or 150 min/ week of moderate to intense activity.  2) Immunizations / Screenings / Labs:  All immunizations and screenings that patient agrees to, are up-to-date per recommendations or will be updated today.  Patient understands the needs for q 62mo dental and yearly vision screens which pt will schedule independently. Obtain CBC, CMP, HgA1c, Lipid panel, TSH and vit D when fasting if not already done recently.   Last labs were drawn on 05/14/2017.  - Dentist - Advised patient to visit the dentist once every six months.  - Information provided today on HPV vaccination.  - Will assess patient status on meningococcal vaccination.  3) Weight:   Discussed goal of losing even 5-10% of current body weight which would improve overall feelings of well being and improve objective health data significantly.   Improve nutrient density of diet through increasing intake of fruits and vegetables and decreasing saturated/trans fats, white flour products and refined sugar products.   - BMI Counseling Explained to patient what BMI refers to, and what it means medically.    Told patient to think about it as a "medical risk stratification measurement" and how increasing BMI is associated with increasing risk/ or worsening state of various diseases such as hypertension, hyperlipidemia, diabetes, premature OA, depression etc.  American Heart Association guidelines for healthy diet, basically Mediterranean diet, and exercise guidelines of 30 minutes 5 days per week or more  discussed in detail.  Extensive health counseling performed.  All questions answered.  - Exercise & Dietary Habits - Advised patient to continue working toward exercising to improve health.    - Patient should strive for 30 minutes of activity daily.  Recommended that the patient eventually strive for at least 150 minutes of moderate cardiovascular activity per week according to guidelines established by the [facility].   - Healthy dietary habits encouraged, including low-carb, and high amounts of lean protein in diet.  - Discussed the link between attention deficit and high-carb diet.  Advised protein and fiber with each meal.  - Patient should also consume adequate amounts of water - half of body weight in oz of water per day.  4) Follow-Up - Patient knows that she can come consult with [place] to talk about diet and exercise whenever needed.  Recent Acute Dermatitis in Bilateral Axilla - Resolved - Continue using treatment as prescribed.  Patient may resume use of deodorant as she feels appropriate.  - She will return for regularly scheduled chronic follow up.   No orders of the defined types were placed in this encounter.   No orders of the defined types were placed in this encounter.   Gross side effects, risk and benefits, and alternatives of medications discussed with patient.  Patient is aware that all medications have potential side effects and we are unable to predict every side effect or drug-drug interaction that may occur.  Expresses verbal understanding and consents to current therapy plan and treatment regimen.  F-up preventative CPE in 1 year. F/up sooner for chronic care management as discussed and/or prn.  Please see orders  placed and AVS handed out to patient at the end of our visit for further patient instructions/ counseling done pertaining to today's office visit.  This document serves as a record of services personally performed by Thomasene Lot, DO. It was created on  her behalf by [name], a trained medical scribe. The creation of this record is based on the scribe's personal observations and the provider's statements to them.   I have reviewed the above medical documentation for accuracy and completeness and I concur.  Thomasene Lot 10/11/17 9:29 AM     Subjective:    Chief Complaint  Patient presents with  . Annual Exam   CC:   HPI: Stefanie White is a 27 y.o. female who presents to [facility] today a yearly health maintenance exam.  Health Maintenance Summary Reviewed and updated, unless pt declines services.  Tobacco History Reviewed:   Y  Alcohol:    No concerns, no excessive use Exercise Habits:   Not meeting AHA guidelines STD concerns:   none Drug Use:   None Birth control method:   n/a Menses regular:     n/a Lumps or breast concerns:      no Breast Cancer Family History:      No  No family history present of colon cancer, breast cancer, ore female cancers at young ages.  Last labs were drawn on 05/14/2017.  Immunizations Patient can't remember the date of her last TDAP, and plans on updating today.  Patient believes that her last TDAP was around age 58, which is over 5 years ago.  Patient has not received HPV vaccinations.  Screenings HIV screening was last done 12/14/2016, but Hepatitis C was not done.  OBGYN Health Patient has an OBGYN that she follows up with regularly.  Eye Health Believes that last dilated eye exam was in December.  Dermatological Her armpit dermatitis has cleared up significantly since following her prescribed treatment.  Exercise & Diet Patient notes that she has only exercised once this week.  Patient uses MyFitnessPal to track.  Hasn't had any sweets, sodas, or alcohol since starting her diet.  She is down one pound.  Patient denies issues with GI tract, constipation, diarrhea, GI upset.  "As long as I eat the right food in the right  amounts, I'm fine."  Patient knows when she isn't drinking enough water because she wakes up with cramps in her legs.   Immunization History  Administered Date(s) Administered  . Influenza-Unspecified 02/11/2015, 03/12/2017    Health Maintenance  Topic Date Due  . TETANUS/TDAP  08/30/2018 (Originally 12/16/2010)  . INFLUENZA VACCINE  01/10/2018  . PAP SMEAR  05/18/2018  . HIV Screening  Completed     Wt Readings from Last 3 Encounters:  10/11/17 184 lb 4.8 oz (83.6 kg)  10/03/17 185 lb (83.9 kg)  08/29/17 188 lb 8 oz (85.5 kg)   BP Readings from Last 3 Encounters:  10/11/17 127/77  10/03/17 124/83  08/29/17 137/90   Pulse Readings from Last 3 Encounters:  10/11/17 82  10/03/17 72  08/29/17 90     Past Medical History:  Diagnosis Date  . Alopecia       No past surgical history on file.    Family History  Problem Relation Age of Onset  . Diabetes Father 41  . Hypertension Father 64  . Depression Brother 10       bipolar  . Cancer Maternal Aunt 45  colon  . Heart attack Paternal Grandmother 48      Social History   Substance and Sexual Activity  Drug Use No  ,   Social History   Substance and Sexual Activity  Alcohol Use Yes  . Alcohol/week: 0.6 oz  . Types: 1 Glasses of wine per week  ,   Social History   Tobacco Use  Smoking Status Former Smoker  . Packs/day: 0.25  . Years: 1.00  . Pack years: 0.25  . Types: Cigarettes  . Last attempt to quit: 06/12/2010  . Years since quitting: 7.3  Smokeless Tobacco Never Used  ,   Social History   Substance and Sexual Activity  Sexual Activity Yes  . Birth control/protection: Implant    Current Outpatient Medications on File Prior to Visit  Medication Sig Dispense Refill  . [START ON 10/24/2017] amphetamine-dextroamphetamine (ADDERALL XR) 10 MG 24 hr capsule Take 1 capsule (10 mg total) by mouth daily. 90 capsule 0  . aspirin-acetaminophen-caffeine (EXCEDRIN MIGRAINE) 250-250-65 MG  tablet Take 1-2 tablets by mouth every 6 (six) hours as needed for headache.    . Cholecalciferol (VITAMIN D) 2000 units CAPS Take 1 capsule by mouth daily.    . fexofenadine-pseudoephedrine (ALLEGRA-D ALLERGY & CONGESTION) 180-240 MG 24 hr tablet Take 1 tablet by mouth daily. 90 tablet 1  . fluticasone (FLONASE) 50 MCG/ACT nasal spray Place 1 spray into both nostrils 2 (two) times daily. 16 g 6  . Multiple Vitamin (MULTIVITAMIN) capsule Take 1 capsule by mouth daily.    . Norethindrone Acet-Ethinyl Est (LARIN 1/20 PO) Take 1 tablet by mouth daily.    Marland Kitchen nystatin-triamcinolone ointment (MYCOLOG) Applied to rash twice daily until resolved 60 g 0   No current facility-administered medications on file prior to visit.     Allergies: Rizatriptan  Review of Systems: General:   Denies fever, chills, unexplained weight loss.  Optho/Auditory:   Denies visual changes, blurred vision/LOV Respiratory:   Denies SOB, DOE more than baseline levels.  Cardiovascular:   Denies chest pain, palpitations, new onset peripheral edema  Gastrointestinal:   Denies nausea, vomiting, diarrhea.  Genitourinary: Denies dysuria, freq/ urgency, flank pain or discharge from genitals.  Endocrine:     Denies hot or cold intolerance, polyuria, polydipsia. Musculoskeletal:   Denies unexplained myalgias, joint swelling, unexplained arthralgias, gait problems.  Skin:  Denies rash, suspicious lesions Neurological:     Denies dizziness, unexplained weakness, numbness  Psychiatric/Behavioral:   Denies mood changes, suicidal or homicidal ideations, hallucinations    Objective:    Blood pressure 127/77, pulse 82, height  (1.6 m), weight 184 lb 4.8 oz (83.6 kg), SpO2 99 %. Body mass index is 32.65 kg/m. General Appearance:    Alert, cooperative, no distress, appears stated age  Head:    Normocephalic, without obvious abnormality, atraumatic  Eyes:    PERRL, conjunctiva/corneas clear, EOM's intact, fundi    benign, both  eyes  Ears:    Normal TM's and external ear canals, both ears  Nose:   Nares normal, septum midline, mucosa normal, no drainage    or sinus tenderness  Throat:   Lips w/o lesion, mucosa moist, and tongue normal; teeth and   gums normal  Neck:   Supple, symmetrical, trachea midline, no adenopathy;    thyroid:  no enlargement/tenderness/nodules; no carotid   bruit or JVD  Back:     Symmetric, no curvature, ROM normal, no CVA tenderness  Lungs:     Clear to auscultation bilaterally, respirations  unlabored, no       Wh/ R/ R  Chest Wall:    No tenderness or gross deformity; normal excursion   Heart:    Regular rate and rhythm, S1 and S2 normal, no murmur, rub   or gallop  Breast Exam:    No tenderness, masses, or nipple abnormality b/l; no d/c  Abdomen:     Soft, non-tender, bowel sounds active all four quadrants, NO   G/R/R, no masses, no organomegaly  Genitalia:    Not performed.  Rectal:    Not performed.  Extremities:   Extremities normal, atraumatic, no cyanosis or gross edema  Pulses:   2+ and symmetric all extremities  Skin:   Warm, dry, Skin color, texture, turgor normal, no obvious rashes or lesions Psych: No HI/SI, judgement and insight good, Euthymic mood. Full Affect.  Neurologic:   CNII-XII intact, normal strength, sensation and reflexes    Throughout

## 2017-11-01 MED FILL — ADDERALL XR 10 MG CAP SA: 10 | 90 days supply | Qty: 90 | Fill #0

## 2017-11-20 ENCOUNTER — Telehealth: Payer: 59 | Admitting: Family

## 2017-11-20 DIAGNOSIS — R0602 Shortness of breath: Secondary | ICD-10-CM

## 2017-11-20 NOTE — Progress Notes (Signed)
Based on what you shared with me it looks like you have a condition that should be evaluated in a face to face office visit.  NOTE: If you entered your credit card information for this eVisit, you will not be charged. You may see a "hold" on your card for the $30 but that hold will drop off and you will not have a charge processed.  If you are having a true medical emergency please call 911.  If you need an urgent face to face visit, [facility] has four urgent care centers for your convenience.  If you need care fast and have a high deductible or no insurance consider:   [email or web address] to reserve your spot online an avoid wait times  [facility] [address], [address] [place], [place] [address] 8 am to 8 pm Monday-Friday 10 am to 4 pm Saturday-Sunday *Across the street from [employer]  [facility]  [address] [place], [address] 8 am to 5 pm Monday-Friday * In the [facility]   The following sites will take your  insurance:  . [facility]  [phone] Get Driving Directions Find a Provider at this Location  [address] , [place] [address] . 10 am to 8 pm Monday-Friday . 12 pm to 8 pm Saturday-Sunday   . [facility]  [phone] Get Driving Directions Find a Provider at this Location  [address], [address] [place], [place] [address] . 8 am to 8 pm Monday-Friday . 9 am to 6 pm Saturday . 11 am to 6 pm Sunday   . [facility] [place]  [phone] Get Driving Directions  [address] [place], [place] [address] . 8 am to 8 pm Monday-Friday . 8 am to 4 pm Saturday-Sunday   Your e-visit answers were reviewed by a board certified advanced clinical practitioner to complete your personal care plan.  Thank you for using e-Visits. 

## 2017-12-20 DIAGNOSIS — Z01419 Encounter for gynecological examination (general) (routine) without abnormal findings: Secondary | ICD-10-CM | POA: Diagnosis not present

## 2017-12-20 DIAGNOSIS — Z124 Encounter for screening for malignant neoplasm of cervix: Secondary | ICD-10-CM | POA: Diagnosis not present

## 2017-12-20 MED FILL — NORETHIN-ESTRAD-FERR 1-0.02: 1-20 | 84 days supply | Qty: 84 | Fill #0

## 2018-01-16 ENCOUNTER — Ambulatory Visit: Payer: 59 | Admitting: Family Medicine

## 2018-01-17 ENCOUNTER — Ambulatory Visit (INDEPENDENT_AMBULATORY_CARE_PROVIDER_SITE_OTHER): Payer: 59 | Admitting: Family Medicine

## 2018-01-17 ENCOUNTER — Encounter: Payer: Self-pay | Admitting: Family Medicine

## 2018-01-17 VITALS — BP 110/76 | HR 99 | Ht 63.0 in | Wt 184.8 lb

## 2018-01-17 DIAGNOSIS — R41840 Attention and concentration deficit: Secondary | ICD-10-CM | POA: Diagnosis not present

## 2018-01-17 DIAGNOSIS — Z23 Encounter for immunization: Secondary | ICD-10-CM | POA: Diagnosis not present

## 2018-01-17 DIAGNOSIS — H7291 Unspecified perforation of tympanic membrane, right ear: Secondary | ICD-10-CM

## 2018-01-17 DIAGNOSIS — G479 Sleep disorder, unspecified: Secondary | ICD-10-CM | POA: Diagnosis not present

## 2018-01-17 MED ORDER — BUPROPION HCL ER (XL) 150 MG PO TB24: 150.0000 mg | ORAL_TABLET | ORAL | 1 refills | Status: DC

## 2018-01-17 MED ORDER — AMPHETAMINE-DEXTROAMPHET ER 10 MG PO CP24: 10.0000 mg | ORAL_CAPSULE | Freq: Every day | ORAL | 0 refills | Status: DC

## 2018-01-17 MED FILL — buPROPion HCL ER (XL) 150 M: 150 | 90 days supply | Qty: 90 | Fill #0

## 2018-01-17 NOTE — Patient Instructions (Signed)
For more information you can go to:  [email or web address].[email or web address]-[email or web address]/  [email or web address].chadd.org/   If you have insomnia or difficulty sleeping, this information is for you:  - Avoid caffeinated beverages after lunch,  no alcoholic beverages,  no eating within 2-3 hours of lying down,  avoid exposure to blue light before bed,  avoid daytime naps, and  needs to maintain a regular sleep schedule- go to sleep and wake up around the same time every night.   - Resolve concerns or worries before entering bedroom:  Discussed relaxation techniques with patient and to keep a journal to write down fears\ worries.  I suggested seeing a counselor for CBT.   - Recommend patient meditate or do deep breathing exercises to help relax.   Incorporate the use of white noise machines or listen to "sleep meditation music", or recordings of guided meditations for sleep from YouTube which are free, such as  "guided meditation for detachment from over thinking"  by Ina KickMichael Sealey.

## 2018-01-17 NOTE — Progress Notes (Signed)
ADHD/ ADD OV note   Impression and Recommendations:    1. Attention and concentration deficit   2. Sleeping difficulties   3. Tympanic membrane rupture, right   4. Need for Tdap vaccination     1. Attention Deficit Management - Start 150 mg Wellbutrin in the morning along with 10 mg Adderall. - If patient experiences no change on the Wellbutrin, she knows to eventually discontinue.  Return in 3 months for re-evaluation.  - If patient has issues with Wellbutrin, she knows to let us know.  - Sx managed well with Adderall, but not throughout the entire workday.  - Dicussed that in the future, if patient desires, she may start taking one 10 mg Adderall in the morning, one 10 mg in the afternoon.    - Medication profile and side effects discussed for the following: Stimulants in general, Wellbutrin.  - Discussed diet, including magnesium, fish oil, and avoiding carbohydrate loading.  Importance of daily exercise stressed, along with critical importance of prudent sleep hygiene.  2. Sleep Habits - Educated patient on restoring sleep-wake cycle and obtaining adequate sleep to improve attention and mood.  - Handout on sleep provided.  - Patient knows that she may incorporate melatonin or sleep meditation as recommended for assistance with her sleep habits.  Counseled patient at length.  3. Sinus Concerns & Ear Care - Advised the patient to begin using AYR or Neilmed sinus rinses BID followed by flonase BID (one spray to each nostril).  Advised that the patient may also incorporate allegra or claritin PRN.   - Patient may also use a mixture of half hydrogen peroxide half rubbing alcohol to clean the ears PRN.  Educated patient about proper technique and prudent ear care.  4. BMI Counseling Explained to patient what BMI refers to, and what it means medically.    Told patient to think about it as a "medical risk stratification measurement" and how increasing BMI is associated with  increasing risk/ or worsening state of various diseases such as hypertension, hyperlipidemia, diabetes, premature OA, depression etc.  American Heart Association guidelines for healthy diet, basically Mediterranean diet, and exercise guidelines of 30 minutes 5 days per week or more discussed in detail.  Health counseling performed.  All questions answered.  5. Lifestyle & Preventative Health Maintenance - Advised patient to continue working toward exercising to improve overall mental, physical, and emotional health.    - Encouraged patient to engage in daily physical activity, especially a formal exercise routine.  Recommended that the patient eventually strive for at least 150 minutes of moderate cardiovascular activity per week according to guidelines established by the [facility].   - Healthy dietary habits encouraged, including low-carb, and high amounts of lean protein in diet.   - Patient should also consume adequate amounts of water - half of body weight in oz of water per day.   Education and routine counseling performed. Handouts provided if pt desired.  6. Follow-Up - Continue to return for regularly scheduled chronic follow-up.  - Patient knows that she may return to the clinic for acute concerns PRN.    Orders Placed This Encounter  Procedures  . Tdap vaccine greater than or equal to 7yo IM     Return for 3mo after adding wellbutrin to regimen, prn melatonin sleep.   -Reminded patient the need for yearly complete physical exam office visits in addition to office visits for management of the chronic diseases  -Gross side effects, risk and benefits,  and alternatives of medications discussed with patient.  Patient is aware that all medications have potential side effects and we are unable to predict every side effect or drug-drug interaction that may occur.  Expresses verbal understanding and consents to current therapy plan and treatment regimen.   Please see AVS handed out  to patient at the end of our visit for further patient instructions/ counseling done pertaining to today's office visit.    Note: This document was prepared using Dragon voice recognition software and may include unintentional dictation errors.  This document serves as a record of services personally performed by Thomasene Lot, DO. It was created on her behalf by [name], a trained medical scribe. The creation of this record is based on the scribe's personal observations and the provider's statements to them.   I have reviewed the above medical documentation for accuracy and completeness and I concur.  Thomasene Lot 01/17/18 9:38 AM    ___________________________________________________________    Subjective:  HPI: Stefanie White y.o. female  presents for 3 month follow up for multiple medical problems.  Patient has been doing well overall.  Attention Deficit - Adderall Treatment Notes she tried going up to 20 mg per day, for a week, but started having weird symptoms; she was still eating, but getting some stomachaches and shaking, and discontinued the increased dose.  Patient's appetite is intact.  Notes that the 10 "kind of gets her through the day," but she starts losing focus after lunch.  She eats lunch around 3-4 PM, and after that, she still has about four and a half hours of work to do.  She's thinking of taking one in the morning and one around 12-1 PM, but would rather try adding Wellbutrin to her treatment.  Sleep  Having trouble staying asleep; isn't sure if it's because she's keeping weird hours.  Normally goes to bed at 10 or 11, but lately staying up to 12 or 1 AM.  Exercise Habits Patient tries to sneak away to the gym for 10 minutes, three times daily at work.  Is trying to do "10 minute bursts of exercise."  She is also trying to motivate herself to go for a walk with her boyfriend when she gets home.  Right Ear - Per pt, Ruptured  Eardrum Patient notes that she recently ruptured her eardrum with a Q-tip.   Problem  Sleeping Difficulties      Weight:  Wt Readings from Last 3 Encounters:  01/17/18 184 lb 12.8 oz (83.8 kg)  10/11/17 184 lb 4.8 oz (83.6 kg)  10/03/17 185 lb (83.9 kg)   BMI Readings from Last 3 Encounters:  01/17/18 32.74 kg/m  10/11/17 32.65 kg/m  10/03/17 32.77 kg/m   BP Readings from Last 3 Encounters:  01/17/18 110/76  10/11/17 127/77  10/03/17 124/83     Review of Systems: General:   No F/C, wt loss Pulm:   No DIB, SOB, pleuritic chest pain Card:  No CP, palpitations Abd:  No n/v/d or pain Ext:  No inc edema from baseline   Objective: Physical Exam: BP 110/76   Pulse 99   Ht 5\' 3"  (1.6 m)   Wt 184 lb 12.8 oz (83.8 kg)   LMP 01/03/2018 (Approximate)   SpO2 98%   BMI 32.74 kg/m  Body mass index is 32.74 kg/m. General: Well nourished, in no apparent distress. Eyes: PERRLA, EOMs, conjunctiva clr no swelling or erythema Neck: supple Resp: Respiratory effort- normal, ECTA B/L w/o W/R/R  Cardio:  RRR w/o MRGs. Skin: Warm, dry without rashes, lesions, ecchymosis.  Neuro: Alert, Oriented Psych: Normal affect, Insight and Judgment appropriate.  Right Ear: Tympanic membrane showed one area of fibrous tissue covering approximately 1/3 of TM, consistent with scar from healed perforation.  Scant amount of dried blood in EAC.  Otherwise normal.   Current Medications:  Current Outpatient Medications on File Prior to Visit  Medication Sig Dispense Refill  . aspirin-acetaminophen-caffeine (EXCEDRIN MIGRAINE) 250-250-65 MG tablet Take 1-2 tablets by mouth every 6 (six) hours as needed for headache.    . Cholecalciferol (VITAMIN D) 2000 units CAPS Take 1 capsule by mouth daily.    . fexofenadine-pseudoephedrine (ALLEGRA-D ALLERGY & CONGESTION) 180-240 MG 24 hr tablet Take 1 tablet by mouth daily. 90 tablet 1  . fluticasone (FLONASE) 50 MCG/ACT nasal spray Place 1 spray into both  nostrils 2 (two) times daily. 16 g 6  . Multiple Vitamin (MULTIVITAMIN) capsule Take 1 capsule by mouth daily.    . Norethindrone Acet-Ethinyl Est (LARIN 1/20 PO) Take 1 tablet by mouth daily.     No current facility-administered medications on file prior to visit.     Medical History:  Patient Active Problem List   Diagnosis Date Noted  . Sleeping difficulties 01/17/2018  . Contact dermatitis and eczema-bilateral axilla 10/11/2017  . Attention and concentration deficit 08/29/2017  . Carbuncle and furuncle of leg 03/08/2017  . Vitamin D insufficiency 05/22/2016  . Obesity, Class I, BMI 30-34.9 05/22/2016  . Low serum HDL 05/22/2016  . Acute left otitis media 03/03/2016  . Otitis, externa, infective 03/03/2016  . Pharyngitis 03/03/2016  . Fever, unspecified 03/03/2016  . Migraine headache 03/01/2016  . History of ADHD 03/01/2016  . Environmental and seasonal allergies 03/01/2016  . Viral URI 03/01/2016  . h/o Alopecia areata 05/22/2014    Allergies:  Allergies  Allergen Reactions  . Rizatriptan Swelling    Mouth swelling     Family history-  Reviewed; changed as appropriate  Social history-  Reviewed; changed as appropriate

## 2018-02-01 MED FILL — ADDERALL XR 10 MG CAP SA: 10 | 90 days supply | Qty: 90 | Fill #0

## 2018-03-26 ENCOUNTER — Encounter: Payer: Self-pay | Admitting: Family Medicine

## 2018-03-26 ENCOUNTER — Ambulatory Visit (INDEPENDENT_AMBULATORY_CARE_PROVIDER_SITE_OTHER): Payer: 59 | Admitting: Family Medicine

## 2018-03-26 VITALS — BP 120/84 | HR 78 | Ht 63.0 in | Wt 183.2 lb

## 2018-03-26 DIAGNOSIS — R112 Nausea with vomiting, unspecified: Secondary | ICD-10-CM

## 2018-03-26 DIAGNOSIS — R42 Dizziness and giddiness: Secondary | ICD-10-CM | POA: Diagnosis not present

## 2018-03-26 DIAGNOSIS — N946 Dysmenorrhea, unspecified: Secondary | ICD-10-CM | POA: Diagnosis not present

## 2018-03-26 LAB — POCT URINALYSIS DIPSTICK
Bilirubin, UA: NEGATIVE
GLUCOSE UA: NEGATIVE
Ketones, UA: NEGATIVE
LEUKOCYTES UA: NEGATIVE
Nitrite, UA: NEGATIVE
Protein, UA: NEGATIVE
Spec Grav, UA: 1.02 (ref 1.010–1.025)
Urobilinogen, UA: 0.2 E.U./dL
pH, UA: 6 (ref 5.0–8.0)

## 2018-03-26 LAB — POCT URINE PREGNANCY: PREG TEST UR: NEGATIVE

## 2018-03-26 MED ORDER — ONDANSETRON HCL 4 MG PO TABS: 4.0000 mg | ORAL_TABLET | Freq: Three times a day (TID) | ORAL | 0 refills | Status: DC | PRN

## 2018-03-26 MED FILL — NORETHIN-ESTRAD-FERR 1-0.02: 1-20 | 84 days supply | Qty: 84 | Fill #1

## 2018-03-26 MED FILL — ONDANSETRON HCL 4 MG TABLET: 4 | 7 days supply | Qty: 20 | Fill #0

## 2018-03-26 NOTE — Patient Instructions (Signed)
Please get in touch with your GYN at [facility]. Tenny Craw about going on something besides oral birth control pills since it is hard for you to remember to always take them.  Also please follow the brat diet as we discussed.  Avoid all dairy products for at least 3 days after you are completely feeling back to normal and there is been no nausea or ill feelings.  Please avoid spicy foods, fried foods as well.    If something changes and you have increasing symptoms etc., I recommend you to follow-up with Dr. Tenny Craw for a transvaginal ultrasound to assess your left lower abdominal pain that you only occasionally have and also follow-up with [place] as needed- if Worse/ no improvement

## 2018-03-26 NOTE — Progress Notes (Signed)
Pt here for an acute care OV today   Impression and Recommendations:    1. Non-intractable vomiting with nausea, unspecified vomiting type   2. Dizziness- positional   3. Dysmenorrhea     1. Non-intractable vomiting with nausea, unspecified vomiting type - Blood work ordered today for evaluation. - Urinary pregnancy test ordered today. - Blood test to assess for pregnancy ordered today.  - Encouraged bland diet while symptoms resolve. - Eat bland foods, stay away from fried foods, fatty foods, spicy foods, and all dairy. - Anti-nausea medication prescribed today.  See med list.  - Patient knows to let us know if symptoms worsen or change.  Dysmenorrhea - Encouraged patient to take her birth control pill regularly as prescribed. - Reviewed that patient would possibly be much better candidate for an implantable device or an IUD. - Discussed the importance of the patient addressing birth control concerns with her OBGYN.  - STRONGLY encouraged patient to follow-up with OBGYN.   Meds ordered this encounter  Medications  . ondansetron (ZOFRAN) 4 MG tablet    Sig: Take 1 tablet (4 mg total) by mouth every 8 (eight) hours as needed for nausea or vomiting.    Dispense:  20 tablet    Refill:  0    Orders Placed This Encounter  Procedures  . CBC with Differential/Platelet  . Gamma GT  . Comprehensive metabolic panel  . Lipase  . Bilirubin, fractionated(tot/dir/indir)  . hCG, serum, qualitative  . POCT urinalysis dipstick  . POCT urine pregnancy    Meds ordered this encounter  Medications  . ondansetron (ZOFRAN) 4 MG tablet    Sig: Take 1 tablet (4 mg total) by mouth every 8 (eight) hours as needed for nausea or vomiting.    Dispense:  20 tablet    Refill:  0    Education and routine counseling performed. Handouts provided  Gross side effects, risk and benefits, and alternatives of medications and treatment plan in general discussed with patient.  Patient is aware  that all medications have potential side effects and we are unable to predict every side effect or drug-drug interaction that may occur.   Patient will call with any questions prior to using medication if they have concerns.    Expresses verbal understanding and consents to current therapy and treatment regimen.  No barriers to understanding were identified.  Red flag symptoms and signs discussed in detail.  Patient expressed understanding regarding what to do in case of emergency\urgent symptoms   Please see AVS handed out to patient at the end of our visit for further patient instructions/ counseling done pertaining to today's office visit.   Return for Follow-up for chronic office visits as previously discussed. Uneed blood work-fasting in future .     Note:  This document was prepared occasionally using Dragon voice recognition software and may include unintentional dictation errors in addition to a scribe.  This document serves as a record of services personally performed by Thomasene Lot, DO. It was created on her behalf by [name], a trained medical scribe. The creation of this record is based on the scribe's personal observations and the provider's statements to them.   I have reviewed the above medical documentation for accuracy and completeness and I concur.  Thomasene Lot 03/26/18 4:57 PM    ------------------------------------------------------------------------------------------------------------------------------------------    Subjective:    CC:  Chief Complaint  Patient presents with  . Nausea    vomiting at times  . Dizziness  HPI: Stefanie White is a 27 y.o. female who presents to [facility] today for issues as discussed below.  Intermittent Nausea Symptoms started 3 weeks ago.  Patient has been experiencing nauseous spells, lightheadedness, dizziness.  Sometimes experiencing sharp pains in her left lower  abdominal quadrant.  States she puts pressure on this area and the pain goes away in 10-15 minutes.  This started around the same time the other symptoms started.  Notes that she "starts her days fine, and then it hits her like a wave later on."  Nausea seems to get worse in the afternoon.  States "lunchtime and after."  Has vomited twice this past week from feeling so nauseous.  Skipped lunch last week.  Feels that her symptoms are getting worse because Saturday (3 days ago) she got up, and felt so dizzy that she had to sit right back down.  Denies fever or chills, denies head cold symptoms.  No sinus symptoms aside from usual.   Dizziness is sometimes worse if she's moving fast at work; turning her head quickly will provoke it.  Yesterday was able to eat dinner after taking it easy.  Has been eating her normal diet but "throwing in more salads to try to lose weight."  She has been able to eat and drink regularly the last three weeks.  Has never had these symptoms before.  Has been having unprotected sex.  Denies urinary symptoms or vaginal discharge.  Birth Control Method & Periods Patient has been having sex, unprotected aside from the birth control pill.  Experienced spotting and not a "regular period" last month.  Notes that since she started back on the pill her period has been more regular in general.  Notes that she started her period again yesterday.  Notes she's "not the greatest at taking the pill."  States "I have gone three days without taking it before, that's not uncommon for me."  Tries to make sure that if she skips a day taking the pill, she takes the two the next day.   No problems updated.   Wt Readings from Last 3 Encounters:  03/26/18 183 lb 3.2 oz (83.1 kg)  01/17/18 184 lb 12.8 oz (83.8 kg)  10/11/17 184 lb 4.8 oz (83.6 kg)   BP Readings from Last 3 Encounters:  03/26/18 120/84  01/17/18 110/76  10/11/17 127/77   BMI Readings from Last 3 Encounters:    03/26/18 32.45 kg/m  01/17/18 32.74 kg/m  10/11/17 32.65 kg/m     Patient Care Team    Relationship Specialty Notifications Start End  Thomasene Lot, DO PCP - General Family Medicine  03/01/16   Waynard Reeds, MD Consulting Physician Obstetrics and Gynecology  03/01/16   Glenford Peers, OD Referring Physician Optometry  03/01/16      Patient Active Problem List   Diagnosis Date Noted  . Sleeping difficulties 01/17/2018  . Contact dermatitis and eczema-bilateral axilla 10/11/2017  . Attention and concentration deficit 08/29/2017  . Carbuncle and furuncle of leg 03/08/2017  . Vitamin D insufficiency 05/22/2016  . Obesity, Class I, BMI 30-34.9 05/22/2016  . Low serum HDL 05/22/2016  . Acute left otitis media 03/03/2016  . Otitis, externa, infective 03/03/2016  . Pharyngitis 03/03/2016  . Fever, unspecified 03/03/2016  . Migraine headache 03/01/2016  . History of ADHD 03/01/2016  . Environmental and seasonal allergies 03/01/2016  . Viral URI 03/01/2016  . h/o Alopecia areata 05/22/2014      Past Medical  History:  Diagnosis Date  . Alopecia      History reviewed. No pertinent surgical history.   Family History  Problem Relation Age of Onset  . Diabetes Father 22  . Hypertension Father 35  . Depression Brother 10       bipolar  . Cancer Maternal Aunt 45       colon  . Heart attack Paternal Grandmother 76     Social History   Socioeconomic History  . Marital status: Single    Spouse name: Not on file  . Number of children: Not on file  . Years of education: Not on file  . Highest education level: Not on file  Occupational History  . Not on file  Social Needs  . Financial resource strain: Not on file  . Food insecurity:    Worry: Not on file    Inability: Not on file  . Transportation needs:    Medical: Not on file    Non-medical: Not on file  Tobacco Use  . Smoking status: Former Smoker    Packs/day: 0.25    Years: 1.00    Pack years: 0.25     Types: Cigarettes    Last attempt to quit: 06/12/2010    Years since quitting: 7.7  . Smokeless tobacco: Never Used  Substance and Sexual Activity  . Alcohol use: Yes    Alcohol/week: 1.0 standard drinks    Types: 1 Glasses of wine per week  . Drug use: No  . Sexual activity: Yes    Birth control/protection: Implant  Lifestyle  . Physical activity:    Days per week: Not on file    Minutes per session: Not on file  . Stress: Not on file  Relationships  . Social connections:    Talks on phone: Not on file    Gets together: Not on file    Attends religious service: Not on file    Active member of club or organization: Not on file    Attends meetings of clubs or organizations: Not on file    Relationship status: Not on file  . Intimate partner violence:    Fear of current or ex partner: Not on file    Emotionally abused: Not on file    Physically abused: Not on file    Forced sexual activity: Not on file  Other Topics Concern  . Not on file  Social History Narrative  . Not on file     Current Meds  Medication Sig  . amphetamine-dextroamphetamine (ADDERALL XR) 10 MG 24 hr capsule Take 1 capsule (10 mg total) by mouth daily.  Marland Kitchen aspirin-acetaminophen-caffeine (EXCEDRIN MIGRAINE) 250-250-65 MG tablet Take 1-2 tablets by mouth every 6 (six) hours as needed for headache.  Marland Kitchen buPROPion (WELLBUTRIN XL) 150 MG 24 hr tablet Take 1 tablet (150 mg total) by mouth every morning.  . Cholecalciferol (VITAMIN D) 2000 units CAPS Take 1 capsule by mouth daily.  . fexofenadine-pseudoephedrine (ALLEGRA-D ALLERGY & CONGESTION) 180-240 MG 24 hr tablet Take 1 tablet by mouth daily.  . fluticasone (FLONASE) 50 MCG/ACT nasal spray Place 1 spray into both nostrils 2 (two) times daily.  . Multiple Vitamin (MULTIVITAMIN) capsule Take 1 capsule by mouth daily.  . Norethindrone Acet-Ethinyl Est (LARIN 1/20 PO) Take 1 tablet by mouth daily.    Allergies:  Allergies  Allergen Reactions  . Rizatriptan  Swelling    Mouth swelling     Review of Systems: General:   Denies fever, chills, unexplained weight  loss.  Optho/Auditory:   Denies visual changes, blurred vision/LOV Respiratory:   Denies wheeze, DOE more than baseline levels.   Cardiovascular:   Denies chest pain, palpitations, new onset peripheral edema  Gastrointestinal:   Denies nausea, vomiting, diarrhea, abd pain.  Genitourinary: Denies dysuria, freq/ urgency, flank pain or discharge from genitals.  Endocrine:     Denies hot or cold intolerance, polyuria, polydipsia. Musculoskeletal:   Denies unexplained myalgias, joint swelling, unexplained arthralgias, gait problems.  Skin:  Denies new onset rash, suspicious lesions Neurological:     Denies dizziness, unexplained weakness, numbness  Psychiatric/Behavioral:   Denies mood changes, suicidal or homicidal ideations, hallucinations    Objective:   Blood pressure 120/84, pulse 78, height 5\' 3"  (1.6 m), weight 183 lb 3.2 oz (83.1 kg), last menstrual period 03/25/2018, SpO2 99 %. Body mass index is 32.45 kg/m. General:  Well Developed, well nourished, appropriate for stated age.  Neuro:  Alert and oriented,  extra-ocular muscles intact  HEENT:  Normocephalic, atraumatic, neck supple Skin:  no gross rash, warm, pink. Cardiac:  RRR, S1 S2 Respiratory:  ECTA B/L and A/P, Not using accessory muscles, speaking in full sentences- unlabored. Vascular:  Ext warm, no cyanosis apprec.; cap RF less 2 sec. Psych:  No HI/SI, judgement and insight good, Euthymic mood. Full Affect. Abdominal: Soft, non tender, positive bowel sounds *4, no G/R/R, no OM.  No suprapubic tenderness.

## 2018-03-27 LAB — COMPREHENSIVE METABOLIC PANEL
ALBUMIN: 4.4 g/dL (ref 3.5–5.5)
ALT: 27 IU/L (ref 0–32)
AST: 25 IU/L (ref 0–40)
Albumin/Globulin Ratio: 1.6 (ref 1.2–2.2)
Alkaline Phosphatase: 78 IU/L (ref 39–117)
BUN / CREAT RATIO: 13 (ref 9–23)
BUN: 9 mg/dL (ref 6–20)
Bilirubin Total: 0.2 mg/dL (ref 0.0–1.2)
CALCIUM: 9.3 mg/dL (ref 8.7–10.2)
CO2: 16 mmol/L — ABNORMAL LOW (ref 20–29)
CREATININE: 0.71 mg/dL (ref 0.57–1.00)
Chloride: 105 mmol/L (ref 96–106)
GFR calc non Af Amer: 118 mL/min/{1.73_m2} (ref >59)
GFR, EST AFRICAN AMERICAN: 136 mL/min/{1.73_m2} (ref >59)
GLUCOSE: 103 mg/dL — AB (ref 65–99)
Globulin, Total: 2.8 g/dL (ref 1.5–4.5)
Potassium: 4.2 mmol/L (ref 3.5–5.2)
Sodium: 141 mmol/L (ref 134–144)
TOTAL PROTEIN: 7.2 g/dL (ref 6.0–8.5)

## 2018-03-27 LAB — CBC WITH DIFFERENTIAL/PLATELET
Basophils Absolute: 0.1 10*3/uL (ref 0.0–0.2)
Basos: 1 %
EOS (ABSOLUTE): 0.2 10*3/uL (ref 0.0–0.4)
EOS: 3 %
HEMATOCRIT: 33.6 % — AB (ref 34.0–46.6)
HEMOGLOBIN: 11.3 g/dL (ref 11.1–15.9)
IMMATURE GRANS (ABS): 0 10*3/uL (ref 0.0–0.1)
IMMATURE GRANULOCYTES: 0 %
LYMPHS ABS: 1.5 10*3/uL (ref 0.7–3.1)
LYMPHS: 21 %
MCH: 29.7 pg (ref 26.6–33.0)
MCHC: 33.6 g/dL (ref 31.5–35.7)
MCV: 88 fL (ref 79–97)
MONOCYTES: 8 %
Monocytes Absolute: 0.6 10*3/uL (ref 0.1–0.9)
NEUTROS PCT: 67 %
Neutrophils Absolute: 5.1 10*3/uL (ref 1.4–7.0)
Platelets: 333 10*3/uL (ref 150–450)
RBC: 3.81 x10E6/uL (ref 3.77–5.28)
RDW: 12.1 % — ABNORMAL LOW (ref 12.3–15.4)
WBC: 7.5 10*3/uL (ref 3.4–10.8)

## 2018-03-27 LAB — GAMMA GT: GGT: 17 IU/L (ref 0–60)

## 2018-03-27 LAB — HCG, SERUM, QUALITATIVE: hCG,Beta Subunit,Qual,Serum: NEGATIVE m[IU]/mL (ref <6)

## 2018-03-27 LAB — LIPASE: Lipase: 37 U/L (ref 14–72)

## 2018-03-29 ENCOUNTER — Ambulatory Visit: Payer: 59 | Admitting: Family Medicine

## 2018-04-25 ENCOUNTER — Ambulatory Visit: Payer: 59 | Admitting: Family Medicine

## 2018-04-30 ENCOUNTER — Encounter: Payer: Self-pay | Admitting: Family Medicine

## 2018-04-30 ENCOUNTER — Ambulatory Visit: Payer: Self-pay | Admitting: Family Medicine

## 2018-04-30 VITALS — BP 122/82 | HR 113 | Temp 97.7°F | Wt 183.0 lb

## 2018-04-30 DIAGNOSIS — R0981 Nasal congestion: Secondary | ICD-10-CM

## 2018-04-30 DIAGNOSIS — J329 Chronic sinusitis, unspecified: Secondary | ICD-10-CM

## 2018-04-30 DIAGNOSIS — H6690 Otitis media, unspecified, unspecified ear: Secondary | ICD-10-CM

## 2018-04-30 MED ORDER — AMOXICILLIN-POT CLAVULANATE 875-125 MG PO TABS: 1.0000 | ORAL_TABLET | Freq: Two times a day (BID) | ORAL | 0 refills | Status: AC

## 2018-04-30 MED ORDER — AZELASTINE HCL 0.1 % NA SOLN: 1.0000 | Freq: Two times a day (BID) | NASAL | 0 refills | Status: DC

## 2018-04-30 MED ORDER — PSEUDOEPH-BROMPHEN-DM 30-2-10 MG/5ML PO SYRP: 10.0000 mL | ORAL_SOLUTION | Freq: Three times a day (TID) | ORAL | 0 refills | Status: DC | PRN

## 2018-04-30 NOTE — Progress Notes (Signed)
Stefanie GuntherJessica R White is a 27 y.o. female who presents today with concerns of ear pain and sinus congestion symptoms. She reports tympanic membrane rupture in the last 3 months.   Review of Systems  Constitutional: Negative for chills, fever and malaise/fatigue.  HENT: Positive for congestion, ear pain and sinus pain. Negative for ear discharge and sore throat.   Eyes: Negative.   Respiratory: Positive for cough. Negative for sputum production and shortness of breath.   Cardiovascular: Negative.  Negative for chest pain.  Gastrointestinal: Negative for abdominal pain, diarrhea, nausea and vomiting.  Genitourinary: Negative for dysuria, frequency, hematuria and urgency.  Musculoskeletal: Negative for myalgias.  Skin: Negative.   Neurological: Positive for dizziness and headaches.  Endo/Heme/Allergies: Negative.   Psychiatric/Behavioral: Negative.     O: Vitals:   04/30/18 1816  BP: 122/82  Pulse: (!) 113  Temp: 97.7 F (36.5 C)  SpO2: 94%     Physical Exam  Constitutional: She is oriented to person, place, and time. Vital signs are normal. She appears well-developed and well-nourished. She is active.  Non-toxic appearance. She does not have a sickly appearance.  HENT:  Head: Normocephalic.  Right Ear: Hearing, tympanic membrane, external ear and ear canal normal.  Left Ear: Hearing, external ear and ear canal normal. Tympanic membrane is injected and erythematous.  Nose: Rhinorrhea present. Right sinus exhibits frontal sinus tenderness. Right sinus exhibits no maxillary sinus tenderness. Left sinus exhibits frontal sinus tenderness. Left sinus exhibits no maxillary sinus tenderness.  Mouth/Throat: Uvula is midline and oropharynx is clear and moist.  Neck: Normal range of motion. Neck supple.  Cardiovascular: Normal rate, regular rhythm, normal heart sounds and normal pulses.  Pulmonary/Chest: Effort normal and breath sounds normal.  Abdominal: Soft. Bowel sounds are normal.   Musculoskeletal: Normal range of motion.  Lymphadenopathy:       Head (right side): No submental, no submandibular and no tonsillar adenopathy present.       Head (left side): No submental, no submandibular and no tonsillar adenopathy present.    She has cervical adenopathy.       Right cervical: Superficial cervical adenopathy present.       Left cervical: Superficial cervical adenopathy present.  Neurological: She is alert and oriented to person, place, and time.  Psychiatric: She has a normal mood and affect.  Vitals reviewed.  A: 1. Sinusitis, unspecified chronicity, unspecified location   2. Nasal congestion    P: Discussed exam findings, diagnosis etiology and medication use and indications reviewed with patient. Follow- Up and discharge instructions provided. No emergent/urgent issues found on exam.  Patient verbalized understanding of information provided and agrees with plan of care (POC), all questions answered.  1. Sinusitis, unspecified chronicity, unspecified location - amoxicillin-clavulanate (AUGMENTIN) 875-125 MG tablet; Take 1 tablet by mouth 2 (two) times daily for 7 days. - brompheniramine-pseudoephedrine-DM 30-2-10 MG/5ML syrup; Take 10 mLs by mouth 3 (three) times daily as needed.  2. Nasal congestion - brompheniramine-pseudoephedrine-DM 30-2-10 MG/5ML syrup; Take 10 mLs by mouth 3 (three) times daily as needed. - azelastine (ASTELIN) 0.1 % nasal spray; Place 1 spray into both nostrils 2 (two) times daily. Use in each nostril as directed

## 2018-04-30 NOTE — Patient Instructions (Signed)

## 2018-05-01 MED FILL — BROMPHENIR-PSEUDOEPHED-DM S: 30-2-10 | 4 days supply | Qty: 120 | Fill #0

## 2018-05-01 MED FILL — AZELASTINE HCL 137 MCG/SPRA: 137 | 50 days supply | Qty: 30 | Fill #0

## 2018-05-01 MED FILL — AMOX-CLAV 875-125 MG TABLET: 875-125 | 7 days supply | Qty: 14 | Fill #0

## 2018-05-02 ENCOUNTER — Telehealth: Payer: Self-pay

## 2018-05-02 NOTE — Telephone Encounter (Signed)
Patient states she is doing better, she is drinking a lot of fluids and following the plan by the provider.

## 2018-06-07 ENCOUNTER — Ambulatory Visit (INDEPENDENT_AMBULATORY_CARE_PROVIDER_SITE_OTHER): Payer: 59 | Admitting: Family Medicine

## 2018-06-07 ENCOUNTER — Encounter: Payer: Self-pay | Admitting: Family Medicine

## 2018-06-07 VITALS — BP 117/83 | HR 104 | Temp 97.8°F | Ht 63.0 in | Wt 188.0 lb

## 2018-06-07 DIAGNOSIS — R519 Headache, unspecified: Secondary | ICD-10-CM

## 2018-06-07 DIAGNOSIS — R5381 Other malaise: Secondary | ICD-10-CM | POA: Diagnosis not present

## 2018-06-07 DIAGNOSIS — E669 Obesity, unspecified: Secondary | ICD-10-CM | POA: Diagnosis not present

## 2018-06-07 DIAGNOSIS — Z713 Dietary counseling and surveillance: Secondary | ICD-10-CM | POA: Diagnosis not present

## 2018-06-07 DIAGNOSIS — R41840 Attention and concentration deficit: Secondary | ICD-10-CM

## 2018-06-07 DIAGNOSIS — R51 Headache: Secondary | ICD-10-CM | POA: Diagnosis not present

## 2018-06-07 DIAGNOSIS — Z7182 Exercise counseling: Secondary | ICD-10-CM | POA: Diagnosis not present

## 2018-06-07 DIAGNOSIS — J3089 Other allergic rhinitis: Secondary | ICD-10-CM

## 2018-06-07 DIAGNOSIS — R000 Tachycardia, unspecified: Secondary | ICD-10-CM | POA: Diagnosis not present

## 2018-06-07 MED ORDER — AMPHETAMINE-DEXTROAMPHET ER 10 MG PO CP24: 10.0000 mg | ORAL_CAPSULE | Freq: Every day | ORAL | 0 refills | Status: DC

## 2018-06-07 MED FILL — ADDERALL XR 10 MG CAP SA: 10 | 90 days supply | Qty: 90 | Fill #0

## 2018-06-07 NOTE — Progress Notes (Signed)
ADHD/ ADD OV note   Impression and Recommendations:    1. Attention and concentration deficit   2. Obesity, Class I, BMI 30-34.9   3. Dietary counseling   4. Physical deconditioning   5. Exercise counseling   6. Tachycardia   7. Environmental and seasonal allergies   8. Sinus headache     - Patient recently discontinued Wellbutrin on her own- did not know.  Attention & Concentration Deficit Management - Stable at this time. - Continue treatment plan as recommended. - Patient tolerating medications well.  Denies S-E.  - Educated patient about incorporating a 5-10 instant-release in the afternoons, if needed in the future.  - Discussed that eating better and exercising will help with her ADD tremendously.  Pulse of 104 on Intake - Discussed causes of elevated pulse with patient in office today.  Reviewed that patient's lack of physical conditioning is likely the cause of her heightened pulse, as well as management on Adderall.  - During discussion, patient agrees and confirms that she is out of shape, describing an incident where she ran up two flights of stairs and felt she was "almost going to pass out."  Will continue to monitor.  BMI Counseling - BMI of 33.30 Explained to patient what BMI refers to, and what it means medically.    Told patient to think about it as a "medical risk stratification measurement" and how increasing BMI is associated with increasing risk/ or worsening state of various diseases such as hypertension, hyperlipidemia, diabetes, premature OA, depression etc.  American Heart Association guidelines for healthy diet, basically Mediterranean diet, and exercise guidelines of 30 minutes 5 days per week or more discussed in detail.  Health counseling performed.  All questions answered.  Lifestyle & Preventative Health Maintenance - Advised patient to continue working toward exercising to improve overall mental, physical, and emotional health.    -  Reviewed the "spokes of the wheel" of mood and health management.  Stressed the importance of ongoing prudent habits, including regular exercise, appropriate sleep hygiene, healthful dietary habits, and prayer/meditation to relax.  - Encouraged patient to engage in daily physical activity, especially a formal exercise routine.  Recommended that the patient eventually strive for at least 150 minutes of moderate cardiovascular activity per week according to guidelines established by the [facility].   - Healthy dietary habits encouraged, including low-carb, and high amounts of lean protein in diet.   - Patient should also consume adequate amounts of water.   Education and routine counseling performed. Handouts provided if pt desired.   Medications Discontinued During This Encounter  Medication Reason  . buPROPion (WELLBUTRIN XL) 150 MG 24 hr tablet Patient Preference  . brompheniramine-pseudoephedrine-DM 30-2-10 MG/5ML syrup Patient Preference  . azelastine (ASTELIN) 0.1 % nasal spray   . amphetamine-dextroamphetamine (ADDERALL XR) 10 MG 24 hr capsule Reorder     Return in about 4 weeks (around 07/05/2018) for wt loss- goal 8 lbs loss.   -Reminded patient the need for yearly complete physical exam office visits in addition to office visits for management of the chronic diseases  -Gross side effects, risk and benefits, and alternatives of medications discussed with patient.  Patient is aware that all medications have potential side effects and we are unable to predict every side effect or drug-drug interaction that may occur.  Expresses verbal understanding and consents to current therapy plan and treatment regimen.   Please see AVS handed out to patient at the end of our  visit for further patient instructions/ counseling done pertaining to today's office visit.    Note:  This document was prepared using Dragon voice recognition software and may include unintentional dictation errors.  This  document serves as a record of services personally performed by Stefanie Loteborah Yareliz Thorstenson, DO. It was created on her behalf by Peggye FothergillKatherine Galloway, a trained medical scribe. The creation of this record is based on the scribe's personal observations and the provider's statements to them.   I have reviewed the above medical documentation for accuracy and completeness and I concur.  Stefanie Loteborah Chrles Selley, DO 06/09/2018 9:49 PM      ______________________________________________________________________    Subjective:  HPI: Stefanie Stefanie R [id number] y.o. female  presents for 3 month follow up for evaluation of our treatment plan for pt's ADD/ ADHD.  Last visit was 01/27/2018.   Discontinued Wellbutrin after Nausea Sx States she stopped taking Wellbutrin back when she was experiencing nausea and vomiting.  After she discontinued the Wellbutrin, she feels that her nausea/dizziness resolved.  States she doesn't think she needs to take Wellbutrin to assist with her adhd/ sleep/mood.  Patient hasn't had to take the Zofran in two months.   Seasonal Allergies & Headaches Takes Flonase and allegra most days, and Excedrin migraine as needed.  Had a headache last week.  "Normally if I feel the headache coming on, I know to reach for the Excedrin so it never gets bad."  Attention Deficit Management Takes one capsule of Adderall per day and feels that it works well.  Feels that "20 was way too much," and that the 10 works fine.  "If I cut out all of my sugars, it would probably be better."    When diet is better- her energy and focus is better  Obese:   Has weight loss and health goals moving forward.  She got a nutrabullet for Christmas and has a plan to do healthy shakes, cut out caffeinated drinks, and eat more fruits & veggies.  "My goal is not to be this weight when I get married."  She's getting married in March.  Elevated HR Notes that her pulse has been at the 100's lately, which has taken her a bit by surprise.  She thinks it is likely poor conditioning- as she has been less active lately.  Also, pt has no sx at all- denies palpitations, CP, etc     Wt Readings from Last 3 Encounters:  06/07/18 188 lb (85.3 kg)  04/30/18 183 lb (83 kg)  03/26/18 183 lb 3.2 oz (83.1 kg)    BMI Readings from Last 3 Encounters:  06/07/18 33.30 kg/m  04/30/18 32.42 kg/m  03/26/18 32.45 kg/m    BP Readings from Last 3 Encounters:  06/07/18 117/83  04/30/18 122/82  03/26/18 120/84     Review of Systems: General:   No F/C, wt loss Pulm:   No DIB, SOB, pleuritic chest pain Card:  No CP, palpitations Abd:  No n/v/d or pain Ext:  No inc edema from baseline   Objective: Physical Exam: BP 117/83   Pulse (!) 104   Temp 97.8 F (36.6 C)   Ht 5\' 3"  (1.6 m)   Wt 188 lb (85.3 kg)   SpO2 100%   BMI 33.30 kg/m  Body mass index is 33.3 kg/m. General: Well nourished, in no apparent distress. Eyes: PERRLA, EOMs, conjunctiva clr no swelling or erythema Neck: supple Resp: Respiratory effort- normal, ECTA B/L w/o W/R/R  Cardio: RRR w/o MRGs. Skin: Warm, dry without  rashes, lesions, ecchymosis.  Neuro: Alert, Oriented Psych: Normal affect, Insight and Judgment appropriate.    Current Medications:  Current Outpatient Medications on File Prior to Visit  Medication Sig Dispense Refill  . aspirin-acetaminophen-caffeine (EXCEDRIN MIGRAINE) 250-250-65 MG tablet Take 1-2 tablets by mouth every 6 (six) hours as needed for headache.    . Cholecalciferol (VITAMIN D) 2000 units CAPS Take 1 capsule by mouth daily.    . fexofenadine-pseudoephedrine (ALLEGRA-D ALLERGY & CONGESTION) 180-240 MG 24 hr tablet Take 1 tablet by mouth daily. 90 tablet 1  . fluticasone (FLONASE) 50 MCG/ACT nasal spray Place 1 spray into both nostrils 2 (two) times daily. 16 g 6  . Multiple Vitamin (MULTIVITAMIN) capsule Take 1 capsule by mouth daily.    . Norethindrone Acet-Ethinyl Est (LARIN 1/20 PO) Take 1 tablet by mouth daily.    .  ondansetron (ZOFRAN) 4 MG tablet Take 1 tablet (4 mg total) by mouth every 8 (eight) hours as needed for nausea or vomiting. 20 tablet 0   No current facility-administered medications on file prior to visit.     Medical History:  Patient Active Problem List   Diagnosis Date Noted  . Tachycardia 06/09/2018  . Sinus headache 06/09/2018  . Physical deconditioning 06/09/2018  . Dietary counseling 06/09/2018  . Exercise counseling 06/09/2018  . Sleeping difficulties 01/17/2018  . Contact dermatitis and eczema-bilateral axilla 10/11/2017  . Attention and concentration deficit 08/29/2017  . Carbuncle and furuncle of leg 03/08/2017  . Vitamin D insufficiency 05/22/2016  . Obesity, Class I, BMI 30-34.9 05/22/2016  . Low serum HDL 05/22/2016  . Acute left otitis media 03/03/2016  . Otitis, externa, infective 03/03/2016  . Pharyngitis 03/03/2016  . Fever, unspecified 03/03/2016  . Migraine headache 03/01/2016  . History of ADHD 03/01/2016  . Environmental and seasonal allergies 03/01/2016  . Viral URI 03/01/2016  . h/o Alopecia areata 05/22/2014    Allergies:  Allergies  Allergen Reactions  . Rizatriptan Swelling    Mouth swelling     Family history-  Reviewed; changed as appropriate  Social history-  Reviewed; changed as appropriate

## 2018-06-07 NOTE — Patient Instructions (Addendum)
Please download the LoseIt app to help track your nutrition.    Make sure to be vigilant with your food choices and track everything closely.  Try to reach 15,000 steps per day.  This is the reason for your elevated heart rate.   Behavior Modification Ideas for Weight Management  Weight management involves adopting a healthy lifestyle that includes a knowledge of nutrition and exercise, a positive attitude and the right kind of motivation. Internal motives such as better health, increased energy, self-esteem and personal control increase your chances of lifelong weight management success.  Remember to have realistic goals and think long-term success. Believe in yourself and you can do it. The following information will give you ideas to help you meet your goals.  Control Your Home Environment  Eat only while sitting down at the kitchen or dining room table. Do not eat while watching television, reading, cooking, talking on the phone, standing at the refrigerator or working on the computer. Keep tempting foods out of the house -- don't buy them. Keep tempting foods out of sight. Have low-calorie foods ready to eat. Unless you are preparing a meal, stay out of the kitchen. Have healthy snacks at your disposal, such as small pieces of fruit, vegetables, canned fruit, pretzels, low-fat string cheese and nonfat cottage cheese.  Control Your Work Environment  Do not eat at [employer] or keep tempting snacks at your desk. If you get hungry between meals, plan healthy snacks and bring them with you to work. During your breaks, go for a walk instead of eating. If you work around food, plan in advance the one item you will eat at mealtime. Make it inconvenient to nibble on food by chewing gum, sugarless candy or drinking water or another low-calorie beverage. Do not work through meals. Skipping meals slows down metabolism and may result in overeating at the next meal. If food is available for special  occasions, either pick the healthiest item, nibble on low-fat snacks brought from home, don't have anything offered, choose one option and have a small amount, or have only a beverage.  Control Your Mealtime Environment  Serve your plate of food at the stove or kitchen counter. Do not put the serving dishes on the table. If you do put dishes on the table, remove them immediately when finished eating. Fill half of your plate with vegetables, a quarter with lean protein and a quarter with starch. Use smaller plates, bowls and glasses. A smaller portion will look large when it is in a little dish. Politely refuse second helpings. When fixing your plate, limit portions of food to one scoop/serving or less.   Daily Food Management  Replace eating with another activity that you will not associate with food. Wait 20 minutes before eating something you are craving. Drink a large glass of water or diet soda before eating. Always have a big glass or bottle of water to drink throughout the day. Avoid high-calorie add-ons such as cream with your coffee, butter, mayonnaise and salad dressings.  Shopping: Do not shop when hungry or tired. Shop from a list and avoid buying anything that is not on your list. If you must have tempting foods, buy individual-sized packages and try to find a lower-calorie alternative. Don't taste test in the store. Read food labels. Compare products to help you make the healthiest choices.  Preparation: Chew a piece of gum while cooking meals. Use a quarter teaspoon if you taste test your food. Try to only fix  what you are going to eat, leaving yourself no chance for seconds. If you have prepared more food than you need, portion it into individual containers and freeze or refrigerate immediately. Don't snack while cooking meals.  Eating: Eat slowly. Remember it takes about 20 minutes for your stomach to send a message to your brain that it is full. Don't let fake  hunger make you think you need more. The ideal way to eat is to take a bite, put your utensil down, take a sip of water, cut your next bite, take a bit, put your utensil down and so on. Do not cut your food all at one time. Cut only as needed. Take small bites and chew your food well. Stop eating for a minute or two at least once during a meal or snack. Take breaks to reflect and have conversation.  Cleanup and Leftovers: Label leftovers for a specific meal or snack. Freeze or refrigerate individual portions of leftovers. Do not clean up if you are still hungry.  Eating Out and Social Eating  Do not arrive hungry. Eat something light before the meal. Try to fill up on low-calorie foods, such as vegetables and fruit, and eat smaller portions of the high-calorie foods. Eat foods that you like, but choose small portions. If you want seconds, wait at least 20 minutes after you have eaten to see if you are actually hungry or if your eyes are bigger than your stomach. Limit alcoholic beverages. Try a soda water with a twist of lime. Do not skip other meals in the day to save room for the special event.  At Restaurants: Order  la carte rather than buffet style. Order some vegetables or a salad for an appetizer instead of eating bread. If you order a high-calorie dish, share it with someone. Try an after-dinner mint with your coffee. If you do have dessert, share it with two or more people. Don't overeat because you do not want to waste food. Ask for a doggie bag to take extra food home. Tell the server to put half of your entree in a to go bag before the meal is served to you. Ask for salad dressing, gravy or high-fat sauces on the side. Dip the tip of your fork in the dressing before each bite. If bread is served, ask for only one piece. Try it plain without butter or oil. At [employer] where oil and vinegar is served with bread, use only a small amount of oil and a lot of vinegar for  dipping.  At a Friend's House: Offer to bring a dish, appetizer or dessert that is low in calories. Serve yourself small portions or tell the host that you only want a small amount. Stand or sit away from the snack table. Stay away from the kitchen or stay busy if you are near the food. Limit your alcohol intake.  At [employer] and Cafeterias: Cover most of your plate with lettuce and/or vegetables. Use a salad plate instead of a dinner plate. After eating, clear away your dishes before having coffee or tea.  Entertaining at Home: Explore low-fat, low-cholesterol cookbooks. Use single-serving foods like chicken breasts or hamburger patties. Prepare low-calorie appetizers and desserts.   Holidays: Keep tempting foods out of sight. Decorate the house without using food. Have low-calorie beverages and foods on hand for guests. Allow yourself one planned treat a day. Don't skip meals to save up for the holiday feast. Eat regular, planned meals.   Exercise Well  Make exercise a priority and a planned activity in the day. If possible, walk the entire or part of the distance to work. Get an exercise buddy. Go for a walk with a colleague during one of your breaks, go to the gym, run or take a walk with a friend, walk in the mall with a shopping companion. Park at the end of the parking lot and walk to the store or office entrance. Always take the stairs all of the way or at least part of the way to your floor. If you have a desk job, walk around the office frequently. Do leg lifts while sitting at your desk. Do something outside on the weekends like going for a hike or a bike ride.   Have a Healthy Attitude  Make health your weight management priority. Be realistic. Have a goal to achieve a healthier you, not necessarily the lowest weight or ideal weight based on calculations or tables. Focus on a healthy eating style, not on dieting. Dieting usually lasts for a short amount of time  and rarely produces long-term success. Think long term. You are developing new healthy behaviors to follow next month, in a year and in a decade.    This information is for educational purposes only and is not intended to replace the advice of your doctor or health care provider. We encourage you to discuss with your doctor any questions or concerns you may have.        Guidelines for Losing Weight   We want weight loss that will last so you should lose 1-2 pounds a week.  THAT IS IT! Please pick THREE things a month to change. Once it is a habit check off the item. Then pick another three items off the list to become habits.  If you are already doing a habit on the list GREAT!  Cross that item off!  Dont drink your calories. Ie, alcohol, soda, fruit juice, and sweet tea.   Drink more water. Drink a glass when you feel hungry or before each meal.   Eat breakfast - Complex carb and protein (likeDannon light and fit yogurt, oatmeal, fruit, eggs, [place] bacon).  Measure your cereal.  Eat no more than one cup a day. (ie Kashi)  Eat an apple a day.  Add a vegetable a day.  Try a new vegetable a month.  Use Pam! Stop using oil or butter to cook.  Dont finish your plate or use smaller plates.  Share your dessert.  Eat sugar free Jello for dessert or frozen grapes.  Dont eat 2-3 hours before bed.  Switch to whole wheat bread, pasta, and brown rice.  Make healthier choices when you eat out. No fries!  Pick baked chicken, NOT fried.  Dont forget to SLOW DOWN when you eat. It is not going anywhere.   Take the stairs.  Park far away in the parking lot  Lift soup cans (or weights) for 10 minutes while watching TV.  Walk at work for 10 minutes during break.  Walk outside 1 time a week with your friend, kids, dog, or significant other.  Start a walking group at church.  Walk the mall as much as you can tolerate.   Keep a food diary.  Weigh yourself daily.  Walk  for 15 minutes 3 days per week.  Cook at home more often and eat out less. If life happens and you go back to old habits, it is okay.  Just start over. You  can do it!  If you experience chest pain, get short of breath, or tired during the exercise, please stop immediately and inform your doctor.    Before you even begin to attack a weight-loss plan, it pays to remember this: You are not fat. You have fat. Losing weight isn't about blame or shame; it's simply another achievement to accomplish. Dieting is like any other skill--you have to buckle down and work at it. As long as you act in a smart, reasonable way, you'll ultimately get where you want to be. Here are some weight loss pearls for you.   1. It's Not a Diet. It's a Lifestyle Thinking of a diet as something you're on and suffering through only for the short term doesn't work. To shed weight and keep it off, you need to make permanent changes to the way you eat. It's OK to indulge occasionally, of course, but if you cut calories temporarily and then revert to your old way of eating, you'll gain back the weight quicker than you can say yo-yo. Use it to lose it. Research shows that one of the best predictors of long-term weight loss is how many pounds you drop in the first month. For that reason, nutritionists often suggest being stricter for the first two weeks of your new eating strategy to build momentum. Cut out added sugar and alcohol and avoid unrefined carbs. After that, figure out how you can reincorporate them in a way that's healthy and maintainable.  2. There's a Right Way to Exercise Working out burns calories and fat and boosts your metabolism by building muscle. But those trying to lose weight are notorious for overestimating the number of calories they burn and underestimating the amount they take in. Unfortunately, your system is biologically programmed to hold on to extra pounds and that means when you start exercising, your body  senses the deficit and ramps up its hunger signals. If you're not diligent, you'll eat everything you burn and then some. Use it, to lose it. Cardio gets all the exercise glory, but strength and interval training are the real heroes. They help you build lean muscle, which in turn increases your metabolism and calorie-burning ability 3. Don't Overreact to Mild Hunger Some people have a hard time losing weight because of hunger anxiety. To them, being hungry is bad--something to be avoided at all costs--so they carry snacks with them and eat when they don't need to. Others eat because they're stressed out or bored. While you never want to get to the point of being ravenous (that's when bingeing is likely to happen), a hunger pang, a craving, or the fact that it's 3:00 p.m. should not send you racing for the vending machine or obsessing about the energy bar in your purse. Ideally, you should put off eating until your stomach is growling and it's difficult to concentrate.  Use it to lose it. When you feel the urge to eat, use the HALT method. Ask yourself, Am I really hungry? Or am I angry or anxious, lonely or bored, or tired? If you're still not certain, try the apple test. If you're truly hungry, an apple should seem delicious; if it doesn't, something else is going on. Or you can try drinking water and making yourself busy, if you are still hungry try a healthy snack.  4. Not All Calories Are Created Equal The mechanics of weight loss are pretty simple: Take in fewer calories than you use for energy. But the kind of  food you eat makes all the difference. Processed food that's high in saturated fat and refined starch or sugar can cause inflammation that disrupts the hormone signals that tell your brain you're full. The result: You eat a lot more.  Use it to lose it. Clean up your diet. Swap in whole, unprocessed foods, including vegetables, lean protein, and healthy fats that will fill you up and give you the  biggest nutritional bang for your calorie buck. In a few weeks, as your brain starts receiving regular hunger and fullness signals once again, you'll notice that you feel less hungry overall and naturally start cutting back on the amount you eat.  5. Protein, Produce, and Plant-Based Fats Are Your Weight-Loss Trinity Here's why eating the three Ps regularly will help you drop pounds. Protein fills you up. You need it to build lean muscle, which keeps your metabolism humming so that you can torch more fat. People in a weight-loss program who ate double the recommended daily allowance for protein (about 110 grams for a 150-pound woman) lost 70 percent of their weight from fat, while people who ate the RDA lost only about 40 percent, one study found. Produce is packed with filling fiber. "It's very difficult to consume too many calories if you're eating a lot of vegetables. Example: Three cups of broccoli is a lot of food, yet only 93 calories. (Fruit is another story. It can be easy to overeat and can contain a lot of calories from sugar, so be sure to monitor your intake.) Plant-based fats like olive oil and those in avocados and nuts are healthy and extra satiating.  Use it to lose it. Aim to incorporate each of the three Ps into every meal and snack. People who eat protein throughout the day are able to keep weight off, according to a study in the American Journal of Clinical Nutrition. In addition to meat, poultry and seafood, good sources are beans, lentils, eggs, tofu, and yogurt. As for fat, keep portion sizes in check by measuring out salad dressing, oil, and nut butters (shoot for one to two tablespoons). Finally, eat veggies or a little fruit at every meal. People who did that consumed 308 fewer calories but didn't feel any hungrier than when they didn't eat more produce.  7. How You Eat Is As Important As What You Eat In order for your brain to register that you're full, you need to focus on what  you're eating. Sit down whenever you eat, preferably at a table. Turn off the TV or computer, put down your phone, and look at your food. Smell it. Chew slowly, and don't put another bite on your fork until you swallow. When women ate lunch this attentively, they consumed 30 percent less when snacking later than those who listened to an audiobook at lunchtime, according to a study in the [place] Journal of Nutrition. 8. Weighing Yourself Really Works The scale provides the best evidence about whether your efforts are paying off. Seeing the numbers tick up or down or stagnate is motivation to keep going--or to rethink your approach. A 2015 study at [facility] found that daily weigh-ins helped people lose more weight, keep it off, and maintain that loss, even after two years. Use it to lose it. Step on the scale at the same time every day for the best results. If your weight shoots up several pounds from one weigh-in to the next, don't freak out. Eating a lot of salt the night before or  having your period is the likely culprit. The number should return to normal in a day or two. It's a steady climb that you need to do something about. 9. Too Much Stress and Too Little Sleep Are Your Enemies When you're tired and frazzled, your body cranks up the production of cortisol, the stress hormone that can cause carb cravings. Not getting enough sleep also boosts your levels of ghrelin, a hormone associated with hunger, while suppressing leptin, a hormone that signals fullness and satiety. People on a diet who slept only five and a half hours a night for two weeks lost 55 percent less fat and were hungrier than those who slept eight and a half hours, according to a study in the [place] [employer] Journal. Use it to lose it. Prioritize sleep, aiming for seven hours or more a night, which research shows helps lower stress. And make sure you're getting quality zzz's. If a snoring spouse or a fidgety cat  wakes you up frequently throughout the night, you may end up getting the equivalent of just four hours of sleep, according to a study from [facility]. Keep pets out of the bedroom, and use a white-noise app to drown out snoring. 10. You Will Hit a plateau--And You Can Bust Through It As you slim down, your body releases much less leptin, the fullness hormone.  If you're not strength training, start right now. Building muscle can raise your metabolism to help you overcome a plateau. To keep your body challenged and burning calories, incorporate new moves and more intense intervals into your workouts or add another sweat session to your weekly routine. Alternatively, cut an extra 100 calories or so a day from your diet. Now that you've lost weight, your body simply doesn't need as much fuel.    Since food equals calories, in order to lose weight you must either eat fewer calories, exercise more to burn off calories with activity, or both. Food that is not used to fuel the body is stored as fat. A major component of losing weight is to make smarter food choices. Here's how:  1)   Limit non-nutritious foods, such as: Sugar, honey, syrups and candy Pastries, donuts, pies, cakes and cookies Soft drinks, sweetened juices and alcoholic beverages  2)  Cut down on high-fat foods by: - Choosing poultry, fish or lean red meat - Choosing low-fat cooking methods, such as baking, broiling, steaming, grilling and boiling - Using low-fat or non-fat dairy products - Using vinaigrette, herbs, lemon or fat-free salad dressings - Avoiding fatty meats, such as bacon, sausage, franks, ribs and luncheon meats - Avoiding high-fat snacks like nuts, chips and chocolate - Avoiding fried foods - Using less butter, margarine, oil and mayonnaise - Avoiding high-fat gravies, cream sauces and cream-based soups  3) Eat a variety of foods, including: - Fruit and vegetables that are raw, steamed or baked - Whole  grains, breads, cereal, rice and pasta - Dairy products, such as low-fat or non-fat milk or yogurt, low-fat cottage cheese and low-fat cheese - Protein-rich foods like chicken, [place], fish, lean meat and legumes, or beans  4) Change your eating habits by: - Eat three balanced meals a day to help control your hunger - Watch portion sizes and eat small servings of a variety of foods - Choose low-calorie snacks - Eat only when you are hungry and stop when you are satisfied - Eat slowly and try not to perform other tasks while eating - Find other  activities to distract you from food, such as walking, taking up a hobby or being involved in the community - Include regular exercise in your daily routine ( minimum of 20 min of moderate-intensity exercise at least 5 days/week)  - Find a support group, if necessary, for emotional support in your weight loss journey           Easy ways to cut 100 calories   1. Eat your eggs with hot sauce OR salsa instead of cheese.  Eggs are great for breakfast, but many people consider eggs and cheese to be BFFs. Instead of cheese--1 oz. of cheddar has 114 calories--top your eggs with hot sauce, which contains no calories and helps with satiety and metabolism. Salsa is also a great option!!  2. Top your toast, waffles or pancakes with fresh berries instead of jelly or syrup. Half a cup of berries--fresh, frozen or thawed--has about 40 calories, compared with 2 tbsp. of maple syrup or jelly, which both have about 100 calories. The berries will also give you a good punch of fiber, which helps keep you full and satisfied and wont spike blood sugar quickly like the jelly or syrup. 3. Swap the non-fat latte for black coffee with a splash of half-and-half. Contrary to its name, that non-fat latte has 130 calories and a startling 19g of carbohydrates per 16 oz. serving. Replacing that light drinkable dessert with a black coffee with a splash of half-and-half saves  you more than 100 calories per 16 oz. serving. 4. Sprinkle salads with freeze-dried raspberries instead of dried cranberries. If you want a sweet addition to your nutritious salad, stay away from dried cranberries. They have a whopping 130 calories per  cup and 30g carbohydrates. Instead, sprinkle freeze-dried raspberries guilt-free and save more than 100 calories per  cup serving, adding 3g of belly-filling fiber. 5. Go for mustard in place of mayo on your sandwich. Mustard can add really nice flavor to any sandwich, and there are tons of varieties, from spicy to honey. A serving of mayo is 95 calories, versus 10 calories in a serving of mustard.  Or try an avocado mayo spread: You can find the recipe few click this link: https://www.californiaavocado.com/recipes/recipe-container/california-avocado-mayo 6. Choose a DIY salad dressing instead of the store-bought kind. Mix Dijon or whole grain mustard with low-fat Kefir or red wine vinegar and garlic. 7. Use hummus as a spread instead of a dip. Use hummus as a spread on a high-fiber cracker or tortilla with a sandwich and save on calories without sacrificing taste. 8. Pick just one salad accessory. Salad isnt automatically a calorie winner. Its easy to over-accessorize with toppings. Instead of topping your salad with nuts, avocado and cranberries (all three will clock in at 313 calories), just pick one. The next day, choose a different accessory, which will also keep your salad interesting. You dont wear all your jewelry every day, right? 9. Ditch the white pasta in favor of spaghetti squash. One cup of cooked spaghetti squash has about 40 calories, compared with traditional spaghetti, which comes with more than 200. Spaghetti squash is also nutrient-dense. Its a good source of fiber and Vitamins A and C, and it can be eaten just like you would eat pasta--with a great tomato sauce and [place] meatballs or with pesto, tofu and spinach, for  example. 10. Dress up your chili, soups and stews with non-fat [place] yogurt instead of sour cream. Just a dollop of sour cream can set you back 115 calories and  a whopping 12g of fat--seven of which are of the artery-clogging variety. Added bonus: [place] yogurt is packed with muscle-building protein, calcium and B Vitamins. 11. Mash cauliflower instead of mashed potatoes. One cup of traditional mashed potatoes--in all their creamy goodness--has more than 200 calories, compared to mashed cauliflower, which you can typically eat for less than 100 calories per 1 cup serving. Cauliflower is a great source of the antioxidant indole-3-carbinol (I3C), which may help reduce the risk of some cancers, like breast cancer. 12. Ditch the ice cream sundae in favor of a [place] yogurt parfait. Instead of a cup of ice cream or fro-yo for dessert, try 1 cup of nonfat Greek yogurt topped with fresh berries and a sprinkle of cacao nibs. Both toppings are packed with antioxidants, which can help reduce cellular inflammation and oxidative damage. And the comparison is a no-brainer: One cup of ice cream has about 275 calories; one cup of frozen yogurt has about 230; and a cup of Greek yogurt has just 130, plus twice the protein, so youre less likely to return to the freezer for a second helping. 13. Put olive oil in a spray container instead of using it directly from the bottle. Each tablespoon of olive oil is 120 calories and 15g of fat. Use a mister instead of pouring it straight into the pan or onto a salad. This allows for portion control and will save you more than 100 calories. 14. When baking, substitute canned pumpkin for butter or oil. Canned pumpkin--not pumpkin pie mix--is loaded with Vitamin A, which is important for skin and eye health, as well as immunity. And the comparisons are pretty crazy:  cup of canned pumpkin has about 40 calories, compared to butter or oil, which has more than 800 calories. Yes, 800  calories. Applesauce and mashed banana can also serve as good substitutions for butter or oil, usually in a 1:1 ratio. 15. Top casseroles with high-fiber cereal instead of breadcrumbs. Breadcrumbs are typically made with white bread, while breakfast cereals contain 5-9g of fiber per serving. Not only will you save more than 150 calories per  cup serving, the swap will also keep you more full and youll get a metabolism boost from the added fiber. 16. Snack on pistachios instead of macadamia nuts. Believe it or not, you get the same amount of calories from 35 pistachios (100 calories) as you would from only five macadamia nuts. 17. Chow down on kale chips rather than potato chips. This is my favorite dont knock it till you try it swap. Kale chips are so easy to make at home, and you can spice them up with a little grated parmesan or chili powder. Plus, theyre a mere fraction of the calories of potato chips, but with the same crunch factor we crave so often. 18. Add seltzer and some fruit slices to your cocktail instead of soda or fruit juice. One cup of soda or fruit juice can pack on as much as 140 calories. Instead, use seltzer and fruit slices. The fruit provides valuable phytochemicals, such as flavonoids and anthocyanins, which help to combat cancer and stave off the aging process.

## 2018-06-09 DIAGNOSIS — R000 Tachycardia, unspecified: Secondary | ICD-10-CM | POA: Insufficient documentation

## 2018-06-09 DIAGNOSIS — R5381 Other malaise: Secondary | ICD-10-CM | POA: Insufficient documentation

## 2018-06-09 DIAGNOSIS — R51 Headache: Secondary | ICD-10-CM

## 2018-06-09 DIAGNOSIS — R519 Headache, unspecified: Secondary | ICD-10-CM | POA: Insufficient documentation

## 2018-06-09 DIAGNOSIS — Z7182 Exercise counseling: Secondary | ICD-10-CM | POA: Insufficient documentation

## 2018-06-09 DIAGNOSIS — Z713 Dietary counseling and surveillance: Secondary | ICD-10-CM | POA: Insufficient documentation

## 2018-07-09 ENCOUNTER — Ambulatory Visit (INDEPENDENT_AMBULATORY_CARE_PROVIDER_SITE_OTHER): Payer: No Typology Code available for payment source | Admitting: Family Medicine

## 2018-07-09 ENCOUNTER — Encounter: Payer: Self-pay | Admitting: Family Medicine

## 2018-07-09 VITALS — BP 116/87 | HR 82 | Temp 97.6°F | Ht 63.0 in | Wt 184.4 lb

## 2018-07-09 DIAGNOSIS — Z7182 Exercise counseling: Secondary | ICD-10-CM

## 2018-07-09 DIAGNOSIS — R41840 Attention and concentration deficit: Secondary | ICD-10-CM | POA: Diagnosis not present

## 2018-07-09 DIAGNOSIS — E669 Obesity, unspecified: Secondary | ICD-10-CM

## 2018-07-09 DIAGNOSIS — Z713 Dietary counseling and surveillance: Secondary | ICD-10-CM

## 2018-07-09 NOTE — Patient Instructions (Addendum)
Please look up meditations for mindfulness and eating. These will help you focus on insight into your habits.  Goals: 1. Log all food to track your intake regularly and accurately.  Simplify and track on one app. 2. Two fifteen minute breaks, where you practice mindfulness/meditation. 3. Don't be so hard on yourself!  You be nicer to you.  Weight loss goal of 4-8 lbs.

## 2018-07-09 NOTE — Progress Notes (Signed)
Wt loss OV note  Impression and Recommendations:    1. Obesity, Class I, BMI 30-34.9   2. Weight loss counseling, encounter for   3. Attention and concentration deficit   4. Dietary counseling   5. Exercise counseling      - Goals: 1. Log all food to track intake regularly and accurately.  Simplify and track on one app. 2. Two fifteen minute breaks to practice mindfulness/meditation. 3. Don't be so hard on yourself!  You be nicer to you. - Weight loss goal of 4-8 lbs.  - Reminded patient the need for yearly complete physical exam office visits in addition to office visits for management of the chronic diseases.  1. Attention Deficit - Patient's focus has improved as she has adopted healthier habits. - Encouraged patient to continue with following more prudent habits as advised.  2. BMI Counseling & Weight Management - Body mass index is 32.66 kg/m: Explained to patient what BMI refers to, and what it means medically.  Told patient to think about it as a "medical risk stratification measurement" and how increasing BMI is associated with increasing risk/ or worsening state of various diseases such as hypertension, hyperlipidemia, diabetes, premature OA, depression etc.  American Heart Association guidelines for healthy diet, basically Mediterranean diet, and exercise guidelines of 30 minutes 5 days per week or more discussed in detail.  Health counseling performed.  All questions answered.  - Advised patient to assess her relationship with food, why she eats, what food means to her, and how to navigate this along with her social settings.  - Encouraged patient to follow mindfulness meditation, relaxing more, and grounding herself more in the moment to help her focus on her habits.  - Discussed that logging foods is a good way to become more aware of our intake habits and reflect upon them.  3. Lifestyle & Preventative Health Maintenance - Advised patient to continue  working toward exercising to improve overall mental, physical, and emotional health.    - Reviewed the "spokes of the wheel" of mood and health management.  Stressed the importance of ongoing prudent habits, including regular exercise, appropriate sleep hygiene, healthful dietary habits, and prayer/meditation to relax.  - Encouraged patient to engage in daily physical activity, especially a formal exercise routine.  Recommended that the patient eventually strive for at least 150 minutes of moderate cardiovascular activity per week according to guidelines established by the [facility].   - Healthy dietary habits encouraged, including low-carb, and high amounts of lean protein in diet.   - Patient should also consume adequate amounts of water.   Education and routine counseling performed. Handouts provided.   The patient was counseled, risk factors were discussed, anticipatory guidance given.  -Gross side effects, risk and benefits, and alternatives of medications discussed with patient.  Patient is aware that all medications have potential side effects and we are unable to predict every side effect or drug-drug interaction that may occur.  Expresses verbal understanding and consents to current therapy plan and treatment regimen.   Return in about 4 weeks (around 08/06/2018) for weight loss follow up 4 wks.   Please see AVS handed out to patient at the end of our visit for further patient instructions/ counseling done pertaining to today's office visit.    Note:  This document was prepared using Dragon voice recognition software and may include unintentional dictation errors.  This document serves as a record of services personally performed by Thomasene Loteborah Jaunice Mirza, DO.  It was created on her behalf by [name], a trained medical scribe. The creation of this record is based on the scribe's personal observations and the provider's statements to them.   I have reviewed the above medical  documentation for accuracy and completeness and I concur.  Thomasene Lot, DO 07/09/2018 8:53 PM      ______________________________________________________________________    Subjective:  HPI: Stefanie White y.o. female  presents for 3 month follow up for multiple medical problems.  Work is tiring, but getting better.  Notes she lost 3.5 lbs at home on her scales.  She has been weighing herself once per week, but sometimes gets antsy and weighs herself earlier.  Her friend came into town this past weekend, and she feels she might have overindulged.  Went to [employer] and had tater tots, went to [employer] by [employer].  Has been trying to cut back on drinking her calories, and only had two glasses of wine.  However, she also ate [place] food, [place] food, and comments "I was bad."  Thinks she was around 5 lbs of weight loss prior to indulging on the weekend.   Feels her hardest thing this month has been staying away from sweets.  However, she feels she's been doing well with consuming her lunchtime shakes.  She loves Premier Protein; uses it as a meal supplement.  She typically works through her lunch and has an actual meal for dinner.  Feels she has been sticking to the salad bar for dinner when she sees something that looks like it has more carbs or is less healthy.  Sometimes she takes 15 minutes to make an extra lab on the first floor, trying to encourage herself to walk more.  At [facility] she meets her walking goals, but at [place] she feels she never quite can.  Averaging about 10,000 steps per day.  She is not currently tracking.  She was using [employer]itness[employer] but hasn't tracked for the last week and a half, "because I suck at this."  She was using three apps to track her intake and activities.  Comments that she really wants to lose the weight and knows that this is about getting healthy, but is concerned she may not be in the right mindset.  Attention Improvements Her dietary  changes have helped with her focus and attention, and decreasing the sugar content in her diet has caused a big difference.   Weight:  Wt Readings from Last 3 Encounters:  07/09/18 184 lb 6.4 oz (83.6 kg)  06/07/18 188 lb (85.3 kg)  04/30/18 183 lb (83 kg)   BMI Readings from Last 3 Encounters:  07/09/18 32.66 kg/m  06/07/18 33.30 kg/m  04/30/18 32.42 kg/m   Lab Results  Component Value Date   HGBA1C 5.2 05/14/2017    Review of Systems: General:   No F/C, wt loss Pulm:   No DIB, SOB, pleuritic chest pain Card:  No CP, palpitations Abd:  No n/v/d or pain Ext:  No inc edema from baseline   Objective: Physical Exam: BP 116/87   Pulse 82   Temp 97.6 F (36.4 C)   Ht 5\' 3"  (1.6 m)   Wt 184 lb 6.4 oz (83.6 kg)   LMP 06/18/2018 (Approximate)   SpO2 99%   BMI 32.66 kg/m  Body mass index is 32.66 kg/m. General: Well nourished, in no apparent distress. Eyes: PERRLA, EOMs, conjunctiva clr no swelling or erythema ENT/Mouth: Hearing appears normal.  Mucus Membranes Moist  Neck: Supple, no  masses Resp: Respiratory effort- normal, ECTA B/L w/o W/R/R  Cardio: RRR w/o MRGs. Abdomen: no gross distention. Lymphatics:  Brisk peripheral pulses, less 2 sec cap RF, no gross edema  M-sk: Full ROM, 5/5 strength, normal gait.  Skin: Warm, dry without rashes, lesions, ecchymosis.  Neuro: Alert, Oriented Psych: Normal affect, Insight and Judgment appropriate.    Current Medications:  Current Outpatient Medications on File Prior to Visit  Medication Sig Dispense Refill  . amphetamine-dextroamphetamine (ADDERALL XR) 10 MG 24 hr capsule Take 1 capsule (10 mg total) by mouth daily. 90 capsule 0  . aspirin-acetaminophen-caffeine (EXCEDRIN MIGRAINE) 250-250-65 MG tablet Take 1-2 tablets by mouth every 6 (six) hours as needed for headache.    . Cholecalciferol (VITAMIN D) 2000 units CAPS Take 1 capsule by mouth daily.    . fexofenadine-pseudoephedrine (ALLEGRA-D ALLERGY & CONGESTION)  180-240 MG 24 hr tablet Take 1 tablet by mouth daily. 90 tablet 1  . fluticasone (FLONASE) 50 MCG/ACT nasal spray Place 1 spray into both nostrils 2 (two) times daily. 16 g 6  . Multiple Vitamin (MULTIVITAMIN) capsule Take 1 capsule by mouth daily.    . Norethindrone Acet-Ethinyl Est (LARIN 1/20 PO) Take 1 tablet by mouth daily.    . ondansetron (ZOFRAN) 4 MG tablet Take 1 tablet (4 mg total) by mouth every 8 (eight) hours as needed for nausea or vomiting. 20 tablet 0   No current facility-administered medications on file prior to visit.     Medical History:  Patient Active Problem List   Diagnosis Date Noted  . Tachycardia 06/09/2018  . Sinus headache 06/09/2018  . Physical deconditioning 06/09/2018  . Dietary counseling 06/09/2018  . Exercise counseling 06/09/2018  . Sleeping difficulties 01/17/2018  . Contact dermatitis and eczema-bilateral axilla 10/11/2017  . Attention and concentration deficit 08/29/2017  . Carbuncle and furuncle of leg 03/08/2017  . Vitamin D insufficiency 05/22/2016  . Obesity, Class I, BMI 30-34.9 05/22/2016  . Low serum HDL 05/22/2016  . Acute left otitis media 03/03/2016  . Otitis, externa, infective 03/03/2016  . Pharyngitis 03/03/2016  . Fever, unspecified 03/03/2016  . Migraine headache 03/01/2016  . History of ADHD 03/01/2016  . Environmental and seasonal allergies 03/01/2016  . Viral URI 03/01/2016  . h/o Alopecia areata 05/22/2014    Allergies:  Allergies  Allergen Reactions  . Rizatriptan Swelling    Mouth swelling     Family history-  Reviewed; changed as appropriate  Social history-  Reviewed; changed as appropriate

## 2018-07-18 MED FILL — NORETHIN-ESTRAD-FERR 1-0.02: 1-20 | 84 days supply | Qty: 84 | Fill #2

## 2018-08-07 ENCOUNTER — Encounter: Payer: Self-pay | Admitting: Family Medicine

## 2018-08-07 ENCOUNTER — Ambulatory Visit (INDEPENDENT_AMBULATORY_CARE_PROVIDER_SITE_OTHER): Payer: No Typology Code available for payment source | Admitting: Family Medicine

## 2018-08-07 VITALS — BP 109/74 | HR 70 | Temp 98.2°F | Ht 63.0 in | Wt 186.8 lb

## 2018-08-07 DIAGNOSIS — G479 Sleep disorder, unspecified: Secondary | ICD-10-CM | POA: Diagnosis not present

## 2018-08-07 DIAGNOSIS — E669 Obesity, unspecified: Secondary | ICD-10-CM

## 2018-08-07 DIAGNOSIS — Z713 Dietary counseling and surveillance: Secondary | ICD-10-CM | POA: Diagnosis not present

## 2018-08-07 NOTE — Progress Notes (Signed)
Wt loss OV note  Impression and Recommendations:    1. Obesity, Class I, BMI 30-34.9   2. Dietary counseling   3. Sleeping difficulties      1. Weight Mgt & Dietary Counseling - Encouraged patient to practice self-love and stop beating herself up for not meeting goals. - Reviewed that patient should be emotionally and mentally ready for change.  - Discussed that patient must put herself first more and practice mindfulness. - Encouraged patient to begin meditation.  Explained to patient what BMI refers to, and what it means medically.    Told patient to think about it as a "medical risk stratification measurement" and how increasing BMI is associated with increasing risk/ or worsening state of various diseases such as hypertension, hyperlipidemia, diabetes, premature OA, depression etc.  American Heart Association guidelines for healthy diet, basically Mediterranean diet, and exercise guidelines of 30 minutes 5 days per week or more discussed in detail.  -Reminded patient the need for yearly complete physical exam office visits in addition to office visits for management of the chronic diseases  - Will resume weight loss goals in April.  2. Stress & Sleeplessness - Encouraged patient to shoot for 7-8 hours of sleep per night. - Prudent sleep hygiene discussed at length.  - Advised patient to practice sleep meditation and exercise as often as possible.  - Reviewed the "spokes of the wheel" of mood and health management.  Stressed the importance of ongoing prudent habits, including regular exercise, appropriate sleep hygiene, healthful dietary habits, and prayer/meditation to relax.  3. Foot Pain - Plantar Fasciitis. - Discussed that most common reason for foot pain is inadequate arch support. - Educated patient regarding need for ongoing good arch support.  - Advised patient to visit [employer] and invest in good arch support.  4. Lifestyle & Preventative Health  Maintenance - Advised patient to continue working toward exercising to improve overall mental, physical, and emotional health.    - Encouraged patient to engage in daily physical activity, especially a formal exercise routine.  Recommended that the patient eventually strive for at least 150 minutes of moderate cardiovascular activity per week according to guidelines established by the [facility].   - Healthy dietary habits encouraged, including low-carb, and high amounts of lean protein in diet.   - Patient should also consume adequate amounts of water.    Education and routine counseling performed. Handouts provided.   The patient was counseled, risk factors were discussed, anticipatory guidance given.  -Gross side effects, risk and benefits, and alternatives of medications discussed with patient.  Patient is aware that all medications have potential side effects and we are unable to predict every side effect or drug-drug interaction that may occur.  Expresses verbal understanding and consents to current therapy plan and treatment regimen.   Return for 2) f/up in april .   Please see AVS handed out to patient at the end of our visit for further patient instructions/ counseling done pertaining to today's office visit.    Note:  This document was prepared using Dragon voice recognition software and may include unintentional dictation errors.    This document serves as a record of services personally performed by Mellody Dance, DO. It was created on her behalf by Toni Amend, a trained medical scribe. The creation of this record is based on the scribe's personal observations and the provider's statements to them.   I have reviewed the above medical documentation for accuracy and  completeness and I concur.  Mellody Dance, DO 08/09/2018 5:01 PM      ______________________________________________________________________    Subjective:  HPI: Stefanie White y.o. female   presents for 3 month follow up for multiple medical problems.  Patient notes she's feeling overstressed.  "I've worked myself too much this month."  Scheduled herself very full in February.  Has her wedding in two weeks.  "All that last minute crap."  "We're just going to the courthouse to get married, and that's all I wanted it to be."  However, due to family pressure, notes she had to plan a reception in two months.  "It happened, I guess; I just have all this little stuff I have to get done."    Decided she is not going to pick up extra shifts at [employer] for at least two months after their wedding.  She's hoping she can focus more on herself during that time.  Weight Loss Goals & Work Stress States she met none of her goals.  However, she has used one app.  Says "I'll do good if I'm at [place]," regarding tracking.  Says "I might not get every meal every day, but I at least have something."  It's when she's off or running around picking up extra shifts at [employer] that she doesn't track well.  Works 7 days on, 7 days off, 10 hour shifts during week days, 11 hour shifts on the weekends.  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  Feels she may have overdone her work schedule lately.  Sleep Habits Sleeping on and off.  States she gets maybe 6-7 hours of sleep most of the time.  Notes she usually tosses and turns for a good hour before going to bed.  Foot Pain Notes she's always wearing tennis shoes, and gets 15,000 steps while working many days, especially at [employer].  Mostly feels foot pain at night when she gets home.  States "literally limping old lady style at home."   Weight:  Wt Readings from Last 3 Encounters:  08/07/18 186 lb 12.8 oz (84.7 kg)  07/09/18 184 lb 6.4 oz (83.6 kg)  06/07/18 188 lb (85.3 kg)   BMI Readings from Last 3 Encounters:  08/07/18 33.09 kg/m  07/09/18 32.66 kg/m  06/07/18 33.30 kg/m   Lab Results   Component Value Date   HGBA1C 5.2 05/14/2017    Review of Systems: General:   No F/C, wt loss Pulm:   No DIB, SOB, pleuritic chest pain Card:  No CP, palpitations Abd:  No n/v/d or pain Ext:  No inc edema from baseline   Objective: Physical Exam: BP 109/74   Pulse 70   Temp 98.2 F (36.8 C)   Ht _0  (1.6 m)   Wt 186 lb 12.8 oz (84.7 kg)   LMP 07/24/2018 (Approximate)   SpO2 100%   BMI 33.09 kg/m  Body mass index is 33.09 kg/m. General: Well nourished, in no apparent distress. Eyes: PERRLA, EOMs, conjunctiva clr no swelling or erythema ENT/Mouth: Hearing appears normal.  Mucus Membranes Moist  Neck: Supple, no masses Resp: Respiratory effort- normal, ECTA B/L w/o W/R/R  Cardio: RRR w/o MRGs. Abdomen: no gross distention. Lymphatics:  Brisk peripheral pulses, less 2 sec cap RF, no gross edema  M-sk: Full ROM, 5/5 strength, normal gait.  Skin: Warm, dry without rashes, lesions, ecchymosis.  Neuro: Alert, Oriented Psych: Normal affect, Insight and  Judgment appropriate.    Current Medications:  Current Outpatient Medications on File Prior to Visit  Medication Sig Dispense Refill  . amphetamine-dextroamphetamine (ADDERALL XR) 10 MG 24 hr capsule Take 1 capsule (10 mg total) by mouth daily. 90 capsule 0  . aspirin-acetaminophen-caffeine (EXCEDRIN MIGRAINE) 250-250-65 MG tablet Take 1-2 tablets by mouth every 6 (six) hours as needed for headache.    . Cholecalciferol (VITAMIN D) 2000 units CAPS Take 1 capsule by mouth daily.    . fexofenadine-pseudoephedrine (ALLEGRA-D ALLERGY & CONGESTION) 180-240 MG 24 hr tablet Take 1 tablet by mouth daily. 90 tablet 1  . fluticasone (FLONASE) 50 MCG/ACT nasal spray Place 1 spray into both nostrils 2 (two) times daily. 16 g 6  . Multiple Vitamin (MULTIVITAMIN) capsule Take 1 capsule by mouth daily.    . Norethindrone Acet-Ethinyl Est (LARIN 1/20 PO) Take 1 tablet by mouth daily.    . ondansetron (ZOFRAN) 4 MG tablet Take 1 tablet (4  mg total) by mouth every 8 (eight) hours as needed for nausea or vomiting. 20 tablet 0   No current facility-administered medications on file prior to visit.     Medical History:  Patient Active Problem List   Diagnosis Date Noted  . Obesity, Class I, BMI 30-34.9 05/22/2016    Priority: High  . Sleeping difficulties 01/17/2018    Priority: Medium  . Vitamin D insufficiency 05/22/2016    Priority: Medium  . Low serum HDL 05/22/2016    Priority: Medium  . Attention and concentration deficit 08/29/2017    Priority: Low  . Migraine headache 03/01/2016    Priority: Low  . Environmental and seasonal allergies 03/01/2016    Priority: Low  . Tachycardia 06/09/2018  . Sinus headache 06/09/2018  . Physical deconditioning 06/09/2018  . Dietary counseling 06/09/2018  . Exercise counseling 06/09/2018  . Contact dermatitis and eczema-bilateral axilla 10/11/2017  . Carbuncle and furuncle of leg 03/08/2017  . Acute left otitis media 03/03/2016  . Otitis, externa, infective 03/03/2016  . Pharyngitis 03/03/2016  . Fever, unspecified 03/03/2016  . History of ADHD 03/01/2016  . Viral URI 03/01/2016  . h/o Alopecia areata 05/22/2014    Allergies:  Allergies  Allergen Reactions  . Rizatriptan Swelling    Mouth swelling     Family history-  Reviewed; changed as appropriate  Social history-  Reviewed; changed as appropriate

## 2018-08-07 NOTE — Patient Instructions (Signed)
If you have insomnia or difficulty sleeping, this information is for you:  - Avoid caffeinated beverages after lunch,  no alcoholic beverages,  no eating within 2-3 hours of lying down,  avoid exposure to blue light before bed,  avoid daytime naps, and  needs to maintain a regular sleep schedule- go to sleep and wake up around the same time every night.   - Resolve concerns or worries before entering bedroom:  Discussed relaxation techniques with patient and to keep a journal to write down fears\ worries.  I suggested seeing a counselor for CBT.   - Recommend patient meditate or do deep breathing exercises to help relax.   Incorporate the use of white noise machines or listen to "sleep meditation music", or recordings of guided meditations for sleep from YouTube which are free, such as  "guided meditation for detachment from over thinking"  by Ina KickMichael Sealey.        Behavior Modification Ideas for Weight Management  Weight management involves adopting a healthy lifestyle that includes a knowledge of nutrition and exercise, a positive attitude and the right kind of motivation. Internal motives such as better health, increased energy, self-esteem and personal control increase your chances of lifelong weight management success.  Remember to have realistic goals and think long-term success. Believe in yourself and you can do it. The following information will give you ideas to help you meet your goals.  Control Your Home Environment  Eat only while sitting down at the kitchen or dining room table. Do not eat while watching television, reading, cooking, talking on the phone, standing at the refrigerator or working on the computer. Keep tempting foods out of the house - don't buy them. Keep tempting foods out of sight. Have low-calorie foods ready to eat. Unless you are preparing a meal, stay out of the kitchen. Have healthy snacks at your disposal, such as small pieces of fruit, vegetables, canned  fruit, pretzels, low-fat string cheese and nonfat cottage cheese.  Control Your Work Environment  Do not eat at [employer] desk or keep tempting snacks at your desk. If you get hungry between meals, plan healthy snacks and bring them with you to work. During your breaks, go for a walk instead of eating. If you work around food, plan in advance the one item you will eat at mealtime. Make it inconvenient to nibble on food by chewing gum, sugarless candy or drinking water or another low-calorie beverage. Do not work through meals. Skipping meals slows down metabolism and may result in overeating at the next meal. If food is available for special occasions, either pick the healthiest item, nibble on low-fat snacks brought from home, don't have anything offered, choose one option and have a small amount, or have only a beverage.  Control Your Mealtime Environment  Serve your plate of food at the stove or kitchen counter. Do not put the serving dishes on the table. If you do put dishes on the table, remove them immediately when finished eating. Fill half of your plate with vegetables, a quarter with lean protein and a quarter with starch. Use smaller plates, bowls and glasses. A smaller portion will look large when it is in a little dish. Politely refuse second helpings. When fixing your plate, limit portions of food to one scoop/serving or less.   Daily Food Management  Replace eating with another activity that you will not associate with food. Wait 20 minutes before eating something you are craving. Drink a large glass of  water or diet soda before eating. Always have a big glass or bottle of water to drink throughout the day. Avoid high-calorie add-ons such as cream with your coffee, butter, mayonnaise and salad dressings.  Shopping: Do not shop when hungry or tired. Shop from a list and avoid buying anything that is not on your list. If you must have tempting foods, buy individual-sized  packages and try to find a lower-calorie alternative. Don't taste test in the store. Read food labels. Compare products to help you make the healthiest choices.  Preparation: Chew a piece of gum while cooking meals. Use a quarter teaspoon if you taste test your food. Try to only fix what you are going to eat, leaving yourself no chance for seconds. If you have prepared more food than you need, portion it into individual containers and freeze or refrigerate immediately. Don't snack while cooking meals.  Eating: Eat slowly. Remember it takes about 20 minutes for your stomach to send a message to your brain that it is full. Don't let fake hunger make you think you need more. The ideal way to eat is to take a bite, put your utensil down, take a sip of water, cut your next bite, take a bit, put your utensil down and so on. Do not cut your food all at one time. Cut only as needed. Take small bites and chew your food well. Stop eating for a minute or two at least once during a meal or snack. Take breaks to reflect and have conversation.  Cleanup and Leftovers: Label leftovers for a specific meal or snack. Freeze or refrigerate individual portions of leftovers. Do not clean up if you are still hungry.  Eating Out and Social Eating  Do not arrive hungry. Eat something light before the meal. Try to fill up on low-calorie foods, such as vegetables and fruit, and eat smaller portions of the high-calorie foods. Eat foods that you like, but choose small portions. If you want seconds, wait at least 20 minutes after you have eaten to see if you are actually hungry or if your eyes are bigger than your stomach. Limit alcoholic beverages. Try a soda water with a twist of lime. Do not skip other meals in the day to save room for the special event.  At Restaurants: Order  la carte rather than buffet style. Order some vegetables or a salad for an appetizer instead of eating bread. If you order a  high-calorie dish, share it with someone. Try an after-dinner mint with your coffee. If you do have dessert, share it with two or more people. Don't overeat because you do not want to waste food. Ask for a doggie bag to take extra food home. Tell the server to put half of your entree in a to go bag before the meal is served to you. Ask for salad dressing, gravy or high-fat sauces on the side. Dip the tip of your fork in the dressing before each bite. If bread is served, ask for only one piece. Try it plain without butter or oil. At [employer] where oil and vinegar is served with bread, use only a small amount of oil and a lot of vinegar for dipping.  At a Friend's House: Offer to bring a dish, appetizer or dessert that is low in calories. Serve yourself small portions or tell the host that you only want a small amount. Stand or sit away from the snack table. Stay away from the kitchen or stay busy  if you are near the food. Limit your alcohol intake.  At [employer] and Cafeterias: Cover most of your plate with lettuce and/or vegetables. Use a salad plate instead of a dinner plate. After eating, clear away your dishes before having coffee or tea.  Entertaining at Home: Explore low-fat, low-cholesterol cookbooks. Use single-serving foods like chicken breasts or hamburger patties. Prepare low-calorie appetizers and desserts.   Holidays: Keep tempting foods out of sight. Decorate the house without using food. Have low-calorie beverages and foods on hand for guests. Allow yourself one planned treat a day. Don't skip meals to save up for the holiday feast. Eat regular, planned meals.   Exercise Well  Make exercise a priority and a planned activity in the day. If possible, walk the entire or part of the distance to work. Get an exercise buddy. Go for a walk with a colleague during one of your breaks, go to the gym, run or take a walk with a friend, walk in the mall with a shopping  companion. Park at the end of the parking lot and walk to the store or office entrance. Always take the stairs all of the way or at least part of the way to your floor. If you have a desk job, walk around the office frequently. Do leg lifts while sitting at your desk. Do something outside on the weekends like going for a hike or a bike ride.   Have a Healthy Attitude  Make health your weight management priority. Be realistic. Have a goal to achieve a healthier you, not necessarily the lowest weight or ideal weight based on calculations or tables. Focus on a healthy eating style, not on dieting. Dieting usually lasts for a short amount of time and rarely produces long-term success. Think long term. You are developing new healthy behaviors to follow next month, in a year and in a decade.    This information is for educational purposes only and is not intended to replace the advice of your doctor or health care provider. We encourage you to discuss with your doctor any questions or concerns you may have.        Guidelines for Losing Weight   We want weight loss that will last so you should lose 1-2 pounds a week.  THAT IS IT! Please pick THREE things a month to change. Once it is a habit check off the item. Then pick another three items off the list to become habits.  If you are already doing a habit on the list GREAT!  Cross that item off!  Don't drink your calories. Ie, alcohol, soda, fruit juice, and sweet tea.   Drink more water. Drink a glass when you feel hungry or before each meal.   Eat breakfast - Complex carb and protein (likeDannon light and fit yogurt, oatmeal, fruit, eggs, [place] bacon).  Measure your cereal.  Eat no more than one cup a day. (ie Kashi)  Eat an apple a day.  Add a vegetable a day.  Try a new vegetable a month.  Use Pam! Stop using oil or butter to cook.  Don't finish your plate or use smaller plates.  Share your dessert.  Eat sugar free Jello  for dessert or frozen grapes.  Don't eat 2-3 hours before bed.  Switch to whole wheat bread, pasta, and brown rice.  Make healthier choices when you eat out. No fries!  Pick baked chicken, NOT fried.  Don't forget to SLOW DOWN when you eat. It is  not going anywhere.   Take the stairs.  Park far away in the parking lot  Lift soup cans (or weights) for 10 minutes while watching TV.  Walk at work for 10 minutes during break.  Walk outside 1 time a week with your friend, kids, dog, or significant other.  Start a walking group at church.  Walk the mall as much as you can tolerate.   Keep a food diary.  Weigh yourself daily.  Walk for 15 minutes 3 days per week.  Cook at home more often and eat out less. If life happens and you go back to old habits, it is okay.  Just start over. You can do it!  If you experience chest pain, get short of breath, or tired during the exercise, please stop immediately and inform your doctor.    Before you even begin to attack a weight-loss plan, it pays to remember this: You are not fat. You have fat. Losing weight isn't about blame or shame; it's simply another achievement to accomplish. Dieting is like any other skill-you have to buckle down and work at it. As long as you act in a smart, reasonable way, you'll ultimately get where you want to be. Here are some weight loss pearls for you.   1. It's Not a Diet. It's a Lifestyle Thinking of a diet as something you're on and suffering through only for the short term doesn't work. To shed weight and keep it off, you need to make permanent changes to the way you eat. It's OK to indulge occasionally, of course, but if you cut calories temporarily and then revert to your old way of eating, you'll gain back the weight quicker than you can say yo-yo. Use it to lose it. Research shows that one of the best predictors of long-term weight loss is how many pounds you drop in the first month. For that reason,  nutritionists often suggest being stricter for the first two weeks of your new eating strategy to build momentum. Cut out added sugar and alcohol and avoid unrefined carbs. After that, figure out how you can reincorporate them in a way that's healthy and maintainable.  2. There's a Right Way to Exercise Working out burns calories and fat and boosts your metabolism by building muscle. But those trying to lose weight are notorious for overestimating the number of calories they burn and underestimating the amount they take in. Unfortunately, your system is biologically programmed to hold on to extra pounds and that means when you start exercising, your body senses the deficit and ramps up its hunger signals. If you're not diligent, you'll eat everything you burn and then some. Use it, to lose it. Cardio gets all the exercise glory, but strength and interval training are the real heroes. They help you build lean muscle, which in turn increases your metabolism and calorie-burning ability 3. Don't Overreact to Mild Hunger Some people have a hard time losing weight because of hunger anxiety. To them, being hungry is bad-something to be avoided at all costs-so they carry snacks with them and eat when they don't need to. Others eat because they're stressed out or bored. While you never want to get to the point of being ravenous (that's when bingeing is likely to happen), a hunger pang, a craving, or the fact that it's 3:00 p.m. should not send you racing for the vending machine or obsessing about the energy bar in your purse. Ideally, you should put off eating until your  stomach is growling and it's difficult to concentrate.  Use it to lose it. When you feel the urge to eat, use the HALT method. Ask yourself, Am I really hungry? Or am I angry or anxious, lonely or bored, or tired? If you're still not certain, try the apple test. If you're truly hungry, an apple should seem delicious; if it doesn't, something else is  going on. Or you can try drinking water and making yourself busy, if you are still hungry try a healthy snack.  4. Not All Calories Are Created Equal The mechanics of weight loss are pretty simple: Take in fewer calories than you use for energy. But the kind of food you eat makes all the difference. Processed food that's high in saturated fat and refined starch or sugar can cause inflammation that disrupts the hormone signals that tell your brain you're full. The result: You eat a lot more.  Use it to lose it. Clean up your diet. Swap in whole, unprocessed foods, including vegetables, lean protein, and healthy fats that will fill you up and give you the biggest nutritional bang for your calorie buck. In a few weeks, as your brain starts receiving regular hunger and fullness signals once again, you'll notice that you feel less hungry overall and naturally start cutting back on the amount you eat.  5. Protein, Produce, and Plant-Based Fats Are Your Weight-Loss Trinity Here's why eating the three Ps regularly will help you drop pounds. Protein fills you up. You need it to build lean muscle, which keeps your metabolism humming so that you can torch more fat. People in a weight-loss program who ate double the recommended daily allowance for protein (about 110 grams for a 150-pound woman) lost 70 percent of their weight from fat, while people who ate the RDA lost only about 40 percent, one study found. Produce is packed with filling fiber. "It's very difficult to consume too many calories if you're eating a lot of vegetables. Example: Three cups of broccoli is a lot of food, yet only 93 calories. (Fruit is another story. It can be easy to overeat and can contain a lot of calories from sugar, so be sure to monitor your intake.) Plant-based fats like olive oil and those in avocados and nuts are healthy and extra satiating.  Use it to lose it. Aim to incorporate each of the three Ps into every meal and snack.  People who eat protein throughout the day are able to keep weight off, according to a study in the American Journal of Clinical Nutrition. In addition to meat, poultry and seafood, good sources are beans, lentils, eggs, tofu, and yogurt. As for fat, keep portion sizes in check by measuring out salad dressing, oil, and nut butters (shoot for one to two tablespoons). Finally, eat veggies or a little fruit at every meal. People who did that consumed 308 fewer calories but didn't feel any hungrier than when they didn't eat more produce.  7. How You Eat Is As Important As What You Eat In order for your brain to register that you're full, you need to focus on what you're eating. Sit down whenever you eat, preferably at a table. Turn off the TV or computer, put down your phone, and look at your food. Smell it. Chew slowly, and don't put another bite on your fork until you swallow. When women ate lunch this attentively, they consumed 30 percent less when snacking later than those who listened to an audiobook at lunchtime,  according to a study in the [place] Journal of Nutrition. 8. Weighing Yourself Really Works The scale provides the best evidence about whether your efforts are paying off. Seeing the numbers tick up or down or stagnate is motivation to keep going-or to rethink your approach. A 2015 study at [facility] found that daily weigh-ins helped people lose more weight, keep it off, and maintain that loss, even after two years. Use it to lose it. Step on the scale at the same time every day for the best results. If your weight shoots up several pounds from one weigh-in to the next, don't freak out. Eating a lot of salt the night before or having your period is the likely culprit. The number should return to normal in a day or two. It's a steady climb that you need to do something about. 9. Too Much Stress and Too Little Sleep Are Your Enemies When you're tired and frazzled, your body cranks up the  production of cortisol, the stress hormone that can cause carb cravings. Not getting enough sleep also boosts your levels of ghrelin, a hormone associated with hunger, while suppressing leptin, a hormone that signals fullness and satiety. People on a diet who slept only five and a half hours a night for two weeks lost 55 percent less fat and were hungrier than those who slept eight and a half hours, according to a study in the [place] [employer] Journal. Use it to lose it. Prioritize sleep, aiming for seven hours or more a night, which research shows helps lower stress. And make sure you're getting quality zzz's. If a snoring spouse or a fidgety cat wakes you up frequently throughout the night, you may end up getting the equivalent of just four hours of sleep, according to a study from [facility]. Keep pets out of the bedroom, and use a white-noise app to drown out snoring. 10. You Will Hit a plateau-And You Can Bust Through It As you slim down, your body releases much less leptin, the fullness hormone.  If you're not strength training, start right now. Building muscle can raise your metabolism to help you overcome a plateau. To keep your body challenged and burning calories, incorporate new moves and more intense intervals into your workouts or add another sweat session to your weekly routine. Alternatively, cut an extra 100 calories or so a day from your diet. Now that you've lost weight, your body simply doesn't need as much fuel.    Since food equals calories, in order to lose weight you must either eat fewer calories, exercise more to burn off calories with activity, or both. Food that is not used to fuel the body is stored as fat. A major component of losing weight is to make smarter food choices. Here's how:  1)   Limit non-nutritious foods, such as: Sugar, honey, syrups and candy Pastries, donuts, pies, cakes and cookies Soft drinks, sweetened juices and alcoholic  beverages  2)  Cut down on high-fat foods by: - Choosing poultry, fish or lean red meat - Choosing low-fat cooking methods, such as baking, broiling, steaming, grilling and boiling - Using low-fat or non-fat dairy products - Using vinaigrette, herbs, lemon or fat-free salad dressings - Avoiding fatty meats, such as bacon, sausage, franks, ribs and luncheon meats - Avoiding high-fat snacks like nuts, chips and chocolate - Avoiding fried foods - Using less butter, margarine, oil and mayonnaise - Avoiding high-fat gravies, cream sauces and cream-based soups  3) Eat a  variety of foods, including: - Fruit and vegetables that are raw, steamed or baked - Whole grains, breads, cereal, rice and pasta - Dairy products, such as low-fat or non-fat milk or yogurt, low-fat cottage cheese and low-fat cheese - Protein-rich foods like chicken, [place], fish, lean meat and legumes, or beans  4) Change your eating habits by: - Eat three balanced meals a day to help control your hunger - Watch portion sizes and eat small servings of a variety of foods - Choose low-calorie snacks - Eat only when you are hungry and stop when you are satisfied - Eat slowly and try not to perform other tasks while eating - Find other activities to distract you from food, such as walking, taking up a hobby or being involved in the community - Include regular exercise in your daily routine ( minimum of 20 min of moderate-intensity exercise at least 5 days/week)  - Find a support group, if necessary, for emotional support in your weight loss journey           Easy ways to cut 100 calories   1. Eat your eggs with hot sauce OR salsa instead of cheese.  Eggs are great for breakfast, but many people consider eggs and cheese to be BFFs. Instead of cheese-1 oz. of cheddar has 114 calories-top your eggs with hot sauce, which contains no calories and helps with satiety and metabolism. Salsa is also a great option!!  2. Top  your toast, waffles or pancakes with fresh berries instead of jelly or syrup. Half a cup of berries-fresh, frozen or thawed-has about 40 calories, compared with 2 tbsp. of maple syrup or jelly, which both have about 100 calories. The berries will also give you a good punch of fiber, which helps keep you full and satisfied and won't spike blood sugar quickly like the jelly or syrup. 3. Swap the non-fat latte for black coffee with a splash of half-and-half. Contrary to its name, that non-fat latte has 130 calories and a startling 19g of carbohydrates per 16 oz. serving. Replacing that 'light' drinkable dessert with a black coffee with a splash of half-and-half saves you more than 100 calories per 16 oz. serving. 4. Sprinkle salads with freeze-dried raspberries instead of dried cranberries. If you want a sweet addition to your nutritious salad, stay away from dried cranberries. They have a whopping 130 calories per  cup and 30g carbohydrates. Instead, sprinkle freeze-dried raspberries guilt-free and save more than 100 calories per  cup serving, adding 3g of belly-filling fiber. 5. Go for mustard in place of mayo on your sandwich. Mustard can add really nice flavor to any sandwich, and there are tons of varieties, from spicy to honey. A serving of mayo is 95 calories, versus 10 calories in a serving of mustard.  Or try an avocado mayo spread: You can find the recipe few click this link: https://www.californiaavocado.com/recipes/recipe-container/california-avocado-mayo 6. Choose a DIY salad dressing instead of the store-bought kind. Mix Dijon or whole grain mustard with low-fat Kefir or red wine vinegar and garlic. 7. Use hummus as a spread instead of a dip. Use hummus as a spread on a high-fiber cracker or tortilla with a sandwich and save on calories without sacrificing taste. 8. Pick just one salad "accessory." Salad isn't automatically a calorie winner. It's easy to over-accessorize with toppings.  Instead of topping your salad with nuts, avocado and cranberries (all three will clock in at 313 calories), just pick one. The next day, choose a different accessory, which  will also keep your salad interesting. You don't wear all your jewelry every day, right? 9. Ditch the white pasta in favor of spaghetti squash. One cup of cooked spaghetti squash has about 40 calories, compared with traditional spaghetti, which comes with more than 200. Spaghetti squash is also nutrient-dense. It's a good source of fiber and Vitamins A and C, and it can be eaten just like you would eat pasta-with a great tomato sauce and [place] meatballs or with pesto, tofu and spinach, for example. 10. Dress up your chili, soups and stews with non-fat [place] yogurt instead of sour cream. Just a 'dollop' of sour cream can set you back 115 calories and a whopping 12g of fat-seven of which are of the artery-clogging variety. Added bonus: [place] yogurt is packed with muscle-building protein, calcium and B Vitamins. 11. Mash cauliflower instead of mashed potatoes. One cup of traditional mashed potatoes-in all their creamy goodness-has more than 200 calories, compared to mashed cauliflower, which you can typically eat for less than 100 calories per 1 cup serving. Cauliflower is a great source of the antioxidant indole-3-carbinol (I3C), which may help reduce the risk of some cancers, like breast cancer. 12. Ditch the ice cream sundae in favor of a [place] yogurt parfait. Instead of a cup of ice cream or fro-yo for dessert, try 1 cup of nonfat Greek yogurt topped with fresh berries and a sprinkle of cacao nibs. Both toppings are packed with antioxidants, which can help reduce cellular inflammation and oxidative damage. And the comparison is a no-brainer: One cup of ice cream has about 275 calories; one cup of frozen yogurt has about 230; and a cup of Greek yogurt has just 130, plus twice the protein, so you're less likely to return to the freezer  for a second helping. 13. Put olive oil in a spray container instead of using it directly from the bottle. Each tablespoon of olive oil is 120 calories and 15g of fat. Use a mister instead of pouring it straight into the pan or onto a salad. This allows for portion control and will save you more than 100 calories. 14. When baking, substitute canned pumpkin for butter or oil. Canned pumpkin-not pumpkin pie mix-is loaded with Vitamin A, which is important for skin and eye health, as well as immunity. And the comparisons are pretty crazy:  cup of canned pumpkin has about 40 calories, compared to butter or oil, which has more than 800 calories. Yes, 800 calories. Applesauce and mashed banana can also serve as good substitutions for butter or oil, usually in a 1:1 ratio. 15. Top casseroles with high-fiber cereal instead of breadcrumbs. Breadcrumbs are typically made with white bread, while breakfast cereals contain 5-9g of fiber per serving. Not only will you save more than 150 calories per  cup serving, the swap will also keep you more full and you'll get a metabolism boost from the added fiber. 16. Snack on pistachios instead of macadamia nuts. Believe it or not, you get the same amount of calories from 35 pistachios (100 calories) as you would from only five macadamia nuts. 17. Chow down on kale chips rather than potato chips. This is my favorite 'don't knock it 'till you try it' swap. Kale chips are so easy to make at home, and you can spice them up with a little grated parmesan or chili powder. Plus, they're a mere fraction of the calories of potato chips, but with the same crunch factor we crave so often. 18. Add seltzer  and some fruit slices to your cocktail instead of soda or fruit juice. One cup of soda or fruit juice can pack on as much as 140 calories. Instead, use seltzer and fruit slices. The fruit provides valuable phytochemicals, such as flavonoids and anthocyanins, which help to combat  cancer and stave off the aging process.

## 2019-04-24 MED FILL — TRIAMCINOLONE 0.1% OINTMENT: 0.1 | 10 days supply | Qty: 15 | Fill #0

## 2019-05-12 ENCOUNTER — Other Ambulatory Visit: Payer: Self-pay

## 2019-05-12 ENCOUNTER — Encounter (HOSPITAL_BASED_OUTPATIENT_CLINIC_OR_DEPARTMENT_OTHER): Payer: Self-pay | Admitting: *Deleted

## 2019-05-12 ENCOUNTER — Emergency Department (HOSPITAL_BASED_OUTPATIENT_CLINIC_OR_DEPARTMENT_OTHER): Payer: PRIVATE HEALTH INSURANCE

## 2019-05-12 ENCOUNTER — Emergency Department (HOSPITAL_BASED_OUTPATIENT_CLINIC_OR_DEPARTMENT_OTHER)
Admission: EM | Admit: 2019-05-12 | Discharge: 2019-05-12 | Disposition: A | Discharge status: Home / Self Care | Payer: PRIVATE HEALTH INSURANCE | Attending: Emergency Medicine | Admitting: Emergency Medicine

## 2019-05-12 DIAGNOSIS — Z888 Allergy status to other drugs, medicaments and biological substances status: Secondary | ICD-10-CM | POA: Insufficient documentation

## 2019-05-12 DIAGNOSIS — Z79899 Other long term (current) drug therapy: Secondary | ICD-10-CM | POA: Diagnosis not present

## 2019-05-12 DIAGNOSIS — Z87891 Personal history of nicotine dependence: Secondary | ICD-10-CM | POA: Insufficient documentation

## 2019-05-12 DIAGNOSIS — S99911A Unspecified injury of right ankle, initial encounter: Secondary | ICD-10-CM

## 2019-05-12 DIAGNOSIS — M25571 Pain in right ankle and joints of right foot: Secondary | ICD-10-CM | POA: Diagnosis not present

## 2019-05-12 MED ORDER — NAPROXEN 500 MG PO TABS: 500.0000 mg | ORAL_TABLET | Freq: Two times a day (BID) | ORAL | 0 refills | Status: DC

## 2019-05-12 MED FILL — NAPROXEN 500 MG TABS: 500 | 5 days supply | Qty: 10 | Fill #0

## 2019-05-12 NOTE — Discharge Instructions (Signed)
Please read and follow all provided instructions.  You have been seen today for a right ankle injury  Tests performed today include: An x-ray of the affected area - does NOT show any broken bones or dislocations.  Vital signs. See below for your results today.   Home care instructions: -- *PRICE in the first 24-48 hours after injury: Protect (with brace, splint, sling), if given by your provider Rest Ice- Do not apply ice pack directly to your skin, place towel or similar between your skin and ice/ice pack. Apply ice for 20 min, then remove for 40 min while awake Compression- Wear brace, elastic bandage, splint as directed by your provider Elevate affected extremity above the level of your heart when not walking around for the first 24-48 hours    Use crutches as needed, weightbear as tolerated.   Medications:  - Naproxen is a nonsteroidal anti-inflammatory medication that will help with pain and swelling. Be sure to take this medication as prescribed with food, 1 pill every 12 hours,  It should be taken with food, as it can cause stomach upset, and more seriously, stomach bleeding. Do not take other nonsteroidal anti-inflammatory medications with this such as Advil, Motrin, Aleve, Mobic, Goodie Powder, or Motrin.    You make take Tylenol per over the counter dosing with these medications.   We have prescribed you new medication(s) today. Discuss the medications prescribed today with your pharmacist as they can have adverse effects and interactions with your other medicines including over the counter and prescribed medications. Seek medical evaluation if you start to experience new or abnormal symptoms after taking one of these medicines, seek care immediately if you start to experience difficulty breathing, feeling of your throat closing, facial swelling, or rash as these could be indications of a more serious allergic reaction   Follow-up instructions: Please follow-up with your primary  care provider or the provided orthopedic physician (bone specialist) if you continue to have significant pain in 1 week. In this case you may have a more severe injury that requires further care.   Return instructions:  Please return if your digits or extremity are numb or tingling, appear gray or blue, or you have severe pain (also elevate the extremity and loosen splint or wrap if you were given one) Please return if you have redness or fevers.  Please return to the Emergency Department if you experience worsening symptoms.  Please return if you have any other emergent concerns. Additional Information:  Your vital signs today were: BP 116/75    Pulse 98    Temp 98.1 F (36.7 C) (Oral)    Resp 18    Ht 5\' 3"  (1.6 m)    Wt 90.7 kg    LMP 04/28/2019    SpO2 99%    BMI 35.43 kg/m  If your blood pressure (BP) was elevated above 135/85 this visit, please have this repeated by your doctor within one month. ---------------

## 2019-05-12 NOTE — ED Triage Notes (Signed)
Right ankle injury. Today she twisted her ankle. Workmans comp.

## 2019-05-12 NOTE — ED Provider Notes (Signed)
[facility] EMERGENCY DEPARTMENT Provider Note   CSN: [id number] Arrival date & time: 05/12/19  1203     History   Chief Complaint Chief Complaint  Patient presents with  . Ankle Injury    HPI Stefanie White is a 28 y.o. female with a history of migraines, obesity, and former tobacco use who presents to the emergency department with complaints of right ankle injury which occurred today.  Patient states that she was walking down the stairs fairly quickly when she inverted the right ankle resulting in a fall.  Denies head injury or loss of consciousness.  She reports isolated injury to the ankle.  She has not bared weight since.  She states the pain is mostly to the lateral aspect, worse with movement, no alleviating factors.  Denies numbness, tingling, weakness, or open wounds.  Denies chance of pregnancy.     HPI  Past Medical History:  Diagnosis Date  . Alopecia     Patient Active Problem List   Diagnosis Date Noted  . Tachycardia 06/09/2018  . Sinus headache 06/09/2018  . Physical deconditioning 06/09/2018  . Dietary counseling 06/09/2018  . Exercise counseling 06/09/2018  . Sleeping difficulties 01/17/2018  . Contact dermatitis and eczema-bilateral axilla 10/11/2017  . Attention and concentration deficit 08/29/2017  . Carbuncle and furuncle of leg 03/08/2017  . Vitamin D insufficiency 05/22/2016  . Obesity, Class I, BMI 30-34.9 05/22/2016  . Low serum HDL 05/22/2016  . Acute left otitis media 03/03/2016  . Otitis, externa, infective 03/03/2016  . Pharyngitis 03/03/2016  . Fever, unspecified 03/03/2016  . Migraine headache 03/01/2016  . History of ADHD 03/01/2016  . Environmental and seasonal allergies 03/01/2016  . Viral URI 03/01/2016  . h/o Alopecia areata 05/22/2014    History reviewed. No pertinent surgical history.   OB History   No obstetric history on file.      Home Medications    Prior to Admission medications   Medication Sig  Start Date End Date Taking? Authorizing Provider  amphetamine-dextroamphetamine (ADDERALL XR) 10 MG 24 hr capsule Take 1 capsule (10 mg total) by mouth daily. 06/07/18   Thomasene Lot, DO  aspirin-acetaminophen-caffeine (EXCEDRIN MIGRAINE) [phone] MG tablet Take 1-2 tablets by mouth every 6 (six) hours as needed for headache.    [provider]  Cholecalciferol (VITAMIN D) 2000 units CAPS Take 1 capsule by mouth daily.    [provider]  fexofenadine-pseudoephedrine (ALLEGRA-D ALLERGY & CONGESTION) 180-240 MG 24 hr tablet Take 1 tablet by mouth daily. 03/01/16   Opalski, Deborah, DO  fluticasone (FLONASE) 50 MCG/ACT nasal spray Place 1 spray into both nostrils 2 (two) times daily. 03/01/16   Thomasene Lot, DO  Multiple Vitamin (MULTIVITAMIN) capsule Take 1 capsule by mouth daily.    [provider]  Norethindrone Acet-Ethinyl Est (LARIN 1/20 PO) Take 1 tablet by mouth daily.    [provider]  ondansetron (ZOFRAN) 4 MG tablet Take 1 tablet (4 mg total) by mouth every 8 (eight) hours as needed for nausea or vomiting. 03/26/18   Thomasene Lot, DO    Family History Family History  Problem Relation Age of Onset  . Diabetes Father 47  . Hypertension Father 6  . Depression Brother 10       bipolar  . Cancer Maternal Aunt 45       colon  . Heart attack Paternal Grandmother 59    Social History Social History   Tobacco Use  . Smoking status: Former Smoker  Packs/day: 0.25    Years: 1.00    Pack years: 0.25    Types: Cigarettes    Quit date: 06/12/2010    Years since quitting: 8.9  . Smokeless tobacco: Never Used  Substance Use Topics  . Alcohol use: Yes    Alcohol/week: 1.0 standard drinks    Types: 1 Glasses of wine per week  . Drug use: No     Allergies   Rizatriptan   Review of Systems Review of Systems  Constitutional: Negative for chills and fever.  Respiratory: Negative for shortness of breath.   Cardiovascular:  Negative for chest pain.  Gastrointestinal: Negative for abdominal pain.  Musculoskeletal: Positive for arthralgias. Negative for back pain and neck pain.  Skin: Negative for wound.  Neurological: Negative for weakness and numbness.       Negative for paresthesias.     Physical Exam Updated Vital Signs BP 116/75   Pulse 98   Temp 98.1 F (36.7 C) (Oral)   Resp 18   Ht 5\' 3"  (1.6 m)   Wt 90.7 kg   LMP 04/28/2019   SpO2 99%   BMI 35.43 kg/m   Physical Exam Vitals signs and nursing note reviewed.  Constitutional:      General: She is not in acute distress.    Appearance: She is well-developed. She is not ill-appearing or toxic-appearing.  HENT:     Head: Normocephalic and atraumatic.     Comments: No raccoon eyes or battle sign. Eyes:     General:        Right eye: No discharge.        Left eye: No discharge.     Conjunctiva/sclera: Conjunctivae normal.  Neck:     Musculoskeletal: Neck supple.  Cardiovascular:     Rate and Rhythm: Normal rate and regular rhythm.     Pulses:          Dorsalis pedis pulses are 2+ on the right side and 2+ on the left side.       Posterior tibial pulses are 2+ on the right side and 2+ on the left side.  Pulmonary:     Effort: Pulmonary effort is normal. No respiratory distress.     Breath sounds: Normal breath sounds. No wheezing, rhonchi or rales.  Chest:     Chest wall: No tenderness.  Abdominal:     General: There is no distension.     Palpations: Abdomen is soft.     Tenderness: There is no abdominal tenderness.  Musculoskeletal:     Comments: Lower extremities: No obvious deformity, appreciable swelling, edema, erythema, ecchymosis, warmth, or open wounds. Patient has intact AROM to bilateral hips, knees, ankles, and all digits, she does have some discomfort with right ankle plantar/dorsiflexion.. Tender to palpation to the right lateral malleolus and right lateral ankle ligaments.  Lower extremities are otherwise nontender,  specifically no tenderness to the fibular head, base of the fifth metatarsal, or navicular bone. Back: No midline tenderness Upper extremities intact active range of motion without point/focal bony tenderness.  Skin:    General: Skin is warm and dry.     Capillary Refill: Capillary refill takes less than 2 seconds.     Findings: No rash.  Neurological:     Mental Status: She is alert.     Comments: Alert. Clear speech. Sensation grossly intact to bilateral lower extremities. 5/5 strength with plantar/dorsiflexion bilaterally.  Psychiatric:        Mood and Affect: Mood normal.  Behavior: Behavior normal.      ED Treatments / Results  Labs (all labs ordered are listed, but only abnormal results are displayed) Labs Reviewed - No data to display  EKG None  Radiology Dg Ankle Complete Right  Result Date: 05/12/2019 CLINICAL DATA:  Twisted ankle today.  Ankle pain EXAM: RIGHT ANKLE - COMPLETE 3+ VIEW COMPARISON:  None. FINDINGS: There is no evidence of fracture, dislocation, or joint effusion. There is no evidence of arthropathy or other focal bone abnormality. Soft tissues are unremarkable. Mild calcaneal spurring IMPRESSION: No acute abnormality Electronically Signed   By: Marlan Palauharles  Clark M.D.   On: 05/12/2019 12:49    Procedures Procedures (including critical care time) SPLINT APPLICATION Date/Time: 3:11 PM Authorized by: Harvie HeckSamantha Vineeth Fell Consent: Verbal consent obtained. Risks and benefits: risks, benefits and alternatives were discussed Consent given by: patient Splint applied by: ED technician Location details: RLE Splint type: ASO Supplies used: ASO Post-procedure: The splinted body part was neurovascularly unchanged following the procedure. Patient tolerance: Patient tolerated the procedure well with no immediate complications.    Medications Ordered in ED Medications - No data to display   Initial Impression / Assessment and Plan / ED Course  I have  reviewed the triage vital signs and the nursing notes.  Pertinent labs & imaging results that were available during my care of the patient were reviewed by me and considered in my medical decision making (see chart for details).    Patient presents to the ED with complaints of pain to the R ankle s/p injury inversion with fall. Exam without obvious deformity or open wounds. ROM intact. Tender to palpation to lateral malleolus and lateral ligaments. NVI distally. Xray negative for fracture/dislocation. Therapeutic splint & crutches provided. PRICE  recommended. Naproxen prescription. I discussed results, treatment plan, need for follow-up, and return precautions with the patient. Provided opportunity for questions, patient confirmed understanding and are in agreement with plan.    Final Clinical Impressions(s) / ED Diagnoses   Final diagnoses:  Injury of right ankle, initial encounter    ED Discharge Orders         Ordered    naproxen (NAPROSYN) 500 MG tablet  2 times daily     05/12/19 1513           Cherly Andersonetrucelli, Breasia Karges R, PA-C 05/12/19 1514    Terald Sleeperrifan, Matthew J, MD 05/12/19 1806

## 2019-05-15 ENCOUNTER — Ambulatory Visit: Payer: No Typology Code available for payment source | Admitting: Family Medicine

## 2019-07-22 ENCOUNTER — Telehealth: Payer: Self-pay | Admitting: Family Medicine

## 2019-07-25 ENCOUNTER — Ambulatory Visit (INDEPENDENT_AMBULATORY_CARE_PROVIDER_SITE_OTHER): Payer: No Typology Code available for payment source | Admitting: Family Medicine

## 2019-07-25 ENCOUNTER — Other Ambulatory Visit: Payer: Self-pay

## 2019-07-25 ENCOUNTER — Encounter: Payer: Self-pay | Admitting: Family Medicine

## 2019-07-25 VITALS — BP 112/75 | HR 77 | Temp 97.4°F | Ht 63.25 in | Wt 207.1 lb

## 2019-07-25 DIAGNOSIS — Z719 Counseling, unspecified: Secondary | ICD-10-CM | POA: Diagnosis not present

## 2019-07-25 DIAGNOSIS — L659 Nonscarring hair loss, unspecified: Secondary | ICD-10-CM | POA: Insufficient documentation

## 2019-07-25 DIAGNOSIS — Z0000 Encounter for general adult medical examination without abnormal findings: Secondary | ICD-10-CM

## 2019-07-25 DIAGNOSIS — E559 Vitamin D deficiency, unspecified: Secondary | ICD-10-CM

## 2019-07-25 DIAGNOSIS — E669 Obesity, unspecified: Secondary | ICD-10-CM

## 2019-07-25 DIAGNOSIS — F430 Acute stress reaction: Secondary | ICD-10-CM | POA: Insufficient documentation

## 2019-07-25 DIAGNOSIS — R0789 Other chest pain: Secondary | ICD-10-CM | POA: Insufficient documentation

## 2019-07-25 DIAGNOSIS — Z566 Other physical and mental strain related to work: Secondary | ICD-10-CM | POA: Diagnosis not present

## 2019-07-25 DIAGNOSIS — E786 Lipoprotein deficiency: Secondary | ICD-10-CM

## 2019-07-25 DIAGNOSIS — E781 Pure hyperglyceridemia: Secondary | ICD-10-CM | POA: Insufficient documentation

## 2019-07-25 NOTE — Progress Notes (Signed)
Female Physical  Impression and Recommendations:    1. Encounter for wellness examination   2. Obesity, Class II, BMI 35-39.9   3. Health education/counseling   4. Stress at work   5. Stress reaction   6. Sensation of chest tightness   7. Low HDL (under 40)   8. High triglycerides   9. Vitamin D deficiency   10. Alopecia      1) Anticipatory Guidance: Discussed importance of wearing a seatbelt while driving, not texting while driving; sunscreen when outside along with yearly skin surveillance; eating a well balanced and modest diet; physical activity at least 25 minutes per day or 150 min/ week of moderate to intense activity.  2) Immunizations / Screenings / Labs:   All immunizations and screenings that patient agrees to, are up-to-date per recommendations or will be updated today.  Patient understands the needs for q 25mo dental and yearly vision screens which pt will schedule independently. Obtain CBC, CMP, HgA1c, Lipid panel, TSH and vit D when fasting if not already done recently.   - Need for full fasting lab work today.  - Last pap smear obtained this past year 2020.  Last on record, 12/20/2017 was normal with negative HPV.  - Follows up with Dr. Waynard Reeds of OBGYN.   3) Weight - Body mass index is 36.4 kg/m: Discussed goal of losing even 5-10% of current body weight which would improve overall feelings of well being and improve objective health data significantly.   Improve nutrient density of diet through increasing intake of fruits and vegetables and decreasing saturated/trans fats, white flour products and refined sugar products.   4) Health Counseling & Preventative Maintenance: - Advised patient to continue working toward exercising to improve overall mental, physical, and emotional health.    - Encouraged patient to engage in daily physical activity as tolerated, especially a formal exercise routine.  Recommended that the patient eventually strive for at  least 150 minutes of moderate cardiovascular activity per week according to guidelines established by the [facility].   - Healthy dietary habits encouraged, including low-carb, and high amounts of lean protein in diet.   - Patient should also consume adequate amounts of water.  - Health counseling performed.  All questions answered.  5) Stress at Work, Stress Reaction, Sensation of Chest Tightness - Discussed patient's symptoms extensively during appointment today. - Discussed the difference between stress reaction and cardiac red flags. - Education provided and all questions answered.  - Will hold off on beginning prescription medication for now.  - For treatment without prescription intervention, reviewed the "spokes of the wheel" of health and stress management.  Stressed the importance of ongoing prudent habits, including regular exercise, appropriate sleep hygiene, healthful dietary habits, and prayer/meditation to relax.  - Discussed the sympathetic response of fight or flight, and encouraged patient to download the Calm or Headspace app and utilize meditation / deep breathing strategies for assistance with acute stress and anxiety mitigation.  - Encouraged patient to continue reducing her intake of caffeine.  - Additionally, encouraged patient to seek therapy/counseling as available.  - Will continue to monitor. - Patient will return for follow-up in one month to assess progress with stress management.    Meds ordered this encounter  Medications  . Cholecalciferol (VITAMIN D3) 125 MCG (5000 UT) TABS    Sig: 5,000 IU OTC vitamin D3 daily.    Dispense:  90 tablet    Refill:  3    Orders Placed  This Encounter  Procedures  . Comprehensive metabolic panel  . CBC with Differential/Platelet  . Hemoglobin A1c  . Lipid panel  . VITAMIN D 25 Hydroxy (Vit-D Deficiency, Fractures)  . TSH  . T4, free     Return for f/up one month for stress management, tele-health, DOXY, or in  person per patient.   Reminded pt important of f-up preventative CPE in 1 year.  Reminded pt again, this is in addition to any chronic care visits.    Gross side effects, risk and benefits, and alternatives of medications discussed with patient.  Patient is aware that all medications have potential side effects and we are unable to predict every side effect or drug-drug interaction that may occur.  Expresses verbal understanding and consents to current therapy plan and treatment regimen.  F-up preventative CPE in 1 year- reminded pt again, this is in addition to any chronic care visits.    Please see orders placed and AVS handed out to patient at the end of our visit for further patient instructions/ counseling done pertaining to today's office visit.   This document serves as a record of services personally performed by Thomasene Lot, DO. It was created on her behalf by [name], a trained medical scribe. The creation of this record is based on the scribe's personal observations and the provider's statements to them.   This case required medical decision making of at least moderate complexity.  This case required medical decision making of at least moderate complexity. The above documentation from [name], medical scribe, has been reviewed by Carlye Grippe, D.O.        Subjective:    I, [name], am serving as Neurosurgeon for [name].  CPE HPI: JADELYNN BOYLAN is a 29 y.o. female who presents to [facility] today a yearly health maintenance exam.   Health Maintenance Summary  - Reviewed and updated, unless pt declines services.  Family history of Colon CA:  No family history.  Tobacco History Reviewed:  Y; former smoker, 0.25 ppd, for 0.25 pack-years, quit in 2012.  Alcohol and/or drug use:  No concerns; no use. Exercise Habits:  Not meeting AHA guidelines. Dental Home:  Does not go to the dentist regularly;  notes "working on that now."  She switched insurance and making appointments under her coverage. Eye exams: n/a Dermatology home:  Follows up with dermatology through [facility], but has not been back to see them in a while.  Has alopecia, and notes she has developed a new bald spot recently from stress.  She has been treated with clobetasol and scalp injections in the past.  She hasn't had a spot in so long that she hasn't received injections through dermatology in 2-3 years.  Female Health:  Follows up with Dr. Waynard Reeds of OBGYN. PAP Smear - last known results:  Last pap smear obtained this past year, with last known results in 12/20/2017: WNL, with negative HPV.  No h/o ABN STD concerns:  None, monogamous with husband. Birth control method:  Not using birth control at present; trying to have a baby with her husband.  Right now she is using an ovulation tracker.  Monitored by OBGYN. Menses regular:  No concerns; monitored by OBGYN. Lumps or breast concerns:  None reported. Breast Cancer Family History:  No.  Additional concerns beyond health maintenance issues:   - Increased Stress during COVID-19 Thinks she's been managing her stress poorly.    Notes  she works at the [employer], and things over there are not easy.  Says "these patients take a certain type of therapy, and the therapy is always changing, so it's been a learning curve."    Over the past year, she's noticed that sometimes if she gets too stressed, she experiences tightening sensation in her chest.  At these times, she has to take a break to calm herself down.    She never had this sensation before she started working at the [employer], and notes "I just wrote it off due to stress" telling herself she needs to calm down.  When the symptoms happen, states "it's like I can't do anything other than take a deep breath to calm myself down."    It is not an every day occurrence.  Has recently seen these episodes slow down.   Notes that the last one occurred two weeks ago, "where I got so upset that [it happened]."  This symptom only occurs when she's at work / working.  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  Denies red flag cardiac symptoms.  - Reducing Caffeine She has cut back on her caffeine intake; notes she has maybe one soda per week, whereas in the past she used to have 1-2 sodas per day.  - Sleep Notes she tries her hardest to stick to a regular sleep schedule.  - Gastrointestinal Health Denies concerns, however notes she experiences diarrhea when she's under stress.  Notes she typically has it in the morning before work; "I think because my body knows I have to go to work."    Immunization History  Administered Date(s) Administered  . Influenza-Unspecified 02/11/2015, 03/12/2017, 02/13/2018, 03/12/2018, 03/06/2019  . Tdap 01/17/2018     Health Maintenance  Topic Date Due  . PAP-Cervical Cytology Screening  12/20/2020  . PAP SMEAR-Modifier  12/20/2020  . TETANUS/TDAP  01/18/2028  . INFLUENZA VACCINE  Completed  . HIV Screening  Completed     Wt Readings from Last 3 Encounters:  07/25/19 207 lb 1.6 oz (93.9 kg)  05/12/19 200 lb (90.7 kg)  08/07/18 186 lb 12.8 oz (84.7 kg)   BP Readings from Last 3 Encounters:  07/25/19 112/75  05/12/19 (!) 134/96  08/07/18 109/74   Pulse Readings from Last 3 Encounters:  07/25/19 77  05/12/19 100  08/07/18 70     Past Medical History:  Diagnosis Date  . Alopecia       History reviewed. No pertinent surgical history.    Family History  Problem Relation Age of Onset  . Diabetes Father 72  . Hypertension Father 82  . Depression Brother 10       bipolar  . Cancer Maternal Aunt 45         colon  . Heart attack Paternal Grandmother 29      Social History   Substance and Sexual Activity  Drug Use No  ,   Social History   Substance and Sexual Activity  Alcohol Use Yes  . Alcohol/week: 1.0 standard drinks  . Types: 1 Glasses of wine per week  ,   Social History   Tobacco Use  Smoking Status Former Smoker  . Packs/day: 0.25  . Years: 1.00  .  Pack years: 0.25  . Types: Cigarettes  . Quit date: 06/12/2010  . Years since quitting: 9.1  Smokeless Tobacco Never Used  ,   Social History   Substance and Sexual Activity  Sexual Activity Yes  . Birth control/protection: Implant    Current Outpatient Medications on File Prior to Visit  Medication Sig Dispense Refill  . aspirin-acetaminophen-caffeine (EXCEDRIN MIGRAINE) 250-250-65 MG tablet Take 1-2 tablets by mouth every 6 (six) hours as needed for headache.    . fexofenadine-pseudoephedrine (ALLEGRA-D ALLERGY & CONGESTION) 180-240 MG 24 hr tablet Take 1 tablet by mouth daily. 90 tablet 1  . fluticasone (FLONASE) 50 MCG/ACT nasal spray Place 1 spray into both nostrils 2 (two) times daily. 16 g 6  . Multiple Vitamin (MULTIVITAMIN) capsule Take 1 capsule by mouth daily.     No current facility-administered medications on file prior to visit.    Allergies: Rizatriptan  Review of Systems: General:   Denies fever, chills, unexplained weight loss.  Optho/Auditory:   Denies visual changes, blurred vision/LOV Respiratory:   Denies SOB, DOE more than baseline levels.   Cardiovascular:   Denies chest pain, palpitations, new onset peripheral edema  Gastrointestinal:   Denies nausea, vomiting, diarrhea.  Genitourinary: Denies dysuria, freq/ urgency, flank pain or discharge from genitals.  Endocrine:     Denies hot or cold intolerance, polyuria, polydipsia. Musculoskeletal:   Denies unexplained myalgias, joint swelling, unexplained arthralgias, gait problems.  Skin:  Denies rash, suspicious lesions Neurological:      Denies dizziness, unexplained weakness, numbness  Psychiatric/Behavioral:   Denies mood changes, suicidal or homicidal ideations, hallucinations    Objective:    Blood pressure 112/75, pulse 77, temperature (!) 97.4 F (36.3 C), temperature source Oral, height 5' 3.25" (1.607 m), weight 207 lb 1.6 oz (93.9 kg), last menstrual period 07/20/2019, SpO2 98 %. Body mass index is 36.4 kg/m. General Appearance:    Alert, cooperative, no distress, appears stated age  Head:    Normocephalic, without obvious abnormality, atraumatic  Eyes:    PERRL, conjunctiva/corneas clear, EOM's intact, fundi    benign, both eyes  Ears:    Normal TM's and external ear canals, both ears  Nose:   Nares normal, septum midline, mucosa normal, no drainage    or sinus tenderness  Throat:   Lips w/o lesion, mucosa moist, and tongue normal; teeth and   gums normal  Neck:   Supple, symmetrical, trachea midline, no adenopathy;    thyroid:  no enlargement/tenderness/nodules; no carotid   bruit or JVD  Back:     Symmetric, no curvature, ROM normal, no CVA tenderness  Lungs:     Clear to auscultation bilaterally, respirations unlabored, no       Wh/ R/ R  Chest Wall:    No tenderness or gross deformity; normal excursion   Heart:    Regular rate and rhythm, S1 and S2 normal, no murmur, rub   or gallop  Breast Exam:    Deferred to OBGYN.  Abdomen:     Soft, non-tender, bowel sounds active all four quadrants, NO   G/R/R, no masses, no organomegaly  Genitalia:    Deferred to OBGYN.  Rectal:    Deferred.  Extremities:   Extremities normal, atraumatic, no cyanosis or gross edema  Pulses:   2+ and symmetric all extremities  Skin:   Warm, dry, Skin color, texture, turgor normal, no obvious rashes or lesions Psych: No HI/SI, judgement and insight good, Euthymic mood. Full Affect.  Neurologic:  CNII-XII intact, normal strength, sensation and reflexes    Throughout

## 2019-07-25 NOTE — Patient Instructions (Addendum)
Please call Dr. Leonie Green of [facility] for your alopecia.     Preventive Care for Adults, Female  A healthy lifestyle and preventive care can promote health and wellness. Preventive health guidelines for women include the following key practices.   A routine yearly physical is a good way to check with your health care provider about your health and preventive screening. It is a chance to share any concerns and updates on your health and to receive a thorough exam.   Visit your dentist for a routine exam and preventive care every 6 months. Brush your teeth twice a day and floss once a day. Good oral hygiene prevents tooth decay and gum disease.   The frequency of eye exams is based on your age, health, family medical history, use of contact lenses, and other factors. Follow your health care provider's recommendations for frequency of eye exams.   Eat a healthy diet. Foods like vegetables, fruits, whole grains, low-fat dairy products, and lean protein foods contain the nutrients you need without too many calories. Decrease your intake of foods high in solid fats, added sugars, and salt. Eat the right amount of calories for you.Get information about a proper diet from your health care provider, if necessary.   Regular physical exercise is one of the most important things you can do for your health. Most adults should get at least 150 minutes of moderate-intensity exercise (any activity that increases your heart rate and causes you to sweat) each week. In addition, most adults need muscle-strengthening exercises on 2 or more days a week.   Maintain a healthy weight. The body mass index (BMI) is a screening tool to identify possible weight problems. It provides an estimate of body fat based on height and weight. Your health care provider can find your BMI, and can help you achieve or maintain a healthy weight.For adults 20 years and older:   - A BMI below 18.5 is considered  underweight.   - A BMI of 18.5 to 24.9 is normal.   - A BMI of 25 to 29.9 is considered overweight.   - A BMI of 30 and above is considered obese.   Maintain normal blood lipids and cholesterol levels by exercising and minimizing your intake of trans and saturated fats.  Eat a balanced diet with plenty of fruit and vegetables. Blood tests for lipids and cholesterol should begin at age 52 and be repeated every 5 years minimum.  If your lipid or cholesterol levels are high, you are over 40, or you are at high risk for heart disease, you may need your cholesterol levels checked more frequently.Ongoing high lipid and cholesterol levels should be treated with medicines if diet and exercise are not working.   If you smoke, find out from your health care provider how to quit. If you do not use tobacco, do not start.   Lung cancer screening is recommended for adults aged 33-80 years who are at high risk for developing lung cancer because of a history of smoking. A yearly low-dose CT scan of the lungs is recommended for people who have at least a 30-pack-year history of smoking and are a current smoker or have quit within the past 15 years. A pack year of smoking is smoking an average of 1 pack of cigarettes a day for 1 year (for example: 1 pack a day for 30 years or 2 packs a day for 15 years). Yearly screening should continue until the smoker has stopped smoking  for at least 15 years. Yearly screening should be stopped for people who develop a health problem that would prevent them from having lung cancer treatment.   If you are pregnant, do not drink alcohol. If you are breastfeeding, be very cautious about drinking alcohol. If you are not pregnant and choose to drink alcohol, do not have more than 1 drink per day. One drink is considered to be 12 ounces (355 mL) of beer, 5 ounces (148 mL) of wine, or 1.5 ounces (44 mL) of liquor.   Avoid use of street drugs. Do not share needles with anyone. Ask for  help if you need support or instructions about stopping the use of drugs.   High blood pressure causes heart disease and increases the risk of stroke. Your blood pressure should be checked at least yearly.  Ongoing high blood pressure should be treated with medicines if weight loss and exercise do not work.   If you are 34-65 years old, ask your health care provider if you should take aspirin to prevent strokes.   Diabetes screening involves taking a blood sample to check your fasting blood sugar level. This should be done once every 3 years, after age 59, if you are within normal weight and without risk factors for diabetes. Testing should be considered at a younger age or be carried out more frequently if you are overweight and have at least 1 risk factor for diabetes.   Breast cancer screening is essential preventive care for women. You should practice "breast self-awareness."  This means understanding the normal appearance and feel of your breasts and may include breast self-examination.  Any changes detected, no matter how small, should be reported to a health care provider.  Women in their 41s and 30s should have a clinical breast exam (CBE) by a health care provider as part of a regular health exam every 1 to 3 years.  After age 110, women should have a CBE every year.  Starting at age 71, women should consider having a mammogram (breast X-ray test) every year.  Women who have a family history of breast cancer should talk to their health care provider about genetic screening.  Women at a high risk of breast cancer should talk to their health care providers about having an MRI and a mammogram every year.   -Breast cancer gene (BRCA)-related cancer risk assessment is recommended for women who have family members with BRCA-related cancers. BRCA-related cancers include breast, ovarian, tubal, and peritoneal cancers. Having family members with these cancers may be associated with an increased risk for  harmful changes (mutations) in the breast cancer genes BRCA1 and BRCA2. Results of the assessment will determine the need for genetic counseling and BRCA1 and BRCA2 testing.   The Pap test is a screening test for cervical cancer. A Pap test can show cell changes on the cervix that might become cervical cancer if left untreated. A Pap test is a procedure in which cells are obtained and examined from the lower end of the uterus (cervix).   - Women should have a Pap test starting at age 35.   - Between ages 26 and 84, Pap tests should be repeated every 2 years.   - Beginning at age 81, you should have a Pap test every 3 years as long as the past 3 Pap tests have been normal.   - Some women have medical problems that increase the chance of getting cervical cancer. Talk to your health care provider about  these problems. It is especially important to talk to your health care provider if a new problem develops soon after your last Pap test. In these cases, your health care provider may recommend more frequent screening and Pap tests.   - The above recommendations are the same for women who have or have not gotten the vaccine for human papillomavirus (HPV).   - If you had a hysterectomy for a problem that was not cancer or a condition that could lead to cancer, then you no longer need Pap tests. Even if you no longer need a Pap test, a regular exam is a good idea to make sure no other problems are starting.   - If you are between ages 18 and 17 years, and you have had normal Pap tests going back 10 years, you no longer need Pap tests. Even if you no longer need a Pap test, a regular exam is a good idea to make sure no other problems are starting.   - If you have had past treatment for cervical cancer or a condition that could lead to cancer, you need Pap tests and screening for cancer for at least 20 years after your treatment.   - If Pap tests have been discontinued, risk factors (such as a new sexual  partner) need to be reassessed to determine if screening should be resumed.   - The HPV test is an additional test that may be used for cervical cancer screening. The HPV test looks for the virus that can cause the cell changes on the cervix. The cells collected during the Pap test can be tested for HPV. The HPV test could be used to screen women aged 69 years and older, and should be used in women of any age who have unclear Pap test results. After the age of 47, women should have HPV testing at the same frequency as a Pap test.   Colorectal cancer can be detected and often prevented. Most routine colorectal cancer screening begins at the age of 13 years and continues through age 46 years. However, your health care provider may recommend screening at an earlier age if you have risk factors for colon cancer. On a yearly basis, your health care provider may provide home test kits to check for hidden blood in the stool.  Use of a small camera at the end of a tube, to directly examine the colon (sigmoidoscopy or colonoscopy), can detect the earliest forms of colorectal cancer. Talk to your health care provider about this at age 40, when routine screening begins. Direct exam of the colon should be repeated every 5 -10 years through age 26 years, unless early forms of pre-cancerous polyps or small growths are found.   People who are at an increased risk for hepatitis B should be screened for this virus. You are considered at high risk for hepatitis B if:  -You were born in a country where hepatitis B occurs often. Talk with your health care provider about which countries are considered high risk.  - Your parents were born in a high-risk country and you have not received a shot to protect against hepatitis B (hepatitis B vaccine).  - You have HIV or AIDS.  - You use needles to inject street drugs.  - You live with, or have sex with, someone who has Hepatitis B.  - You get hemodialysis treatment.  - You  take certain medicines for conditions like cancer, organ transplantation, and autoimmune conditions.   Hepatitis C  blood testing is recommended for all people born from [birth year] through 1964 and any individual with known risks for hepatitis C.   Practice safe sex. Use condoms and avoid high-risk sexual practices to reduce the spread of sexually transmitted infections (STIs). STIs include gonorrhea, chlamydia, syphilis, trichomonas, herpes, HPV, and human immunodeficiency virus (HIV). Herpes, HIV, and HPV are viral illnesses that have no cure. They can result in disability, cancer, and death. Sexually active women aged 45 years and younger should be checked for chlamydia. Older women with new or multiple partners should also be tested for chlamydia. Testing for other STIs is recommended if you are sexually active and at increased risk.   Osteoporosis is a disease in which the bones lose minerals and strength with aging. This can result in serious bone fractures or breaks. The risk of osteoporosis can be identified using a bone density scan. Women ages 5 years and over and women at risk for fractures or osteoporosis should discuss screening with their health care providers. Ask your health care provider whether you should take a calcium supplement or vitamin D to There are also several preventive steps women can take to avoid osteoporosis and resulting fractures or to keep osteoporosis from worsening. -->Recommendations include:  Eat a balanced diet high in fruits, vegetables, calcium, and vitamins.  Get enough calcium. The recommended total intake of is 1,200 mg daily; for best absorption, if taking supplements, divide doses into 250-500 mg doses throughout the day. Of the two types of calcium, calcium carbonate is best absorbed when taken with food but calcium citrate can be taken on an empty stomach.  Get enough vitamin D. NAMS and the [place] recommend at least 1,000 IU per  day for women age 34 and over who are at risk of vitamin D deficiency. Vitamin D deficiency can be caused by inadequate sun exposure (for example, those who live in [place]).  Avoid alcohol and smoking. Heavy alcohol intake (more than 7 drinks per week) increases the risk of falls and hip fracture and women smokers tend to lose bone more rapidly and have lower bone mass than nonsmokers. Stopping smoking is one of the most important changes women can make to improve their health and decrease risk for disease.  Be physically active every day. Weight-bearing exercise (for example, fast walking, hiking, jogging, and weight training) may strengthen bones or slow the rate of bone loss that comes with aging. Balancing and muscle-strengthening exercises can reduce the risk of falling and fracture.  Consider therapeutic medications. Currently, several types of effective drugs are available. Healthcare providers can recommend the type most appropriate for each woman.  Eliminate environmental factors that may contribute to accidents. Falls cause nearly 90% of all osteoporotic fractures, so reducing this risk is an important bone-health strategy. Measures include ample lighting, removing obstructions to walking, using nonskid rugs on floors, and placing mats and/or grab bars in showers.  Be aware of medication side effects. Some common medicines make bones weaker. These include a type of steroid drug called glucocorticoids used for arthritis and asthma, some antiseizure drugs, certain sleeping pills, treatments for endometriosis, and some cancer drugs. An overactive thyroid gland or using too much thyroid hormone for an underactive thyroid can also be a problem. If you are taking these medicines, talk to your doctor about what you can do to help protect your bones.reduce the rate of osteoporosis.    Menopause can be associated with physical symptoms and risks. Hormone replacement therapy is  available to  decrease symptoms and risks. You should talk to your health care provider about whether hormone replacement therapy is right for you.   Use sunscreen. Apply sunscreen liberally and repeatedly throughout the day. You should seek shade when your shadow is shorter than you. Protect yourself by wearing long sleeves, pants, a wide-brimmed hat, and sunglasses year round, whenever you are outdoors.   Once a month, do a whole body skin exam, using a mirror to look at the skin on your back. Tell your health care provider of new moles, moles that have irregular borders, moles that are larger than a pencil eraser, or moles that have changed in shape or color.   -Stay current with required vaccines (immunizations).   Influenza vaccine. All adults should be immunized every year.  Tetanus, diphtheria, and acellular pertussis (Td, Tdap) vaccine. Pregnant women should receive 1 dose of Tdap vaccine during each pregnancy. The dose should be obtained regardless of the length of time since the last dose. Immunization is preferred during the 27th 36th week of gestation. An adult who has not previously received Tdap or who does not know her vaccine status should receive 1 dose of Tdap. This initial dose should be followed by tetanus and diphtheria toxoids (Td) booster doses every 10 years. Adults with an unknown or incomplete history of completing a 3-dose immunization series with Td-containing vaccines should begin or complete a primary immunization series including a Tdap dose. Adults should receive a Td booster every 10 years.  Varicella vaccine. An adult without evidence of immunity to varicella should receive 2 doses or a second dose if she has previously received 1 dose. Pregnant females who do not have evidence of immunity should receive the first dose after pregnancy. This first dose should be obtained before leaving the health care facility. The second dose should be obtained 4 8 weeks after the first  dose.  Human papillomavirus (HPV) vaccine. Females aged 14 26 years who have not received the vaccine previously should obtain the 3-dose series. The vaccine is not recommended for use in pregnant females. However, pregnancy testing is not needed before receiving a dose. If a female is found to be pregnant after receiving a dose, no treatment is needed. In that case, the remaining doses should be delayed until after the pregnancy. Immunization is recommended for any person with an immunocompromised condition through the age of 3 years if she did not get any or all doses earlier. During the 3-dose series, the second dose should be obtained 4 8 weeks after the first dose. The third dose should be obtained 24 weeks after the first dose and 16 weeks after the second dose.  Zoster vaccine. One dose is recommended for adults aged 24 years or older unless certain conditions are present.  Measles, mumps, and rubella (MMR) vaccine. Adults born before [birth year] generally are considered immune to measles and mumps. Adults born in [birth year] or later should have 1 or more doses of MMR vaccine unless there is a contraindication to the vaccine or there is laboratory evidence of immunity to each of the three diseases. A routine second dose of MMR vaccine should be obtained at least 28 days after the first dose for students attending postsecondary schools, health care workers, or international travelers. People who received inactivated measles vaccine or an unknown type of measles vaccine during 1963 1967 should receive 2 doses of MMR vaccine. People who received inactivated mumps vaccine or an unknown type of mumps vaccine before 1979  and are at high risk for mumps infection should consider immunization with 2 doses of MMR vaccine. For females of childbearing age, rubella immunity should be determined. If there is no evidence of immunity, females who are not pregnant should be vaccinated. If there is no evidence of immunity, females  who are pregnant should delay immunization until after pregnancy. Unvaccinated health care workers born before [birth year] who lack laboratory evidence of measles, mumps, or rubella immunity or laboratory confirmation of disease should consider measles and mumps immunization with 2 doses of MMR vaccine or rubella immunization with 1 dose of MMR vaccine.  Pneumococcal 13-valent conjugate (PCV13) vaccine. When indicated, a person who is uncertain of her immunization history and has no record of immunization should receive the PCV13 vaccine. An adult aged 10 years or older who has certain medical conditions and has not been previously immunized should receive 1 dose of PCV13 vaccine. This PCV13 should be followed with a dose of pneumococcal polysaccharide (PPSV23) vaccine. The PPSV23 vaccine dose should be obtained at least 8 weeks after the dose of PCV13 vaccine. An adult aged 33 years or older who has certain medical conditions and previously received 1 or more doses of PPSV23 vaccine should receive 1 dose of PCV13. The PCV13 vaccine dose should be obtained 1 or more years after the last PPSV23 vaccine dose.  Pneumococcal polysaccharide (PPSV23) vaccine. When PCV13 is also indicated, PCV13 should be obtained first. All adults aged 48 years and older should be immunized. An adult younger than age 31 years who has certain medical conditions should be immunized. Any person who resides in a nursing home or long-term care facility should be immunized. An adult smoker should be immunized. People with an immunocompromised condition and certain other conditions should receive both PCV13 and PPSV23 vaccines. People with human immunodeficiency virus (HIV) infection should be immunized as soon as possible after diagnosis. Immunization during chemotherapy or radiation therapy should be avoided. Routine use of PPSV23 vaccine is not recommended for American Indians, [place] Natives, or people younger than 65 years unless there are  medical conditions that require PPSV23 vaccine. When indicated, people who have unknown immunization and have no record of immunization should receive PPSV23 vaccine. One-time revaccination 5 years after the first dose of PPSV23 is recommended for people aged 40 64 years who have chronic kidney failure, nephrotic syndrome, asplenia, or immunocompromised conditions. People who received 1 2 doses of PPSV23 before age 33 years should receive another dose of PPSV23 vaccine at age 109 years or later if at least 5 years have passed since the previous dose. Doses of PPSV23 are not needed for people immunized with PPSV23 at or after age 49 years.  Meningococcal vaccine. Adults with asplenia or persistent complement component deficiencies should receive 2 doses of quadrivalent meningococcal conjugate (MenACWY-D) vaccine. The doses should be obtained at least 2 months apart. Microbiologists working with certain meningococcal bacteria, [employer] recruits, people at risk during an outbreak, and people who travel to or live in countries with a high rate of meningitis should be immunized. A first-year college student up through age 56 years who is living in a residence hall should receive a dose if she did not receive a dose on or after her 16th birthday. Adults who have certain high-risk conditions should receive one or more doses of vaccine.  Hepatitis A vaccine. Adults who wish to be protected from this disease, have certain high-risk conditions, work with hepatitis A-infected animals, work in hepatitis A research labs, or  travel to or work in countries with a high rate of hepatitis A should be immunized. Adults who were previously unvaccinated and who anticipate close contact with an international adoptee during the first 60 days after arrival in the [place] from a country with a high rate of hepatitis A should be immunized.  Hepatitis B vaccine.  Adults who wish to be protected from this disease, have certain  high-risk conditions, may be exposed to blood or other infectious body fluids, are household contacts or sex partners of hepatitis B positive people, are clients or workers in certain care facilities, or travel to or work in countries with a high rate of hepatitis B should be immunized.  Haemophilus influenzae type b (Hib) vaccine. A previously unvaccinated person with asplenia or sickle cell disease or having a scheduled splenectomy should receive 1 dose of Hib vaccine. Regardless of previous immunization, a recipient of a hematopoietic stem cell transplant should receive a 3-dose series 6 12 months after her successful transplant. Hib vaccine is not recommended for adults with HIV infection.  Preventive Services / Frequency Ages 38 to 39years  Blood pressure check.** / Every 1 to 2 years.  Lipid and cholesterol check.** / Every 5 years beginning at age 87.  Clinical breast exam.** / Every 3 years for women in their 61s and 60s.  BRCA-related cancer risk assessment.** / For women who have family members with a BRCA-related cancer (breast, ovarian, tubal, or peritoneal cancers).  Pap test.** / Every 2 years from ages 59 through 35. Every 3 years starting at age 10 through age 15 or 57 with a history of 3 consecutive normal Pap tests.  HPV screening.** / Every 3 years from ages 64 through ages 73 to 61 with a history of 3 consecutive normal Pap tests.  Hepatitis C blood test.** / For any individual with known risks for hepatitis C.  Skin self-exam. / Monthly.  Influenza vaccine. / Every year.  Tetanus, diphtheria, and acellular pertussis (Tdap, Td) vaccine.** / Consult your health care provider. Pregnant women should receive 1 dose of Tdap vaccine during each pregnancy. 1 dose of Td every 10 years.  Varicella vaccine.** / Consult your health care provider. Pregnant females who do not have evidence of immunity should receive the first dose after pregnancy.  HPV vaccine. / 3 doses over 6  months, if 28 and younger. The vaccine is not recommended for use in pregnant females. However, pregnancy testing is not needed before receiving a dose.  Measles, mumps, rubella (MMR) vaccine.** / You need at least 1 dose of MMR if you were born in 1957 or later. You may also need a 2nd dose. For females of childbearing age, rubella immunity should be determined. If there is no evidence of immunity, females who are not pregnant should be vaccinated. If there is no evidence of immunity, females who are pregnant should delay immunization until after pregnancy.  Pneumococcal 13-valent conjugate (PCV13) vaccine.** / Consult your health care provider.  Pneumococcal polysaccharide (PPSV23) vaccine.** / 1 to 2 doses if you smoke cigarettes or if you have certain conditions.  Meningococcal vaccine.** / 1 dose if you are age 75 to 30 years and a Market researcher living in a residence hall, or have one of several medical conditions, you need to get vaccinated against meningococcal disease. You may also need additional booster doses.  Hepatitis A vaccine.** / Consult your health care provider.  Hepatitis B vaccine.** / Consult your health care provider.  Haemophilus influenzae  type b (Hib) vaccine.** / Consult your health care provider.  Ages 5 to 64years  Blood pressure check.** / Every 1 to 2 years.  Lipid and cholesterol check.** / Every 5 years beginning at age 98 years.  Lung cancer screening. / Every year if you are aged 61 80 years and have a 30-pack-year history of smoking and currently smoke or have quit within the past 15 years. Yearly screening is stopped once you have quit smoking for at least 15 years or develop a health problem that would prevent you from having lung cancer treatment.  Clinical breast exam.** / Every year after age 12 years.  BRCA-related cancer risk assessment.** / For women who have family members with a BRCA-related cancer (breast, ovarian, tubal, or  peritoneal cancers).  Mammogram.** / Every year beginning at age 66 years and continuing for as long as you are in good health. Consult with your health care provider.  Pap test.** / Every 3 years starting at age 75 years through age 58 or 46 years with a history of 3 consecutive normal Pap tests.  HPV screening.** / Every 3 years from ages 85 years through ages 62 to 53 years with a history of 3 consecutive normal Pap tests.  Fecal occult blood test (FOBT) of stool. / Every year beginning at age 10 years and continuing until age 64 years. You may not need to do this test if you get a colonoscopy every 10 years.  Flexible sigmoidoscopy or colonoscopy.** / Every 5 years for a flexible sigmoidoscopy or every 10 years for a colonoscopy beginning at age 53 years and continuing until age 70 years.  Hepatitis C blood test.** / For all people born from [birth year] through 1964 and any individual with known risks for hepatitis C.  Skin self-exam. / Monthly.  Influenza vaccine. / Every year.  Tetanus, diphtheria, and acellular pertussis (Tdap/Td) vaccine.** / Consult your health care provider. Pregnant women should receive 1 dose of Tdap vaccine during each pregnancy. 1 dose of Td every 10 years.  Varicella vaccine.** / Consult your health care provider. Pregnant females who do not have evidence of immunity should receive the first dose after pregnancy.  Zoster vaccine.** / 1 dose for adults aged 57 years or older.  Measles, mumps, rubella (MMR) vaccine.** / You need at least 1 dose of MMR if you were born in 1957 or later. You may also need a 2nd dose. For females of childbearing age, rubella immunity should be determined. If there is no evidence of immunity, females who are not pregnant should be vaccinated. If there is no evidence of immunity, females who are pregnant should delay immunization until after pregnancy.  Pneumococcal 13-valent conjugate (PCV13) vaccine.** / Consult your health care  provider.  Pneumococcal polysaccharide (PPSV23) vaccine.** / 1 to 2 doses if you smoke cigarettes or if you have certain conditions.  Meningococcal vaccine.** / Consult your health care provider.  Hepatitis A vaccine.** / Consult your health care provider.  Hepatitis B vaccine.** / Consult your health care provider.  Haemophilus influenzae type b (Hib) vaccine.** / Consult your health care provider.  Ages 60 years and over  Blood pressure check.** / Every 1 to 2 years.  Lipid and cholesterol check.** / Every 5 years beginning at age 69 years.  Lung cancer screening. / Every year if you are aged 13 80 years and have a 30-pack-year history of smoking and currently smoke or have quit within the past 15 years. Yearly screening is stopped  once you have quit smoking for at least 15 years or develop a health problem that would prevent you from having lung cancer treatment.  Clinical breast exam.** / Every year after age 1 years.  BRCA-related cancer risk assessment.** / For women who have family members with a BRCA-related cancer (breast, ovarian, tubal, or peritoneal cancers).  Mammogram.** / Every year beginning at age 51 years and continuing for as long as you are in good health. Consult with your health care provider.  Pap test.** / Every 3 years starting at age 13 years through age 65 or 50 years with 3 consecutive normal Pap tests. Testing can be stopped between 65 and 70 years with 3 consecutive normal Pap tests and no abnormal Pap or HPV tests in the past 10 years.  HPV screening.** / Every 3 years from ages 58 years through ages 67 or 45 years with a history of 3 consecutive normal Pap tests. Testing can be stopped between 65 and 70 years with 3 consecutive normal Pap tests and no abnormal Pap or HPV tests in the past 10 years.  Fecal occult blood test (FOBT) of stool. / Every year beginning at age 58 years and continuing until age 49 years. You may not need to do this test if you  get a colonoscopy every 10 years.  Flexible sigmoidoscopy or colonoscopy.** / Every 5 years for a flexible sigmoidoscopy or every 10 years for a colonoscopy beginning at age 39 years and continuing until age 74 years.  Hepatitis C blood test.** / For all people born from [birth year] through 1964 and any individual with known risks for hepatitis C.  Osteoporosis screening.** / A one-time screening for women ages 60 years and over and women at risk for fractures or osteoporosis.  Skin self-exam. / Monthly.  Influenza vaccine. / Every year.  Tetanus, diphtheria, and acellular pertussis (Tdap/Td) vaccine.** / 1 dose of Td every 10 years.  Varicella vaccine.** / Consult your health care provider.  Zoster vaccine.** / 1 dose for adults aged 7 years or older.  Pneumococcal 13-valent conjugate (PCV13) vaccine.** / Consult your health care provider.  Pneumococcal polysaccharide (PPSV23) vaccine.** / 1 dose for all adults aged 81 years and older.  Meningococcal vaccine.** / Consult your health care provider.  Hepatitis A vaccine.** / Consult your health care provider.  Hepatitis B vaccine.** / Consult your health care provider.  Haemophilus influenzae type b (Hib) vaccine.** / Consult your health care provider. ** Family history and personal history of risk and conditions may change your health care provider's recommendations. Document Released: 07/25/2001 Document Revised: 03/19/2013  [facility] Patient Information 2014 [place], [place].   EXERCISE AND DIET:  We recommended that you start or continue a regular exercise program for good health. Regular exercise means any activity that makes your heart beat faster and makes you sweat.  We recommend exercising at least 30 minutes per day at least 3 days a week, preferably 5.  We also recommend a diet low in fat and sugar / carbohydrates.  Inactivity, poor dietary choices and obesity can cause diabetes, heart attack, stroke, and kidney damage, among  others.     ALCOHOL AND SMOKING:  Women should limit their alcohol intake to no more than 7 drinks/beers/glasses of wine (combined, not each!) per week. Moderation of alcohol intake to this level decreases your risk of breast cancer and liver damage.  ( And of course, no recreational drugs are part of a healthy lifestyle.)  Also, you should not be  smoking at all or even being exposed to second hand smoke. Most people know smoking can cause cancer, and various heart and lung diseases, but did you know it also contributes to weakening of your bones?  Aging of your skin?  Yellowing of your teeth and nails?   CALCIUM AND VITAMIN D:  Adequate intake of calcium and Vitamin D are recommended.  The recommendations for exact amounts of these supplements seem to change often, but generally speaking 600 mg of calcium (either carbonate or citrate) and 800 units of Vitamin D per day seems prudent. Certain women may benefit from higher intake of Vitamin D.  If you are among these women, your doctor will have told you during your visit.     PAP SMEARS:  Pap smears, to check for cervical cancer or precancers,  have traditionally been done yearly, although recent scientific advances have shown that most women can have pap smears less often.  However, every woman still should have a physical exam from her gynecologist or primary care physician every year. It will include a breast check, inspection of the vulva and vagina to check for abnormal growths or skin changes, a visual exam of the cervix, and then an exam to evaluate the size and shape of the uterus and ovaries.  And after 28 years of age, a rectal exam is indicated to check for rectal cancers. We will also provide age appropriate advice regarding health maintenance, like when you should have certain vaccines, screening for sexually transmitted diseases, bone density testing, colonoscopy, mammograms, etc.    MAMMOGRAMS:  All women over 81 years old should have  a yearly mammogram. Many facilities now offer a "3D" mammogram, which may cost around $50 extra out of pocket. If possible,  we recommend you accept the option to have the 3D mammogram performed.  It both reduces the number of women who will be called back for extra views which then turn out to be normal, and it is better than the routine mammogram at detecting truly abnormal areas.     COLONOSCOPY:  Colonoscopy to screen for colon cancer is recommended for all women at age 77.  We know, you hate the idea of the prep.  We agree, BUT, having colon cancer and not knowing it is worse!!  Colon cancer so often starts as a polyp that can be seen and removed at colonscopy, which can quite literally save your life!  And if your first colonoscopy is normal and you have no family history of colon cancer, most women don't have to have it again for 10 years.  Once every ten years, you can do something that may end up saving your life, right?  We will be happy to help you get it scheduled when you are ready.  Be sure to check your insurance coverage so you understand how much it will cost.  It may be covered as a preventative service at no cost, but you should check your particular policy.

## 2019-07-26 LAB — CBC WITH DIFFERENTIAL/PLATELET
Basophils Absolute: 0.1 10*3/uL (ref 0.0–0.2)
Basos: 1 %
EOS (ABSOLUTE): 0.4 10*3/uL (ref 0.0–0.4)
Eos: 4 %
Hematocrit: 39.9 % (ref 34.0–46.6)
Hemoglobin: 13.7 g/dL (ref 11.1–15.9)
Immature Grans (Abs): 0 10*3/uL (ref 0.0–0.1)
Immature Granulocytes: 1 %
Lymphocytes Absolute: 2.3 10*3/uL (ref 0.7–3.1)
Lymphs: 27 %
MCH: 29.7 pg (ref 26.6–33.0)
MCHC: 34.3 g/dL (ref 31.5–35.7)
MCV: 87 fL (ref 79–97)
Monocytes Absolute: 0.6 10*3/uL (ref 0.1–0.9)
Monocytes: 7 %
Neutrophils Absolute: 5.3 10*3/uL (ref 1.4–7.0)
Neutrophils: 60 %
Platelets: 348 10*3/uL (ref 150–450)
RBC: 4.61 x10E6/uL (ref 3.77–5.28)
RDW: 12.4 % (ref 11.7–15.4)
WBC: 8.6 10*3/uL (ref 3.4–10.8)

## 2019-07-26 LAB — LIPID PANEL
Chol/HDL Ratio: 4.4 ratio (ref 0.0–4.4)
Cholesterol, Total: 148 mg/dL (ref 100–199)
HDL: 34 mg/dL — ABNORMAL LOW (ref >39)
LDL Chol Calc (NIH): 96 mg/dL (ref 0–99)
Triglycerides: 96 mg/dL (ref 0–149)
VLDL Cholesterol Cal: 18 mg/dL (ref 5–40)

## 2019-07-26 LAB — COMPREHENSIVE METABOLIC PANEL
ALT: 39 IU/L — ABNORMAL HIGH (ref 0–32)
AST: 26 IU/L (ref 0–40)
Albumin/Globulin Ratio: 1.3 (ref 1.2–2.2)
Albumin: 4.1 g/dL (ref 3.9–5.0)
Alkaline Phosphatase: 95 IU/L (ref 39–117)
BUN/Creatinine Ratio: 15 (ref 9–23)
BUN: 9 mg/dL (ref 6–20)
Bilirubin Total: 0.3 mg/dL (ref 0.0–1.2)
CO2: 22 mmol/L (ref 20–29)
Calcium: 9 mg/dL (ref 8.7–10.2)
Chloride: 102 mmol/L (ref 96–106)
Creatinine, Ser: 0.61 mg/dL (ref 0.57–1.00)
GFR calc Af Amer: 144 mL/min/{1.73_m2} (ref >59)
GFR calc non Af Amer: 125 mL/min/{1.73_m2} (ref >59)
Globulin, Total: 3.2 g/dL (ref 1.5–4.5)
Glucose: 88 mg/dL (ref 65–99)
Potassium: 4.1 mmol/L (ref 3.5–5.2)
Sodium: 137 mmol/L (ref 134–144)
Total Protein: 7.3 g/dL (ref 6.0–8.5)

## 2019-07-26 LAB — HEMOGLOBIN A1C
Est. average glucose Bld gHb Est-mCnc: 114 mg/dL
Hgb A1c MFr Bld: 5.6 % (ref 4.8–5.6)

## 2019-07-26 LAB — T4, FREE: Free T4: 1.02 ng/dL (ref 0.82–1.77)

## 2019-07-26 LAB — VITAMIN D 25 HYDROXY (VIT D DEFICIENCY, FRACTURES): Vit D, 25-Hydroxy: 22 ng/mL — ABNORMAL LOW (ref 30.0–100.0)

## 2019-07-26 LAB — TSH: TSH: 2.18 u[IU]/mL (ref 0.450–4.500)

## 2019-07-29 MED ORDER — VITAMIN D3 125 MCG (5000 UT) PO TABS: ORAL_TABLET | ORAL | 3 refills | Status: DC

## 2019-08-22 ENCOUNTER — Encounter: Payer: Self-pay | Admitting: Family Medicine

## 2019-08-26 ENCOUNTER — Encounter: Payer: Self-pay | Admitting: Family Medicine

## 2019-08-26 ENCOUNTER — Other Ambulatory Visit: Payer: Self-pay

## 2019-08-26 ENCOUNTER — Ambulatory Visit (INDEPENDENT_AMBULATORY_CARE_PROVIDER_SITE_OTHER): Payer: No Typology Code available for payment source | Admitting: Family Medicine

## 2019-08-26 VITALS — Ht 63.25 in | Wt 207.0 lb

## 2019-08-26 DIAGNOSIS — E786 Lipoprotein deficiency: Secondary | ICD-10-CM

## 2019-08-26 DIAGNOSIS — E669 Obesity, unspecified: Secondary | ICD-10-CM

## 2019-08-26 DIAGNOSIS — Z566 Other physical and mental strain related to work: Secondary | ICD-10-CM | POA: Diagnosis not present

## 2019-08-26 DIAGNOSIS — R0789 Other chest pain: Secondary | ICD-10-CM

## 2019-08-26 DIAGNOSIS — F430 Acute stress reaction: Secondary | ICD-10-CM | POA: Diagnosis not present

## 2019-08-26 DIAGNOSIS — E781 Pure hyperglyceridemia: Secondary | ICD-10-CM | POA: Insufficient documentation

## 2019-08-26 NOTE — Progress Notes (Signed)
Telehealth office visit note for Stefanie White, D.O- at Primary Care at [facility]   I connected with current patient today and verified that I am speaking with the correct person   . Location of the patient: Home . Location of the provider: Office - This visit type was conducted due to national recommendations for restrictions regarding the COVID-19 Pandemic (e.g. social distancing) in an effort to limit this patient's exposure and mitigate transmission in our community.    - No physical exam could be performed with this format, beyond that communicated to [place] by the patient/ family members as noted.   - Additionally my office staff/ schedulers were to discuss with the patient that there may be a monetary charge related to this service, depending on their medical insurance.  My understanding is that patient understood and consented to proceed.     _________________________________________________________________________________   History of Present Illness:  I, Stefanie White, am serving as Neurosurgeon for [name].  Notes she is doing well today.  Since last OV, for the most part, "doing good."  Says "I think the greatest thing that happened was, after our appointment last month, a week later, they announced that they were closing [facility]."  Says they officially are now closed.  Notes "we're in the middle of packing stuff up over there," but that this will be over soon.  For stress relief, she often goes into the corner to calm herself down with a quick meditation.  She believes that meditation is the biggest thing that has helped her mood management.  Says the meditation at night also helps her prepare for bed, and this has been helpful too.  Says at first she thought she wouldn't like meditation, but the more she did it, the more she's enjoyed it.  "I really like the Headspace app, that's probably my favorite app."  Says she really doesn't want to go down the  medication path, and instead wants to see how far she can get with meditation.  She has received both of her COVID-19 vaccinations, which has also helped alleviate her stress.  In terms of exercising and eating right, and making further health adjustments, notes "it's the eating and exercising I've gotta work on."  She and her husband are working on this together, looking at recipes, not buying snack foods.  Says instead, they are getting fruits and vegetables to snack on.  Says "we're working on that this week and hoping to see some progress there."  She loves having her husband on board with her.  She's excited to be able to keep each other accountable and be there for each other, and likes feeling like "it's not just for me."  She likes the element of teamwork and cooperation and helping each other together.  She last experienced chest tightness last week; "I just had so much on my plate, that I started feeling that tightness."  At that time, she tried to step back and take 15 minutes to breathe in and out.  In addition, she told herself "this is what you're going to do, you're going to make a plan and if it doesn't get all done, it doesn't get all done."  She was able to calm herself down in that moment and notes "it's just a baby step."  She almost went to a nutritionist a few weeks ago, and is still interested in this.    GAD 7 : Generalized Anxiety Score 08/26/2019  Nervous, Anxious, on Edge 2  Control/stop worrying 1  Worry too much - different things 1  Trouble relaxing 3  Restless 2  Easily annoyed or irritable 3  Afraid - awful might happen 1  Total GAD 7 Score 13  Anxiety Difficulty Very difficult    Depression screen [facility] 2/9 08/26/2019 07/25/2019 08/07/2018 07/09/2018 06/07/2018  Decreased Interest 0 1 1 1  0  Down, Depressed, Hopeless 0 0 0 0 0  PHQ - 2 Score 0 1 1 1  0  Altered sleeping 1 1 2 1  0  Tired, decreased energy 1 2 1 1 1   Change in appetite 2 2 2 2 1   Feeling bad or  failure about yourself  0 0 0 0 0  Trouble concentrating 0 1 1 0 0  Moving slowly or fidgety/restless 0 0 0 0 0  Suicidal thoughts 0 0 0 0 0  PHQ-9 Score 4 7 7 5 2   Difficult doing work/chores Somewhat difficult Somewhat difficult - - Somewhat difficult  Some recent data might be hidden      Impression and Recommendations:     1. Obesity, Class II, BMI 35-39.9   2. Stress at work   3. Stress reaction   4. Sensation of chest tightness   5. Low HDL (under 40)   6. High triglycerides       Stress at Work, Stress Reaction, Sensation of Chest Tightness - Stable at this time and much improved on current management.  - In lieu of medication, patient prefers to continue moving forward with meditation and with her husband's assistance regarding healthier eating and lifestyle habits.  - During appointment today, again reviewed the "spokes of the wheel" of mood and health management.  Stressed the importance of ongoing prudent habits, including regular exercise, appropriate sleep hygiene, healthful dietary habits, and prayer/meditation to relax.  - Encouraged patient to continue meditation habits as established.  - Advised patient to continue working toward exercising to improve overall mental, physical, and emotional health.    - Will continue to monitor.  Preventative Maintenance - Obesity, Class II, BMI 35-39.9 - Reviewed patient's lifestyle goals during appointment today.  - Healthy dietary habits encouraged, including low-carb, high fiber, high lean protein foods. - Encouraged patient to set discrete, achievable health goals when it comes to exercise and diet.  Advised patient to begin with small steps such as beginning to track her nutrition daily.  - If desired, advised patient to being using LoseIt and MyFitnessPal apps to track intake and work toward her prudent dietary goals.  - As another alternative, advised patient to look into Weight Watchers, especially if she has  coverage through work.  - Patient desires referral to nutritionist today.  Ambulatory referral provided.  See orders. - If desired in future, discussed option of ambulatory referral to [facility] for further assistance.  - Health counseling performed.  All questions answered.  Recommendations - Return end of April to review healthy habits and upcoming visit with nutritionist.    - As part of my medical decision making, I reviewed the following data within the electronic MEDICAL RECORD NUMBER History obtained from pt /family, CMA notes reviewed and incorporated if applicable, Labs reviewed, Radiograph/ tests reviewed if applicable and OV notes from prior OV's with me, as well as other specialists she/he has seen since seeing me last, were all reviewed and used in my medical decision making process today.    - Additionally, when appropriate, discussion had with  patient regarding our treatment plan, and their biases/concerns about that plan were used in my medical decision making today.    - The patient agreed with the plan and demonstrated an understanding of the instructions.   No barriers to understanding were identified.     - The patient was advised to call back or seek an in-person evaluation if the symptoms worsen or if the condition fails to improve as anticipated.   Return for end of april for helathy habits, after seeing nutritionist.    Orders Placed This Encounter  Procedures  . Amb ref to Medical Nutrition Therapy-MNT    Time spent on visit including pre-visit chart review and post-visit care was 14 minutes.  Note:  This note was prepared with assistance of Dragon voice recognition software. Occasional wrong-word or sound-a-like substitutions may have occurred due to the inherent limitations of voice recognition software.  The [place] was signed into law in 2016 which includes the topic of electronic health records.  This provides immediate  access to information in MyChart.  This includes consultation notes, operative notes, office notes, lab results and pathology reports.  If you have any questions about what you read please let us know at your next visit or call us at the office.  We are right here with you.  This document serves as a record of services personally performed by Mellody Dance, DO. It was created on her behalf by Toni Amend, a trained medical scribe. The creation of this record is based on the scribe's personal observations and the provider's statements to them.   This case required medical decision making of at least moderate complexity. The above documentation from Toni Amend, medical scribe, has been reviewed by Marjory Sneddon, D.O.      __________________________________________________________________________________     Patient Care Team    Relationship Specialty Notifications Start End  Mellody Dance, DO PCP - General Family Medicine  03/01/16   Vanessa Kick, MD Consulting Physician Obstetrics and Gynecology  03/01/16   Webb Laws, [place] Referring Physician Optometry  03/01/16      -Vitals obtained; medications/ allergies reconciled;  personal medical, social, Sx etc.histories were updated by CMA, reviewed by me and are reflected in chart   Patient Active Problem List   Diagnosis Date Noted  . Obesity, Class I, BMI 30-34.9 05/22/2016  . Sleeping difficulties 01/17/2018  . Vitamin D insufficiency 05/22/2016  . Low serum HDL 05/22/2016  . Attention and concentration deficit 08/29/2017  . Migraine headache 03/01/2016  . Environmental and seasonal allergies 03/01/2016  . High triglycerides 08/26/2019  . Stress reaction 07/25/2019  . Sensation of chest tightness 07/25/2019  . Hypertriglyceridemia without hypercholesterolemia 07/25/2019  . Low HDL (under 40) 07/25/2019  . Alopecia 07/25/2019  . Tachycardia 06/09/2018  . Sinus headache 06/09/2018  . Physical  deconditioning 06/09/2018  . Dietary counseling 06/09/2018  . Exercise counseling 06/09/2018  . Contact dermatitis and eczema-bilateral axilla 10/11/2017  . Carbuncle and furuncle of leg 03/08/2017  . Acute left otitis media 03/03/2016  . Otitis, externa, infective 03/03/2016  . Pharyngitis 03/03/2016  . Fever, unspecified 03/03/2016  . History of ADHD 03/01/2016  . Viral URI 03/01/2016  . h/o Alopecia areata 05/22/2014     Current Meds  Medication Sig  . aspirin-acetaminophen-caffeine (EXCEDRIN MIGRAINE) 250-250-65 MG tablet Take 1-2 tablets by mouth every 6 (six) hours as needed for headache.  . Cholecalciferol (VITAMIN D3) 125 MCG (5000 UT) TABS 5,000 IU OTC vitamin D3 daily. (Patient  taking differently: 2,000 Units. 5,000 IU OTC vitamin D3 daily.)  . fexofenadine-pseudoephedrine (ALLEGRA-D ALLERGY & CONGESTION) 180-240 MG 24 hr tablet Take 1 tablet by mouth daily. (Patient taking differently: Take 1 tablet by mouth daily. PRN)  . fluticasone (FLONASE) 50 MCG/ACT nasal spray Place 1 spray into both nostrils 2 (two) times daily.  . Multiple Vitamin (MULTIVITAMIN) capsule Take 1 capsule by mouth daily.     Allergies:  Allergies  Allergen Reactions  . Rizatriptan Swelling and Other (See Comments)    Mouth swelling     ROS:  See above HPI for pertinent positives and negatives   Objective:   Height 5' 3.25" (1.607 m), weight 207 lb (93.9 kg), last menstrual period 07/21/2019.  (if some vitals are omitted, this means that patient was UNABLE to obtain them even though they were asked to get them prior to OV today.  They were asked to call us at their earliest convenience with these once obtained. ) General: A & O * 3; sounds in no acute distress; in usual state of health.  Skin: Pt confirms warm and dry extremities and pink fingertips HEENT: Pt confirms lips non-cyanotic Chest: Patient confirms normal chest excursion and movement Respiratory: speaking in full sentences, no  conversational dyspnea; patient confirms no use of accessory muscles Psych: insight appears good, mood- appears full

## 2019-10-01 ENCOUNTER — Encounter: Payer: No Typology Code available for payment source | Attending: Family Medicine | Admitting: Registered"

## 2019-10-01 ENCOUNTER — Other Ambulatory Visit: Payer: Self-pay

## 2019-10-01 ENCOUNTER — Encounter: Payer: Self-pay | Admitting: Registered"

## 2019-10-01 DIAGNOSIS — Z713 Dietary counseling and surveillance: Secondary | ICD-10-CM | POA: Insufficient documentation

## 2019-10-01 NOTE — Patient Instructions (Addendum)
-   Aim to have vegetables with lunch and dinner daily.   - Add in afternoon snack between lunch and dinner (around 3:30 pm) that includes a form of carbohydrate + protein such as:  Fruit and nuts  Fruit and cheese  Peanut butter crackers  Cheese and crackers  Granola bar  Trail mix

## 2019-10-01 NOTE — Progress Notes (Signed)
  Medical Nutrition Therapy:  Appt start time: 8:05 end time:  8:42.   Assessment:  Primary concerns today:   Pt expectations: learn more healthy eating habits  States she is the worst stress eater. States she has gained about 23 lbs during pandemic; thinks its all due to stress eating. States she worked at [employer] during pandemic and most stressful times for her, anxiety increased. States she has started looking into meditation with Head[employer] app; uses breathing exercises with it. States she will also use it if she senses anxiety attacks. Reports she was informed to do it daily, goal is 2x/day. States sometimes at work they leave candy lying out; will snack on this during the day. States she will snack more so when she's home and relaxing at the end of the day. Reports she has been trying to reduce coffee intake to help decrease anxiety; drinking every other day during the week and daily on the weekends. Sometimes she has to work 2nd shift or midshift; will not eat 3 meals/day on the days. States it only happens about once every few months.   States she does not consider portion sizes when eating. Reports feeling bloated sometimes after eating as a result. Also reports she and husband eat out often and she does not think about what's a healthy option or not when choosing what to eat.     Preferred Learning Style:   No preference indicated   Learning Readiness:   Ready  Change in progress   MEDICATIONS: See list   DIETARY INTAKE:  Usual eating pattern includes 2-3 meals and 1-2 snacks per day.  Everyday foods include eggs, grits, bacon, potatoes, steak, chicken, beef, vegetables, candy, and/or popcorn.  Avoided foods include none reported.    24-hr recall:  B (8 AM): eggs + grits + 2 slices of bacon  Snk ( AM):   L (12 PM): burger or grilled cheese + potato wedges or steak + vegetables or rice Snk ( PM): sometimes a few pieces of candy D (6 PM): takeout - Asian cuisine  (stir fry noodles + vegetables + beef + crab rangoons) or barbecue Snk ( PM): sometimes popcorn Beverages: water, orange juice, coffee (1-2 cups), 1/2 bottle of soda  Usual physical activity: none reported; gets a lot of steps at work - averages 9000-10000 steps/day  Estimated energy needs: 2000 calories 225 g carbohydrates 150 g protein 56 g fat  Progress Towards Goal(s):  In progress.   Nutritional Diagnosis:  NB-1.1 Food and nutrition-related knowledge deficit As related to having balanced meals.  As evidenced by dietary recall.    Intervention:  Nutrition education and counseling. Pt was educated and counseled on benefits of eating throughout the day, balanced meal/snack options, and ways to increase fiber intake. Pt was in agreement with goals listed.  Goals: - Aim to have vegetables with lunch and dinner daily.  - Add in afternoon snack between lunch and dinner (around 3:30 pm) that includes a form of carbohydrate + protein such as:  Fruit and nuts  Fruit and cheese  Peanut butter crackers  Cheese and crackers  Granola bar  Trail mix  Teaching Method Utilized:  Visual Auditory Hands on  Handouts given during visit include:  none  Barriers to learning/adherence to lifestyle change: none identified  Demonstrated degree of understanding via:  Teach Back   Monitoring/Evaluation:  Dietary intake, exercise, and body weight in 1 month(s).

## 2019-10-29 ENCOUNTER — Ambulatory Visit: Payer: No Typology Code available for payment source | Admitting: Registered"

## 2019-10-30 ENCOUNTER — Encounter: Payer: No Typology Code available for payment source | Attending: Family Medicine | Admitting: Registered"

## 2019-10-30 ENCOUNTER — Other Ambulatory Visit: Payer: Self-pay

## 2019-10-30 ENCOUNTER — Encounter: Payer: Self-pay | Admitting: Registered"

## 2019-10-30 DIAGNOSIS — Z713 Dietary counseling and surveillance: Secondary | ICD-10-CM | POA: Insufficient documentation

## 2019-10-30 NOTE — Progress Notes (Signed)
  Medical Nutrition Therapy:  Appt start time: 4:11 end time:  4:30   Assessment:  Primary concerns today:   Pt expectations: learn more healthy eating habits  States she was able to work on having vegetables with lunch and dinner. Reports her biggest struggle is with fruit Having it more often, therefore eating them more. States she has been eating them at least every other day. States she has been keeping granola bars in her lunch bag. Reports she has not been feeling the need to snack at the end of the day like she was before. States she has been having fruit snacks at the end of the day to help with sweet cravings.  Going to celebrate mom's birthday and visit friends next week. Looking forward to it. States she has been working a busy schedule this week because she will be out of town next week. States she was so tired and only ate one meal due to working multiple shifts.    Preferred Learning Style:   No preference indicated   Learning Readiness:   Ready  Change in progress   MEDICATIONS: See list   DIETARY INTAKE:  Usual eating pattern includes 2-3 meals and 1-2 snacks per day.  Everyday foods include eggs, grits, bacon, potatoes, steak, chicken, beef, vegetables, candy, and/or popcorn.  Avoided foods include none reported.    24-hr recall:  B (8 AM):   Snk ( AM):   L (12 PM): [employer] - soft taco + crunch wrap Snk ( PM):  D (6 PM): sushi Snk ( PM): sometimes popcorn Beverages: water, orange juice, coffee (1-2 cups), 1/2 bottle of soda  Usual physical activity: none reported; gets a lot of steps at work - averages 9000-10000 steps/day  Estimated energy needs: 2000 calories 225 g carbohydrates 150 g protein 56 g fat  Progress Towards Goal(s):  In progress.   Nutritional Diagnosis:  NB-1.1 Food and nutrition-related knowledge deficit As related to having balanced meals.  As evidenced by dietary recall.    Intervention:  Nutrition education and counseling. Pt  was encouraged to keep up the great work of adding fruit and snacks to her day. Pt was in agreement with goals listed.  Goals:  - Aim to have 3 meals a day along with snacks.  - Keep up the great work increasing fruit and vegetable intake.  Teaching Method Utilized:  Visual Auditory Hands on  Handouts given during visit include:  none  Barriers to learning/adherence to lifestyle change: none identified  Demonstrated degree of understanding via: Teach Back   Monitoring/Evaluation:  Dietary intake, exercise, and body weight in 1 month(s).

## 2019-10-30 NOTE — Patient Instructions (Signed)
-   Aim to have 3 meals a day along with snacks.   - Keep up the great work increasing fruit and vegetable intake.

## 2019-11-17 ENCOUNTER — Emergency Department (HOSPITAL_COMMUNITY): Payer: No Typology Code available for payment source

## 2019-11-17 ENCOUNTER — Ambulatory Visit (HOSPITAL_COMMUNITY)
Admission: EM | Admit: 2019-11-17 | Discharge: 2019-11-17 | Disposition: A | Discharge status: Home / Self Care | Payer: No Typology Code available for payment source | Source: Home / Self Care

## 2019-11-17 ENCOUNTER — Other Ambulatory Visit: Payer: Self-pay

## 2019-11-17 ENCOUNTER — Emergency Department (HOSPITAL_COMMUNITY)
Admission: EM | Admit: 2019-11-17 | Discharge: 2019-11-17 | Disposition: A | Discharge status: Home / Self Care | Payer: No Typology Code available for payment source | Attending: Emergency Medicine | Admitting: Emergency Medicine

## 2019-11-17 ENCOUNTER — Encounter (HOSPITAL_COMMUNITY): Payer: Self-pay | Admitting: Emergency Medicine

## 2019-11-17 DIAGNOSIS — Z79899 Other long term (current) drug therapy: Secondary | ICD-10-CM | POA: Diagnosis not present

## 2019-11-17 DIAGNOSIS — R079 Chest pain, unspecified: Secondary | ICD-10-CM

## 2019-11-17 DIAGNOSIS — R072 Precordial pain: Secondary | ICD-10-CM | POA: Diagnosis not present

## 2019-11-17 DIAGNOSIS — R0602 Shortness of breath: Secondary | ICD-10-CM

## 2019-11-17 DIAGNOSIS — Z87891 Personal history of nicotine dependence: Secondary | ICD-10-CM | POA: Insufficient documentation

## 2019-11-17 DIAGNOSIS — R0789 Other chest pain: Secondary | ICD-10-CM | POA: Diagnosis present

## 2019-11-17 DIAGNOSIS — R000 Tachycardia, unspecified: Secondary | ICD-10-CM

## 2019-11-17 LAB — BASIC METABOLIC PANEL
Anion gap: 9 (ref 5–15)
BUN: 8 mg/dL (ref 6–20)
CO2: 24 mmol/L (ref 22–32)
Calcium: 9.9 mg/dL (ref 8.9–10.3)
Chloride: 102 mmol/L (ref 98–111)
Creatinine, Ser: 0.64 mg/dL (ref 0.44–1.00)
GFR calc Af Amer: >60 mL/min (ref >60)
GFR calc non Af Amer: >60 mL/min (ref >60)
Glucose, Bld: 88 mg/dL (ref 70–99)
Potassium: 3.6 mmol/L (ref 3.5–5.1)
Sodium: 135 mmol/L (ref 135–145)

## 2019-11-17 LAB — TROPONIN I (HIGH SENSITIVITY)
Troponin I (High Sensitivity): 3 ng/L (ref <18)
Troponin I (High Sensitivity): 3 ng/L (ref <18)

## 2019-11-17 LAB — CBC
HCT: 42.6 % (ref 36.0–46.0)
Hemoglobin: 14.4 g/dL (ref 12.0–15.0)
MCH: 29.8 pg (ref 26.0–34.0)
MCHC: 33.8 g/dL (ref 30.0–36.0)
MCV: 88 fL (ref 80.0–100.0)
Platelets: 384 10*3/uL (ref 150–400)
RBC: 4.84 MIL/uL (ref 3.87–5.11)
RDW: 11.9 % (ref 11.5–15.5)
WBC: 13.8 10*3/uL — ABNORMAL HIGH (ref 4.0–10.5)
nRBC: 0 % (ref 0.0–0.2)

## 2019-11-17 LAB — I-STAT BETA HCG BLOOD, ED (MC, WL, AP ONLY): I-stat hCG, quantitative: <5 m[IU]/mL (ref <5)

## 2019-11-17 MED ORDER — DIPHENHYDRAMINE HCL 50 MG/ML IJ SOLN
25.0000 mg | Freq: Once | INTRAMUSCULAR | Status: AC
  Administered 2019-11-17: 25 mg via INTRAVENOUS
  Filled 2019-11-17: qty 1

## 2019-11-17 MED ORDER — IOHEXOL 350 MG/ML SOLN
75.0000 mL | Freq: Once | INTRAVENOUS | Status: AC | PRN
  Administered 2019-11-17: 75 mL via INTRAVENOUS

## 2019-11-17 MED ORDER — PROCHLORPERAZINE EDISYLATE 10 MG/2ML IJ SOLN
10.0000 mg | Freq: Once | INTRAMUSCULAR | Status: AC
  Administered 2019-11-17: 10 mg via INTRAVENOUS
  Filled 2019-11-17: qty 2

## 2019-11-17 MED ORDER — SODIUM CHLORIDE 0.9% FLUSH
3.0000 mL | Freq: Once | INTRAVENOUS | Status: AC
  Administered 2019-11-17: 3 mL via INTRAVENOUS

## 2019-11-17 NOTE — ED Notes (Signed)
Patient is being discharged from the Urgent Care and sent to the Emergency Department via EMS . Per Moshe Cipro, NP, patient is in need of higher level of care due to SOB, tachycardia. Patient is aware and verbalizes understanding of plan of care.  Vitals:   11/17/19 1135  BP: (!) 150/101  Pulse: (!) 111  Resp: 18  Temp: 98 F (36.7 C)  SpO2: 100%

## 2019-11-17 NOTE — ED Provider Notes (Signed)
[facility] EMERGENCY DEPARTMENT Provider Note   CSN: [id number] Arrival date & time: 11/17/19  1226     History Chief Complaint  Patient presents with  . Shortness of Breath  . Chest Pain    Stefanie White is a 29 y.o. female.  HPI      This morning woke up and had chest pain around 10AM Felt shortness of breath Chest pain, started out as a tightness and now pain which is sharp with deep breaths Middle-left side of chest Started slowly decreasing around 4PM Works at [employer] No syncope Began to have headache in the waiting room, hx of migraines, feels like migraine-allergic to triptans No leg pain or swelling No cough  Recent long drive to [place] and [place], just got back 1 week ago after 1.5wk of driving No hx of blood clots Grandmother had CABG and stents when older Used to smoke, occ etoh, no other drugs  Past Medical History:  Diagnosis Date  . Alopecia     Patient Active Problem List   Diagnosis Date Noted  . High triglycerides 08/26/2019  . Stress reaction 07/25/2019  . Sensation of chest tightness 07/25/2019  . Hypertriglyceridemia without hypercholesterolemia 07/25/2019  . Low HDL (under 40) 07/25/2019  . Alopecia 07/25/2019  . Tachycardia 06/09/2018  . Sinus headache 06/09/2018  . Physical deconditioning 06/09/2018  . Dietary counseling 06/09/2018  . Exercise counseling 06/09/2018  . Sleeping difficulties 01/17/2018  . Contact dermatitis and eczema-bilateral axilla 10/11/2017  . Attention and concentration deficit 08/29/2017  . Carbuncle and furuncle of leg 03/08/2017  . Vitamin D insufficiency 05/22/2016  . Obesity, Class I, BMI 30-34.9 05/22/2016  . Low serum HDL 05/22/2016  . Acute left otitis media 03/03/2016  . Otitis, externa, infective 03/03/2016  . Pharyngitis 03/03/2016  . Fever, unspecified 03/03/2016  . Migraine headache 03/01/2016  . History of ADHD 03/01/2016  . Environmental and seasonal allergies  03/01/2016  . Viral URI 03/01/2016  . h/o Alopecia areata 05/22/2014    History reviewed. No pertinent surgical history.   OB History   No obstetric history on file.     Family History  Problem Relation Age of Onset  . Diabetes Father 26  . Hypertension Father 69  . Depression Brother 10       bipolar  . Cancer Maternal Aunt 45       colon  . Heart attack Paternal Grandmother 12    Social History   Tobacco Use  . Smoking status: Former Smoker    Packs/day: 0.25    Years: 1.00    Pack years: 0.25    Types: Cigarettes    Quit date: 06/12/2010    Years since quitting: 9.4  . Smokeless tobacco: Never Used  Substance Use Topics  . Alcohol use: Yes    Alcohol/week: 1.0 standard drinks    Types: 1 Glasses of wine per week  . Drug use: No    Home Medications Prior to Admission medications   Medication Sig Start Date End Date Taking? Authorizing Provider  aspirin-acetaminophen-caffeine (EXCEDRIN MIGRAINE) [phone] MG tablet Take 1-2 tablets by mouth every 6 (six) hours as needed for headache.    [provider]  Cholecalciferol (VITAMIN D3) 125 MCG (5000 UT) TABS 5,000 IU OTC vitamin D3 daily. Patient taking differently: 2,000 Units. 5,000 IU OTC vitamin D3 daily. 07/29/19   Opalski, Gavin Pound, DO  fexofenadine-pseudoephedrine (ALLEGRA-D ALLERGY & CONGESTION) 180-240 MG 24 hr tablet Take 1 tablet by mouth daily. Patient  taking differently: Take 1 tablet by mouth daily. PRN 03/01/16   Opalski, Neoma Laming, DO  fluticasone (FLONASE) 50 MCG/ACT nasal spray Place 1 spray into both nostrils 2 (two) times daily. 03/01/16   Mellody Dance, DO  Multiple Vitamin (MULTIVITAMIN) capsule Take 1 capsule by mouth daily.    [provider]    Allergies    Rizatriptan  Review of Systems   Review of Systems  Constitutional: Negative for fever.  HENT: Negative for sore throat.   Eyes: Negative for visual disturbance.  Respiratory: Positive for shortness of breath.  Negative for cough.   Cardiovascular: Positive for chest pain. Negative for leg swelling.  Gastrointestinal: Positive for nausea. Negative for abdominal pain and vomiting.  Genitourinary: Negative for difficulty urinating.  Musculoskeletal: Negative for back pain and neck pain.  Skin: Negative for rash.  Neurological: Positive for headaches. Negative for syncope.    Physical Exam Updated Vital Signs BP 112/67   Pulse 84   Temp 97.8 F (36.6 C) (Oral)   Resp 14   Ht 5\' 3"  (1.6 m)   Wt 95.3 kg   LMP 10/31/2019   SpO2 91%   BMI 37.20 kg/m   Physical Exam Vitals and nursing note reviewed.  Constitutional:      General: She is not in acute distress.    Appearance: She is well-developed. She is not diaphoretic.  HENT:     Head: Normocephalic and atraumatic.  Eyes:     Conjunctiva/sclera: Conjunctivae normal.  Cardiovascular:     Rate and Rhythm: Regular rhythm. Tachycardia present.     Heart sounds: Normal heart sounds. No murmur. No friction rub. No gallop.   Pulmonary:     Effort: Pulmonary effort is normal. No respiratory distress.     Breath sounds: Normal breath sounds. No wheezing or rales.  Abdominal:     General: There is no distension.     Palpations: Abdomen is soft.     Tenderness: There is no abdominal tenderness. There is no guarding.  Musculoskeletal:        General: No tenderness.     Cervical back: Normal range of motion.  Skin:    General: Skin is warm and dry.     Findings: No erythema or rash.  Neurological:     Mental Status: She is alert and oriented to person, place, and time.     ED Results / Procedures / Treatments   Labs (all labs ordered are listed, but only abnormal results are displayed) Labs Reviewed  CBC - Abnormal; Notable for the following components:      Result Value   WBC 13.8 (*)    All other components within normal limits  BASIC METABOLIC PANEL  I-STAT BETA HCG BLOOD, ED ([facility], [facility], [facility] ONLY)  TROPONIN I (HIGH SENSITIVITY)    TROPONIN I (HIGH SENSITIVITY)    EKG EKG Interpretation  Date/Time:  Monday November 17 2019 12:30:02 EDT Ventricular Rate:  113 PR Interval:  118 QRS Duration: 84 QT Interval:  316 QTC Calculation: 433 R Axis:   88 Text Interpretation: Sinus tachycardia T wave abnormality, consider inferior ischemia Abnormal ECG No previous ECGs available Confirmed by Gareth Morgan [phone]) on 11/17/2019 9:38:10 PM   Radiology DG Chest 2 View  Result Date: 11/17/2019 CLINICAL DATA:  Pt c/o L sided constant chest pain, worse with deep breathing x 1.5 hours. Pt states walked outside to get something from vehicle and was very short of breath. Pt states pain feels "stabbing" when taking  a deep breath. Pt recently on 18+ hour drive. EXAM: CHEST - 2 VIEW COMPARISON:  None. FINDINGS: The heart size and mediastinal contours are within normal limits. Both lungs are clear. No pleural effusion or pneumothorax. The visualized skeletal structures are unremarkable. IMPRESSION: Normal chest radiographs. Electronically Signed   By: Amie Portland M.D.   On: 11/17/2019 13:10   CT Angio Chest PE W and/or Wo Contrast  Result Date: 11/17/2019 CLINICAL DATA:  Shortness of breath and chest pain EXAM: CT ANGIOGRAPHY CHEST WITH CONTRAST TECHNIQUE: Multidetector CT imaging of the chest was performed using the standard protocol during bolus administration of intravenous contrast. Multiplanar CT image reconstructions and MIPs were obtained to evaluate the vascular anatomy. CONTRAST:  18mL OMNIPAQUE IOHEXOL 350 MG/ML SOLN COMPARISON:  Chest radiograph November 17, 2019 FINDINGS: Cardiovascular: There is no demonstrable pulmonary embolus. There is no thoracic aortic aneurysm or dissection. Visualized great vessels appear normal. No pericardial effusion or pericardial thickening evident. Mediastinum/Nodes: Visualized thyroid appears normal. There is no evident thoracic adenopathy. No esophageal lesions are evident. Lungs/Pleura: There is bibasilar  atelectatic change. There is no edema or airspace opacity. No pleural effusions are evident. Upper Abdomen: Visualized upper abdominal structures appear unremarkable. Musculoskeletal: There are no blastic or lytic bone lesions. No evident chest wall lesions. Review of the MIP images confirms the above findings. IMPRESSION: 1. No demonstrable pulmonary embolus. No thoracic aortic aneurysm or dissection. 2.  Bibasilar atelectasis.  No lung edema or air space opacity. 3.  No evident adenopathy. Electronically Signed   By: Bretta Bang III M.D.   On: 11/17/2019 21:40    Procedures Procedures (including critical care time)  Medications Ordered in ED Medications  sodium chloride flush (NS) 0.9 % injection 3 mL (3 mLs Intravenous Given 11/17/19 2116)  prochlorperazine (COMPAZINE) injection 10 mg (10 mg Intravenous Given 11/17/19 2113)  diphenhydrAMINE (BENADRYL) injection 25 mg (25 mg Intravenous Given 11/17/19 2115)  iohexol (OMNIPAQUE) 350 MG/ML injection 75 mL (75 mLs Intravenous Contrast Given 11/17/19 2124)    ED Course  I have reviewed the triage vital signs and the nursing notes.  Pertinent labs & imaging results that were available during my care of the patient were reviewed by me and considered in my medical decision making (see chart for details).    MDM Rules/Calculators/A&P                       29 year old female with history of elevated triglycerides, presents with concern for chest pain, shortness of breath, lightheadedness in setting of recent travel/driving to [place] and [place] and back.   Differential diagnosis for chest pain includes pulmonary embolus, dissection, pneumothorax, pneumonia, ACS, myocarditis, pericarditis.  EKG was done and evaluate by me and showed no acute ST elevation and no signs of pericarditis, did show tachycardia. Chest x-ray was done and evaluated by me and radiology and showed no sign of pneumonia or pneumothorax.  CT PE study done given high risk travel,  history, tachycardia and shows no evidence of PE.  Patient is low risk HEART score and had delta troponins which were both negative.  Do not feel history or exam are consistent with aortic dissection.  Developed migraine like symptoms while waiting, given headache cocktail with improvement-no headache red flags.  Recommend follow up with PCP. Patient discharged in stable condition with understanding of reasons to return.    Final Clinical Impression(s) / ED Diagnoses Final diagnoses:  Chest pain, unspecified type    Rx /  DC Orders ED Discharge Orders    None       Alvira Monday, MD 11/17/19 2227

## 2019-11-17 NOTE — ED Triage Notes (Signed)
Patient arrives to ED with complaints left sided chest pain that gets worse when she breaths deep. Patient states when she tripods its easier for her to breath. Patient was on long road trip this week.

## 2019-11-17 NOTE — ED Triage Notes (Addendum)
Pt c/o L sided constant chest pain, worse with deep breathing x 1.5 hours. Pt states walked outside to get something from vehicle and was very short of breath. Pt states pain feels "stabbing" when taking a deep breath. Pt recently on 18+ hour drive.

## 2019-11-17 NOTE — ED Notes (Signed)
911 called for transport to ER 

## 2019-11-18 NOTE — ED Provider Notes (Addendum)
[place]   [id number] 11/17/19 Arrival Time: 1105   CC: CHEST PAIN  SUBJECTIVE:  Stefanie White is a 29 y.o. female who presents with complaint of abrupt or gradual chest pain, tachycardia and shortness of breath that began an hour ago. Denies a precipitating event, trauma, recent lower respiratory tract, or strenuous upper body activities. Reports that she did travel recently to [place] and [place]. Localizes chest pain to the substernal region. Describes as worsening and constant sharp in character. Pain is worse with deep breathing. Rates pain as 7/10.  Has not taken OTC medications for this.  Symptoms made worse with deep breathing and coughing. Denies radiating symptoms. Denies previous symptoms in the past. Denies fever, chills, lightheadedness, dizziness, palpitations, nausea, vomiting, abdominal pain, changes in bowel or bladder habits, diaphoresis, numbness/tingling in extremities, peripheral edema, or anxiety.    Denies calf pain or swelling, recent surgery, pregnancy, malignancy, tobacco use, hormone use, or previous blood clot  Denies close relatives with cardiac hx  Previous cardiac testing: electrocardiogram (ECG).  ROS: As per HPI.  All other pertinent ROS negative.    Past Medical History:  Diagnosis Date  . Alopecia    No past surgical history on file. Allergies  Allergen Reactions  . Rizatriptan Swelling and Other (See Comments)    Mouth swelling   No current facility-administered medications on file prior to encounter.   Current Outpatient Medications on File Prior to Encounter  Medication Sig Dispense Refill  . aspirin-acetaminophen-caffeine (EXCEDRIN MIGRAINE) 250-250-65 MG tablet Take 1-2 tablets by mouth every 6 (six) hours as needed for headache.    . Cholecalciferol (VITAMIN D3) 125 MCG (5000 UT) TABS 5,000 IU OTC vitamin D3 daily. (Patient taking differently: 2,000 Units. 5,000 IU OTC vitamin D3 daily.) 90 tablet 3  .  fexofenadine-pseudoephedrine (ALLEGRA-D ALLERGY & CONGESTION) 180-240 MG 24 hr tablet Take 1 tablet by mouth daily. (Patient taking differently: Take 1 tablet by mouth daily. PRN) 90 tablet 1  . fluticasone (FLONASE) 50 MCG/ACT nasal spray Place 1 spray into both nostrils 2 (two) times daily. 16 g 6  . Multiple Vitamin (MULTIVITAMIN) capsule Take 1 capsule by mouth daily.     Social History   Socioeconomic History  . Marital status: Single    Spouse name: Not on file  . Number of children: Not on file  . Years of education: Not on file  . Highest education level: Not on file  Occupational History  . Not on file  Tobacco Use  . Smoking status: Former Smoker    Packs/day: 0.25    Years: 1.00    Pack years: 0.25    Types: Cigarettes    Quit date: 06/12/2010    Years since quitting: 9.4  . Smokeless tobacco: Never Used  Substance and Sexual Activity  . Alcohol use: Yes    Alcohol/week: 1.0 standard drinks    Types: 1 Glasses of wine per week  . Drug use: No  . Sexual activity: Yes    Birth control/protection: Implant  Other Topics Concern  . Not on file  Social History Narrative  . Not on file   Social Determinants of Health   Financial Resource Strain:   . Difficulty of Paying Living Expenses:   Food Insecurity:   . Worried About Charity fundraiser in the Last Year:   . Arboriculturist in the Last Year:   Transportation Needs:   . Film/video editor (Medical):   Marland Kitchen Lack of Transportation (  Non-Medical):   Physical Activity:   . Days of Exercise per Week:   . Minutes of Exercise per Session:   Stress:   . Feeling of Stress :   Social Connections:   . Frequency of Communication with Friends and Family:   . Frequency of Social Gatherings with Friends and Family:   . Attends Religious Services:   . Active Member of Clubs or Organizations:   . Attends Banker Meetings:   Marland Kitchen Marital Status:   Intimate Partner Violence:   . Fear of Current or Ex-Partner:     . Emotionally Abused:   Marland Kitchen Physically Abused:   . Sexually Abused:    Family History  Problem Relation Age of Onset  . Diabetes Father 67  . Hypertension Father 74  . Depression Brother 10       bipolar  . Cancer Maternal Aunt 45       colon  . Heart attack Paternal Grandmother 31     OBJECTIVE:  Vitals:   11/17/19 1135  BP: (!) 150/101  Pulse: (!) 111  Resp: 18  Temp: 98 F (36.7 C)  SpO2: 100%    General appearance: alert; in a cute distress Eyes: PERRLA; EOMI; conjunctiva normal HENT: normocephalic; atraumatic Neck: supple Lungs: clear to auscultation bilaterally without adventitious breath sounds Heart: tachycardic with regular rhythm.  Clear S1 and S2 without rubs, gallops, or murmur. Chest Wall: nontender, no thrills Abdomen: soft, non-tender; bowel sounds normal; no masses or organomegaly; no guarding or rebound tenderness Extremities: no cyanosis or edema; symmetrical with no gross deformities Skin: warm and dry Psychological: alert and cooperative; normal mood and affect  ECG: Orders placed or performed during the hospital encounter of 11/17/19  . ED EKG  . ED EKG    EKG sinus tachycardia without ST elevations, depressions, or prolonged PR interval.  No narrowing or widening of the QRS complexes.  Reviewed EKG with Mardella Layman, MD  LABS:  Results for orders placed or performed in visit on 07/25/19  Comprehensive metabolic panel  Result Value Ref Range   Glucose 88 65 - 99 mg/dL   BUN 9 6 - 20 mg/dL   Creatinine, Ser 2.72 0.57 - 1.00 mg/dL   GFR calc non Af Amer 125 >59 mL/min/1.73   GFR calc Af Amer 144 >59 mL/min/1.73   BUN/Creatinine Ratio 15 9 - 23   Sodium 137 134 - 144 mmol/L   Potassium 4.1 3.5 - 5.2 mmol/L   Chloride 102 96 - 106 mmol/L   CO2 22 20 - 29 mmol/L   Calcium 9.0 8.7 - 10.2 mg/dL   Total Protein 7.3 6.0 - 8.5 g/dL   Albumin 4.1 3.9 - 5.0 g/dL   Globulin, Total 3.2 1.5 - 4.5 g/dL   Albumin/Globulin Ratio 1.3 1.2 - 2.2    Bilirubin Total 0.3 0.0 - 1.2 mg/dL   Alkaline Phosphatase 95 39 - 117 IU/L   AST 26 0 - 40 IU/L   ALT 39 (H) 0 - 32 IU/L  CBC with Differential/Platelet  Result Value Ref Range   WBC 8.6 3.4 - 10.8 x10E3/uL   RBC 4.61 3.77 - 5.28 x10E6/uL   Hemoglobin 13.7 11.1 - 15.9 g/dL   Hematocrit 53.6 64.4 - 46.6 %   MCV 87 79 - 97 fL   MCH 29.7 26.6 - 33.0 pg   MCHC 34.3 31.5 - 35.7 g/dL   RDW 03.4 74.2 - 59.5 %   Platelets 348 150 - 450 x10E3/uL  Neutrophils 60 Not Estab. %   Lymphs 27 Not Estab. %   Monocytes 7 Not Estab. %   Eos 4 Not Estab. %   Basos 1 Not Estab. %   Neutrophils Absolute 5.3 1.4 - 7.0 x10E3/uL   Lymphocytes Absolute 2.3 0.7 - 3.1 x10E3/uL   Monocytes Absolute 0.6 0.1 - 0.9 x10E3/uL   EOS (ABSOLUTE) 0.4 0.0 - 0.4 x10E3/uL   Basophils Absolute 0.1 0.0 - 0.2 x10E3/uL   Immature Granulocytes 1 Not Estab. %   Immature Grans (Abs) 0.0 0.0 - 0.1 x10E3/uL  Hemoglobin A1c  Result Value Ref Range   Hgb A1c MFr Bld 5.6 4.8 - 5.6 %   Est. average glucose Bld gHb Est-mCnc 114 mg/dL  Lipid panel  Result Value Ref Range   Cholesterol, Total 148 100 - 199 mg/dL   Triglycerides 96 0 - 149 mg/dL   HDL 34 (L) >02 mg/dL   VLDL Cholesterol Cal 18 5 - 40 mg/dL   LDL Chol Calc (NIH) 96 0 - 99 mg/dL   Chol/HDL Ratio 4.4 0.0 - 4.4 ratio  VITAMIN D 25 Hydroxy (Vit-D Deficiency, Fractures)  Result Value Ref Range   Vit D, 25-Hydroxy 22.0 (L) 30.0 - 100.0 ng/mL  TSH  Result Value Ref Range   TSH 2.180 0.450 - 4.500 uIU/mL  T4, free  Result Value Ref Range   Free T4 1.02 0.82 - 1.77 ng/dL   Labs Reviewed - No data to display  DIAGNOSTIC STUDIES:    ASSESSMENT & PLAN:   1. Chest pain, unspecified type   2. Tachycardia   3. SOB (shortness of breath)     No orders of the defined types were placed in this encounter.   To emergency department via ambulance. Patient in acute distress EMS activated To ER to rule out PE Chest pain precautions given. Reviewed expectations  about course of current medical issues. Questions answered. Outlined signs and symptoms indicating need for more acute intervention. Patient verbalized understanding. After Visit Summary given.    Moshe Cipro, NP 11/18/19 1504    Moshe Cipro, NP 11/18/19 1505

## 2019-11-18 NOTE — Discharge Instructions (Signed)
Go to the ER for further evaluation and treatment. 

## 2019-11-26 ENCOUNTER — Other Ambulatory Visit: Payer: Self-pay

## 2019-11-26 ENCOUNTER — Encounter: Payer: Self-pay | Admitting: Registered"

## 2019-11-26 ENCOUNTER — Encounter: Payer: No Typology Code available for payment source | Attending: Family Medicine | Admitting: Registered"

## 2019-11-26 DIAGNOSIS — Z713 Dietary counseling and surveillance: Secondary | ICD-10-CM | POA: Insufficient documentation

## 2019-11-26 NOTE — Progress Notes (Signed)
  Medical Nutrition Therapy:  Appt start time: 8:03 end time:  8:18   Assessment:  Primary concerns today:   Pt expectations: learn more healthy eating habits  States she had a good trip home to visit family and friends. States she it took a while to get back into rhythm once returning from vacation. Has established rhythm a week ago related to eating and preparing meals at home. States it took them awhile to go grocery shopping and would eat out. States she went 5 days without soda. It tasted funny when she tried to have it again.   Has started going to grandma's house more often after work. Walks about mile while there, 2x/week.    Preferred Learning Style:   No preference indicated   Learning Readiness:   Ready  Change in progress   MEDICATIONS: See list   DIETARY INTAKE:  Usual eating pattern includes 3 meals and 1-2 snacks per day.  Everyday foods include eggs, grits, bacon, potatoes, steak, chicken, beef, vegetables, candy, and/or popcorn.  Avoided foods include none reported.    24-hr recall:  B (8 AM): [employer] - bacon, egg, gouda sandwich + coffee Snk ( AM):   L (12 PM): cafeteria - hot dog (no bun) + pasta Snk (3:30 PM): granola bar D (6 PM): pork chops + green beans  Snk ( PM):  Beverages: water (40 oz), coffee (1-2 cups)  Usual physical activity: walking (1 mi, 2x/week)   Estimated energy needs: 2000 calories 225 g carbohydrates 150 g protein 56 g fat  Progress Towards Goal(s):  In progress.   Nutritional Diagnosis:  NB-1.1 Food and nutrition-related knowledge deficit As related to having balanced meals.  As evidenced by dietary recall.    Intervention:  Nutrition education and counseling. Pt was encouraged to keep up the great work. Discussed adding vegetables to meals during the day and benefits of physical activity. Pt was in agreement with goals listed.  Goals: - Aim to have vegetables with lunch and dinner.  - Continue to have 3 meals a day +  snacks.  - Aim for at least 3-4 days/week of physical activity. Work towards 5 days/week.  - Keep up the great work!  Teaching Method Utilized:  Visual Auditory Hands on  Handouts given during visit include:  none  Barriers to learning/adherence to lifestyle change: none identified  Demonstrated degree of understanding via: Teach Back   Monitoring/Evaluation:  Dietary intake, exercise, and body weight prn.

## 2019-11-26 NOTE — Patient Instructions (Addendum)
-   Aim to have vegetables with lunch and dinner.   - Continue to have 3 meals a day + snacks.   - Aim for at least 3-4 days/week of physical activity. Work towards 5 days/week.   - Keep up the great work!

## 2020-01-26 ENCOUNTER — Telehealth: Payer: Self-pay | Admitting: Physician Assistant

## 2020-01-26 DIAGNOSIS — L659 Nonscarring hair loss, unspecified: Secondary | ICD-10-CM

## 2020-01-26 NOTE — Telephone Encounter (Signed)
Referral placed.  T. Kristee Angus, CMA 

## 2020-01-26 NOTE — Addendum Note (Signed)
Addended by: Stan Head on: 01/26/2020 04:43 PM   Modules accepted: Orders

## 2020-01-26 NOTE — Telephone Encounter (Signed)
Patient called and left VM saying that she deals with episodes of hair loss/alopecia. She is having another episode the past month and is requesting a derm referral if possible. If approved please place referral for [facility].

## 2020-02-10 MED FILL — CLOMIPHENE CITRATE 50 MG TA: 50 | 5 days supply | Qty: 5 | Fill #0

## 2020-02-27 MED FILL — CLOMIPHENE CITRATE 50 MG TA: 50 | 84 days supply | Qty: 15 | Fill #0

## 2020-06-29 ENCOUNTER — Other Ambulatory Visit (HOSPITAL_COMMUNITY): Payer: Self-pay | Admitting: Obstetrics and Gynecology

## 2020-06-29 MED FILL — PROMETHAZINE 25 MG TABLET: 25 | 10 days supply | Qty: 60 | Fill #0

## 2020-07-05 ENCOUNTER — Other Ambulatory Visit (HOSPITAL_COMMUNITY): Payer: Self-pay | Admitting: Obstetrics

## 2020-07-05 MED FILL — ONDANSETRON ODT 8 MG TABLET: 8 | 10 days supply | Qty: 30 | Fill #0

## 2020-07-26 LAB — OB RESULTS CONSOLE RUBELLA ANTIBODY, IGM: Rubella: NON-IMMUNE/NOT IMMUNE

## 2020-07-26 LAB — OB RESULTS CONSOLE ABO/RH: RH Type: POSITIVE

## 2020-07-26 LAB — OB RESULTS CONSOLE HIV ANTIBODY (ROUTINE TESTING): HIV: NONREACTIVE

## 2020-07-26 LAB — OB RESULTS CONSOLE GC/CHLAMYDIA
Chlamydia: NEGATIVE
Gonorrhea: NEGATIVE

## 2020-07-26 LAB — OB RESULTS CONSOLE RPR: RPR: NONREACTIVE

## 2020-07-26 LAB — OB RESULTS CONSOLE HEPATITIS B SURFACE ANTIGEN: Hepatitis B Surface Ag: NEGATIVE

## 2020-07-26 LAB — OB RESULTS CONSOLE ANTIBODY SCREEN: Antibody Screen: NEGATIVE

## 2020-07-28 ENCOUNTER — Other Ambulatory Visit (HOSPITAL_COMMUNITY): Payer: Self-pay | Admitting: Obstetrics

## 2020-07-28 MED FILL — ONDANSETRON ODT 8 MG TABLET: 8 | 10 days supply | Qty: 30 | Fill #0

## 2020-08-17 ENCOUNTER — Other Ambulatory Visit: Payer: Self-pay

## 2020-08-17 ENCOUNTER — Encounter (HOSPITAL_COMMUNITY): Payer: Self-pay | Admitting: Obstetrics and Gynecology

## 2020-08-17 ENCOUNTER — Inpatient Hospital Stay (HOSPITAL_COMMUNITY)
Admission: AD | Admit: 2020-08-17 | Discharge: 2020-08-17 | Disposition: A | Discharge status: Home / Self Care | Payer: No Typology Code available for payment source | Attending: Obstetrics and Gynecology | Admitting: Obstetrics and Gynecology

## 2020-08-17 DIAGNOSIS — O211 Hyperemesis gravidarum with metabolic disturbance: Secondary | ICD-10-CM | POA: Diagnosis not present

## 2020-08-17 DIAGNOSIS — O210 Mild hyperemesis gravidarum: Secondary | ICD-10-CM

## 2020-08-17 DIAGNOSIS — Z3A13 13 weeks gestation of pregnancy: Secondary | ICD-10-CM | POA: Diagnosis not present

## 2020-08-17 DIAGNOSIS — E860 Dehydration: Secondary | ICD-10-CM | POA: Diagnosis not present

## 2020-08-17 DIAGNOSIS — Z7982 Long term (current) use of aspirin: Secondary | ICD-10-CM | POA: Insufficient documentation

## 2020-08-17 DIAGNOSIS — Z87891 Personal history of nicotine dependence: Secondary | ICD-10-CM | POA: Diagnosis not present

## 2020-08-17 LAB — URINALYSIS, ROUTINE W REFLEX MICROSCOPIC
Bacteria, UA: NONE SEEN
Bilirubin Urine: NEGATIVE
Glucose, UA: NEGATIVE mg/dL
Hgb urine dipstick: NEGATIVE
Ketones, ur: 5 mg/dL — AB
Nitrite: NEGATIVE
Protein, ur: NEGATIVE mg/dL
Specific Gravity, Urine: 1.027 (ref 1.005–1.030)
pH: 5 (ref 5.0–8.0)

## 2020-08-17 LAB — CBC
HCT: 35.1 % — ABNORMAL LOW (ref 36.0–46.0)
Hemoglobin: 12.5 g/dL (ref 12.0–15.0)
MCH: 30.3 pg (ref 26.0–34.0)
MCHC: 35.6 g/dL (ref 30.0–36.0)
MCV: 85.2 fL (ref 80.0–100.0)
Platelets: 285 10*3/uL (ref 150–400)
RBC: 4.12 MIL/uL (ref 3.87–5.11)
RDW: 12.2 % (ref 11.5–15.5)
WBC: 11.1 10*3/uL — ABNORMAL HIGH (ref 4.0–10.5)
nRBC: 0 % (ref 0.0–0.2)

## 2020-08-17 LAB — BASIC METABOLIC PANEL
Anion gap: 10 (ref 5–15)
BUN: <5 mg/dL — ABNORMAL LOW (ref 6–20)
CO2: 21 mmol/L — ABNORMAL LOW (ref 22–32)
Calcium: 9.2 mg/dL (ref 8.9–10.3)
Chloride: 102 mmol/L (ref 98–111)
Creatinine, Ser: 0.51 mg/dL (ref 0.44–1.00)
GFR, Estimated: >60 mL/min (ref >60)
Glucose, Bld: 81 mg/dL (ref 70–99)
Potassium: 3.9 mmol/L (ref 3.5–5.1)
Sodium: 133 mmol/L — ABNORMAL LOW (ref 135–145)

## 2020-08-17 MED ORDER — LACTATED RINGERS IV BOLUS
1000.0000 mL | Freq: Once | INTRAVENOUS | Status: AC
  Administered 2020-08-17: 1000 mL via INTRAVENOUS

## 2020-08-17 MED ORDER — METOCLOPRAMIDE HCL 5 MG/ML IJ SOLN
10.0000 mg | Freq: Once | INTRAMUSCULAR | Status: AC
  Administered 2020-08-17: 10 mg via INTRAVENOUS
  Filled 2020-08-17: qty 2

## 2020-08-17 NOTE — Discharge Instructions (Signed)
Morning Sickness  Morning sickness is when you feel like you may vomit (feel nauseous) during pregnancy. Sometimes, you may vomit. Morning sickness most often happens in the morning, but it can also happen at any time of the day. Some women may have morning sickness that makes them vomit all the time. This is a more serious problem that needs treatment. What are the causes? The cause of this condition is not known. What increases the risk?  You had vomiting or a feeling like you may vomit before your pregnancy.  You had morning sickness in another pregnancy.  You are pregnant with more than one baby, such as twins. What are the signs or symptoms?  Feeling like you may vomit.  Vomiting. How is this treated? Treatment is usually not needed for this condition. You may only need to change what you eat. In some cases, your doctor may give you some things to take for your condition. These include:  Vitamin B6 supplements.  Medicines to treat the feeling that you may vomit.  Ginger. Follow these instructions at home: Medicines  Take over-the-counter and prescription medicines only as told by your doctor. Do not take any medicines until you talk with your doctor about them first.  Take multivitamins before you get pregnant. These can stop or lessen the symptoms of morning sickness. Eating and drinking  Eat dry toast or crackers before getting out of bed.  Eat 5 or 6 small meals a day.  Eat dry and bland foods like rice and baked potatoes.  Do not eat greasy, fatty, or spicy foods.  Have someone cook for you if the smell of food causes you to vomit or to feel like you may vomit.  If you feel like you may vomit after taking prenatal vitamins, take them at night or with a snack.  Eat protein foods when you need a snack. Nuts, yogurt, and cheese are good choices.  Drink fluids throughout the day.  Try ginger ale made with real ginger, ginger tea made from fresh grated ginger, or  ginger candies. General instructions  Do not smoke or use any products that contain nicotine or tobacco. If you need help quitting, ask your doctor.  Use an air purifier to keep the air in your house free of smells.  Get lots of fresh air.  Try to avoid smells that make you feel sick.  Try wearing an acupressure wristband. This is a wristband that is used to treat seasickness.  Try a treatment called acupuncture. In this treatment, a doctor puts needles into certain areas of your body to make you feel better. Contact a doctor if:  You need medicine to feel better.  You feel dizzy or light-headed.  You are losing weight. Get help right away if:  The feeling that you may vomit will not go away, or you cannot stop vomiting.  You faint.  You have very bad pain in your belly. Summary  Morning sickness is when you feel like you may vomit (feel nauseous) during pregnancy.  You may feel sick in the morning, but you can feel this way at any time of the day.  Making some changes to what you eat may help your symptoms go away. This information is not intended to replace advice given to you by your health care provider. Make sure you discuss any questions you have with your health care provider. Document Revised: 01/12/2020 Document Reviewed: 12/22/2019 Elsevier Patient Education  2021 [employer].  Safe Medications in Pregnancy    Acne: Benzoyl Peroxide Salicylic Acid  Backache/Headache: Tylenol: 2 regular strength every 4 hours OR              2 Extra strength every 6 hours  Colds/Coughs/Allergies: Benadryl (alcohol free) 25 mg every 6 hours as needed Breath right strips Claritin Cepacol throat lozenges Chloraseptic throat spray Cold-Eeze- up to three times per day Cough drops, alcohol free Flonase (by prescription only) Guaifenesin Mucinex Robitussin DM (plain only, alcohol free) Saline nasal spray/drops Sudafed (pseudoephedrine) & Actifed **  use only after [redacted] weeks gestation and if you do not have high blood pressure Tylenol Vicks Vaporub Zinc lozenges Zyrtec   Constipation: Colace Ducolax suppositories Fleet enema Glycerin suppositories Metamucil Milk of magnesia Miralax Senokot Smooth move tea  Diarrhea: Kaopectate Imodium A-D  *NO pepto Bismol  Hemorrhoids: Anusol Anusol HC Preparation H Tucks  Indigestion: Tums Maalox Mylanta Zantac  Pepcid  Insomnia: Benadryl (alcohol free) 25mg  every 6 hours as needed Tylenol PM Unisom, no Gelcaps  Leg Cramps: Tums MagGel  Nausea/Vomiting:  Bonine Dramamine Emetrol Ginger extract Sea bands Meclizine  Nausea medication to take during pregnancy:  Unisom (doxylamine succinate 25 mg tablets) Take one tablet daily at bedtime. If symptoms are not adequately controlled, the dose can be increased to a maximum recommended dose of two tablets daily (1/2 tablet in the morning, 1/2 tablet mid-afternoon and one at bedtime). Vitamin B6 100mg  tablets. Take one tablet twice a day (up to 200 mg per day).  Skin Rashes: Aveeno products Benadryl cream or 25mg  every 6 hours as needed Calamine Lotion 1% cortisone cream  Yeast infection: Gyne-lotrimin 7 Monistat 7   **If taking multiple medications, please check labels to avoid duplicating the same active ingredients **take medication as directed on the label ** Do not exceed 4000 mg of tylenol in 24 hours **Do not take medications that contain aspirin or ibuprofen

## 2020-08-17 NOTE — MAU Note (Signed)
Presents with c/o dizziness, seeing spots that began this morning while @ work.  Also reports N/V that began this morning, has vomited 3x.  Took Zofran @ approx 0900, unsure it stayed down d/t vomiting.  Denies VB or LOF.

## 2020-08-17 NOTE — MAU Provider Note (Addendum)
History     CSN: [id number]  Arrival date and time: 08/17/20 1011   Event Date/Time   First Provider Initiated Contact with Patient 08/17/20 1100      Chief Complaint  Patient presents with  . Nausea  . Emesis  . Dizziness   30 y.o. G1 @13 .6 wks presenting with N/V and dizziness. Reports 3 episodes of N/V today. Afterward she felt dizzy, no syncope. She took Zofran and had no emesis since but still feels nausea. Reports daily nausea. Has vomiting some days. Denies diarrhea. No and pain or VB.   OB History    Gravida  1   Para      Term      Preterm      AB      Living        SAB      IAB      Ectopic      Multiple      Live Births              Past Medical History:  Diagnosis Date  . Alopecia     Past Surgical History:  Procedure Laterality Date  . NO PAST SURGERIES      Family History  Problem Relation Age of Onset  . Diabetes Father 59  . Hypertension Father 19  . Depression Brother 10       bipolar  . Cancer Maternal Aunt 45       colon  . Heart attack Paternal Grandmother 74  . Kidney Stones Mother     Social History   Tobacco Use  . Smoking status: Former Smoker    Packs/day: 0.25    Years: 1.00    Pack years: 0.25    Types: Cigarettes    Quit date: 06/12/2010    Years since quitting: 10.1  . Smokeless tobacco: Never Used  Vaping Use  . Vaping Use: Never used  Substance Use Topics  . Alcohol use: Yes    Alcohol/week: 1.0 standard drink    Types: 1 Glasses of wine per week  . Drug use: No    Allergies:  Allergies  Allergen Reactions  . Rizatriptan Swelling and Other (See Comments)    Mouth swelling    No medications prior to admission.    Review of Systems  Constitutional: Negative for chills and fever.  Gastrointestinal: Positive for nausea and vomiting. Negative for abdominal pain, constipation and diarrhea.  Genitourinary: Negative for vaginal bleeding.   Physical Exam   Blood pressure 109/63, pulse 82,  temperature 97.9 F (36.6 C), temperature source Oral, resp. rate 18, height 5\' 3"  (1.6 m), weight 88.6 kg, last menstrual period 05/05/2020, SpO2 100 %.  Physical Exam Vitals and nursing note reviewed.  Constitutional:      Appearance: Normal appearance.  HENT:     Head: Normocephalic and atraumatic.  Pulmonary:     Effort: Pulmonary effort is normal. No respiratory distress.  Musculoskeletal:        General: Normal range of motion.     Cervical back: Normal range of motion.  Neurological:     General: No focal deficit present.     Mental Status: She is alert and oriented to person, place, and time.  Psychiatric:        Mood and Affect: Mood normal.        Behavior: Behavior normal.   FHT 151  Results for orders placed or performed during the hospital encounter of 08/17/20 (from the past  24 hour(s))  Urinalysis, Routine w reflex microscopic Urine, Clean Catch     Status: Abnormal   Collection Time: 08/17/20 10:42 AM  Result Value Ref Range   Color, Urine YELLOW YELLOW   APPearance CLOUDY (A) CLEAR   Specific Gravity, Urine 1.027 1.005 - 1.030   pH 5.0 5.0 - 8.0   Glucose, UA NEGATIVE NEGATIVE mg/dL   Hgb urine dipstick NEGATIVE NEGATIVE   Bilirubin Urine NEGATIVE NEGATIVE   Ketones, ur 5 (A) NEGATIVE mg/dL   Protein, ur NEGATIVE NEGATIVE mg/dL   Nitrite NEGATIVE NEGATIVE   Leukocytes,Ua TRACE (A) NEGATIVE   RBC / HPF 0-5 0 - 5 RBC/hpf   WBC, UA 6-10 0 - 5 WBC/hpf   Bacteria, UA NONE SEEN NONE SEEN   Squamous Epithelial / LPF 6-10 0 - 5   Mucus PRESENT    Ca Oxalate Crys, UA PRESENT   CBC     Status: Abnormal   Collection Time: 08/17/20 12:30 PM  Result Value Ref Range   WBC 11.1 (H) 4.0 - 10.5 K/uL   RBC 4.12 3.87 - 5.11 MIL/uL   Hemoglobin 12.5 12.0 - 15.0 g/dL   HCT 38.7 (L) 56.4 - 33.2 %   MCV 85.2 80.0 - 100.0 fL   MCH 30.3 26.0 - 34.0 pg   MCHC 35.6 30.0 - 36.0 g/dL   RDW 95.1 88.4 - 16.6 %   Platelets 285 150 - 400 K/uL   nRBC 0.0 0.0 - 0.2 %  Basic  metabolic panel     Status: Abnormal   Collection Time: 08/17/20 12:30 PM  Result Value Ref Range   Sodium 133 (L) 135 - 145 mmol/L   Potassium 3.9 3.5 - 5.1 mmol/L   Chloride 102 98 - 111 mmol/L   CO2 21 (L) 22 - 32 mmol/L   Glucose, Bld 81 70 - 99 mg/dL   BUN <5 (L) 6 - 20 mg/dL   Creatinine, Ser 0.63 0.44 - 1.00 mg/dL   Calcium 9.2 8.9 - 01.6 mg/dL   GFR, Estimated >01 >09 mL/min   Anion gap 10 5 - 15   MAU Course  Procedures LR Reglan  MDM Labs ordered and reviewed. No emesis while here. Feeling better. Tolerating po. Discussed starting OTC Diclegis regimen. Stable for discharge home.   Assessment and Plan   1. [redacted] weeks gestation of pregnancy   2. Morning sickness   3. Dehydration    Discharge home [facility] as scheduled Maintain hydration B6 & Unisom OTC Continue Zofran  Allergies as of 08/17/2020      Reactions   Rizatriptan Swelling, Other (See Comments)   Mouth swelling      Medication List    STOP taking these medications   aspirin-acetaminophen-caffeine 250-250-65 MG tablet Commonly known as: EXCEDRIN MIGRAINE   fexofenadine-pseudoephedrine 180-240 MG 24 hr tablet Commonly known as: Allegra-D Allergy & Congestion   multivitamin capsule   PRENATAL ADULT GUMMY/DHA/FA PO     TAKE these medications   docusate sodium 100 MG capsule Commonly known as: COLACE Take 100 mg by mouth daily.   fluticasone 50 MCG/ACT nasal spray Commonly known as: FLONASE Place 1 spray into both nostrils 2 (two) times daily.   multivitamin-prenatal 27-0.8 MG Tabs tablet Take 1 tablet by mouth daily at 12 noon.   ondansetron 8 MG disintegrating tablet Commonly known as: ZOFRAN-ODT Take 8 mg by mouth every 8 (eight) hours as needed for nausea or vomiting.   Vitamin D3 125  MCG (5000 UT) Tabs 5,000 IU OTC vitamin D3 daily. What changed: how much to take      Donette Larry, CNM 08/17/2020, 3:41 PM

## 2020-08-31 ENCOUNTER — Other Ambulatory Visit: Payer: Self-pay | Admitting: Obstetrics and Gynecology

## 2020-08-31 DIAGNOSIS — Z363 Encounter for antenatal screening for malformations: Secondary | ICD-10-CM

## 2020-08-31 DIAGNOSIS — O99212 Obesity complicating pregnancy, second trimester: Secondary | ICD-10-CM

## 2020-09-08 ENCOUNTER — Encounter: Payer: Self-pay | Admitting: Physician Assistant

## 2020-09-14 ENCOUNTER — Inpatient Hospital Stay (HOSPITAL_COMMUNITY)
Admission: AD | Admit: 2020-09-14 | Discharge: 2020-09-14 | Disposition: A | Discharge status: Home / Self Care | Payer: No Typology Code available for payment source | Attending: Obstetrics and Gynecology | Admitting: Obstetrics and Gynecology

## 2020-09-14 ENCOUNTER — Encounter (HOSPITAL_COMMUNITY): Payer: Self-pay | Admitting: Obstetrics and Gynecology

## 2020-09-14 ENCOUNTER — Other Ambulatory Visit: Payer: Self-pay

## 2020-09-14 ENCOUNTER — Other Ambulatory Visit (HOSPITAL_COMMUNITY): Payer: Self-pay

## 2020-09-14 DIAGNOSIS — Z7982 Long term (current) use of aspirin: Secondary | ICD-10-CM | POA: Insufficient documentation

## 2020-09-14 DIAGNOSIS — J329 Chronic sinusitis, unspecified: Secondary | ICD-10-CM | POA: Diagnosis not present

## 2020-09-14 DIAGNOSIS — O99512 Diseases of the respiratory system complicating pregnancy, second trimester: Secondary | ICD-10-CM | POA: Diagnosis not present

## 2020-09-14 DIAGNOSIS — Z87891 Personal history of nicotine dependence: Secondary | ICD-10-CM | POA: Insufficient documentation

## 2020-09-14 DIAGNOSIS — R0602 Shortness of breath: Secondary | ICD-10-CM | POA: Diagnosis present

## 2020-09-14 DIAGNOSIS — Z3A17 17 weeks gestation of pregnancy: Secondary | ICD-10-CM | POA: Diagnosis not present

## 2020-09-14 DIAGNOSIS — J310 Chronic rhinitis: Secondary | ICD-10-CM

## 2020-09-14 DIAGNOSIS — J069 Acute upper respiratory infection, unspecified: Secondary | ICD-10-CM | POA: Insufficient documentation

## 2020-09-14 DIAGNOSIS — Z8616 Personal history of COVID-19: Secondary | ICD-10-CM

## 2020-09-14 HISTORY — DX: Other seasonal allergic rhinitis: J30.2

## 2020-09-14 HISTORY — DX: Acute upper respiratory infection, unspecified: J06.9

## 2020-09-14 LAB — URINALYSIS, ROUTINE W REFLEX MICROSCOPIC
Bilirubin Urine: NEGATIVE
Glucose, UA: NEGATIVE mg/dL
Ketones, ur: NEGATIVE mg/dL
Nitrite: NEGATIVE
Protein, ur: NEGATIVE mg/dL
Specific Gravity, Urine: 1.005 (ref 1.005–1.030)
pH: 5 (ref 5.0–8.0)

## 2020-09-14 MED ORDER — ALBUTEROL SULFATE HFA 108 (90 BASE) MCG/ACT IN AERS
2.0000 | INHALATION_SPRAY | RESPIRATORY_TRACT | 2 refills | Status: DC | PRN
  Filled 2020-09-14 (×2): qty 8.5, 17d supply, fill #0

## 2020-09-14 NOTE — MAU Note (Signed)
Stefanie White is a 30 y.o. at [redacted]w[redacted]d here in MAU reporting: URI symptoms since Monday morning. Has had a cough and congestion and feels SOB when walking. Called the office was told to come in. No vaginal bleeding or LOF. States she gets these symptoms every spring but this time seems worse.   Onset of complaint: yesterday  Pain score: 0/10  Vitals:   09/14/20 1534  BP: 132/76  Pulse: 97  Resp: (!) 28  Temp: 98 F (36.7 C)  SpO2: 97%     [id number]  Lab orders placed from triage: UA

## 2020-09-14 NOTE — Discharge Instructions (Signed)
Viral Respiratory Infection A respiratory infection is an illness that affects part of the respiratory system, such as the lungs, nose, or throat. A respiratory infection that is caused by a virus is called a viral respiratory infection. Common types of viral respiratory infections include:  A cold.  The flu (influenza).  A respiratory syncytial virus (RSV) infection. What are the causes? This condition is caused by a virus. What are the signs or symptoms? Symptoms of this condition include:  A stuffy or runny nose.  Yellow or green nasal discharge.  A cough.  Sneezing.  Fatigue.  Achy muscles.  A sore throat.  Sweating or chills.  A fever.  A headache. How is this diagnosed? This condition may be diagnosed based on:  Your symptoms.  A physical exam.  Testing of nasal swabs. How is this treated? This condition may be treated with medicines, such as:  Antiviral medicine. This may shorten the length of time a person has symptoms.  Expectorants. These make it easier to cough up mucus.  Decongestant nasal sprays.  Acetaminophen or NSAIDs to relieve fever and pain. Antibiotic medicines are not prescribed for viral infections. This is because antibiotics are designed to kill bacteria. They are not effective against viruses.     Follow these instructions at home: Managing pain and congestion  Take over-the-counter and prescription medicines only as told by your health care provider.  If you have a sore throat, gargle with a salt-water mixture 3-4 times a day or as needed. To make a salt-water mixture, completely dissolve -1 tsp of salt in 1 cup of warm water.  Use nose drops made from salt water to ease congestion and soften raw skin around your nose.  Drink enough fluid to keep your urine pale yellow. This helps prevent dehydration and helps loosen up mucus. General instructions  Rest as much as possible.  Do not drink alcohol.  Do not use any products  that contain nicotine or tobacco, such as cigarettes and e-cigarettes. If you need help quitting, ask your health care provider.  Keep all follow-up visits as told by your health care provider. This is important.    How is this prevented?  Get an annual flu shot. You may get the flu shot in late summer, fall, or winter. Ask your health care provider when you should get your flu shot.  Avoid exposing others to your respiratory infection. ? Stay home from work or school as told by your health care provider. ? Wash your hands with soap and water often, especially after you cough or sneeze. If soap and water are not available, use alcohol-based hand sanitizer.  Avoid contact with people who are sick during cold and flu season. This is generally fall and winter.    Contact a health care provider if:  Your symptoms last for 10 days or longer.  Your symptoms get worse over time.  You have a fever.  You have severe sinus pain in your face or forehead.  The glands in your jaw or neck become very swollen. Get help right away if you:  Feel pain or pressure in your chest.  Faint or feel like you will faint.  Have severe and persistent vomiting.  Feel confused or disoriented. Summary  A respiratory infection is an illness that affects part of the respiratory system, such as the lungs, nose, or throat. A respiratory infection that is caused by a virus is called a viral respiratory infection.  Common types of viral respiratory  infections are a cold, influenza, and respiratory syncytial virus (RSV) infection.  Symptoms of this condition include a stuffy or runny nose, cough, sneezing, fatigue, achy muscles, sore throat, and fevers or chills.  Antibiotic medicines are not prescribed for viral infections. This is because antibiotics are designed to kill bacteria. They are not effective against viruses. This information is not intended to replace advice given to you by your health care  provider. Make sure you discuss any questions you have with your health care provider. Document Revised: 06/06/2018 Document Reviewed: 07/09/2017 Elsevier Patient Education  2021 [employer].

## 2020-09-14 NOTE — MAU Provider Note (Signed)
History     CSN: [id number]  Arrival date and time: 09/14/20 1521   Event Date/Time   First Provider Initiated Contact with Stefanie White 09/14/20 1608      Chief Complaint  Stefanie White presents with  . Shortness of Breath   HPI   Stefanie White is a 30 y/o g1p0 at [redacted]w[redacted]d presenting for congestion and SOB. Stefanie White has a history of Covid at the end of December/early January. She reports symptom onset on Saturday with symptoms of runny nose, congestion, cough, fatigue. Denies fevers. She reports that she gets this type of sickness at this time every year, but felt like it hit her harder this year. She reports that she walks quite a bit at work and is feeling short of breath when she is walking. She is taking pseudofed, robitussin, claritin, and flonase.   She reports that she got pretty knocked out from Covid and that it left her feeling fatigued and short of breath for quite some time before she fully recovered.     OB History    Gravida  1   Para      Term      Preterm      AB      Living        SAB      IAB      Ectopic      Multiple      Live Births              Past Medical History:  Diagnosis Date  . Alopecia   . Recurrent upper respiratory infection (URI)   . Seasonal allergies     Past Surgical History:  Procedure Laterality Date  . NO PAST SURGERIES      Family History  Problem Relation Age of Onset  . Diabetes Father 100  . Hypertension Father 66  . Depression Brother 10       bipolar  . Cancer Maternal Aunt 45       colon  . Heart attack Paternal Grandmother 45  . Kidney Stones Mother     Social History   Tobacco Use  . Smoking status: Former Smoker    Packs/day: 0.25    Years: 1.00    Pack years: 0.25    Types: Cigarettes    Quit date: 06/12/2010    Years since quitting: 10.2  . Smokeless tobacco: Never Used  Vaping Use  . Vaping Use: Never used  Substance Use Topics  . Alcohol use: Yes    Alcohol/week: 1.0 standard drink    Types: 1  Glasses of wine per week  . Drug use: No    Allergies:  Allergies  Allergen Reactions  . Rizatriptan Swelling and Other (See Comments)    Mouth swelling    Medications Prior to Admission  Medication Sig Dispense Refill Last Dose  . aspirin 81 MG chewable tablet Chew by mouth daily.   09/14/2020 at Unknown time  . docusate sodium (COLACE) 100 MG capsule Take 100 mg by mouth daily.   Past Week at Unknown time  . fluticasone (FLONASE) 50 MCG/ACT nasal spray Place 1 spray into both nostrils 2 (two) times daily. 16 g 6 09/14/2020 at Unknown time  . guaiFENesin-dextromethorphan (ROBITUSSIN DM) 100-10 MG/5ML syrup Take 5 mLs by mouth every 4 (four) hours as needed for cough.   09/14/2020 at Unknown time  . loratadine (CLARITIN) 10 MG tablet Take 10 mg by mouth daily.   09/14/2020 at Unknown time  . ondansetron (  ZOFRAN-ODT) 8 MG disintegrating tablet DISSOLVE 1 TABLET BY MOUTH EVERY 8 HOURS AS NEEDED 30 tablet 1 Past Week at Unknown time  . phenylephrine (SUDAFED PE) 10 MG TABS tablet Take 10 mg by mouth every 4 (four) hours as needed.   09/14/2020 at Unknown time  . Prenatal Vit-Fe Fumarate-FA (MULTIVITAMIN-PRENATAL) 27-0.8 MG TABS tablet Take 1 tablet by mouth daily at 12 noon.   09/14/2020 at Unknown time  . promethazine (PHENERGAN) 25 MG tablet TAKE 1 TABLET BY MOUTH EVERY 4 HOURS 60 tablet 4 Past Month at Unknown time  . ondansetron (ZOFRAN-ODT) 8 MG disintegrating tablet Take 8 mg by mouth every 8 (eight) hours as needed for nausea or vomiting.     . ondansetron (ZOFRAN-ODT) 8 MG disintegrating tablet DISSOLVE 1 TABLET BY MOUTH EVERY 8 HOURS 30 tablet 0     Review of Systems  Constitutional: Positive for fatigue. Negative for fever.  HENT: Positive for congestion, rhinorrhea and sinus pressure. Negative for sinus pain.   Respiratory: Positive for cough and shortness of breath.   Cardiovascular: Negative for chest pain.  Gastrointestinal: Negative for abdominal pain.  Neurological: Negative for  dizziness and headaches.   Physical Exam   Blood pressure 132/76, pulse 97, temperature 98 F (36.7 C), temperature source Oral, resp. rate (!) 28, height 5\' 3"  (1.6 m), weight 88.3 kg, last menstrual period 05/05/2020, SpO2 97 %.  Physical Exam Vitals and nursing note reviewed.  Constitutional:      General: She is not in acute distress.    Appearance: She is well-developed. She is not diaphoretic.     Comments: Mild TTP of b/l maxillary sinuses, rhinorrhea present   HENT:     Head: Normocephalic and atraumatic.  Cardiovascular:     Rate and Rhythm: Normal rate.  Pulmonary:     Effort: Pulmonary effort is normal. No tachypnea or respiratory distress.     Breath sounds: Normal breath sounds.     Comments: Cough is present Neurological:     Mental Status: She is alert.  Psychiatric:        Mood and Affect: Mood normal.        Behavior: Behavior normal.     MAU Course  Procedures  MDM Pt evaluated at bedside  Pt informed that the ultrasound is considered a limited OB ultrasound and is not intended to be a complete ultrasound exam.  Stefanie White also informed that the ultrasound is not being completed with the intent of assessing for fetal or placental anomalies or any pelvic abnormalities.  Explained that the purpose of today's ultrasound is to assess for  viability.  Stefanie White acknowledges the purpose of the exam and the limitations of the study.    Maintained O2 >98% on ambulation  Normal VS, normal pulmonary exam.   Discussed w Stefanie White, stable for d/c home    Assessment and Plan   Viral URI, rhinosinusitis -likely feels worse this time around in setting of being pregnant, recent covid infection  -discussed OTC treatments and measures at home, rest, hydration -sent with script for albuterol inhaler prn -provided w return precatuions, Stefanie White verbalizes understanding    05/07/2020 09/14/2020, 4:39 PM

## 2020-09-21 ENCOUNTER — Ambulatory Visit: Payer: No Typology Code available for payment source | Admitting: *Deleted

## 2020-09-21 ENCOUNTER — Other Ambulatory Visit: Payer: Self-pay | Admitting: *Deleted

## 2020-09-21 ENCOUNTER — Other Ambulatory Visit: Payer: Self-pay

## 2020-09-21 ENCOUNTER — Ambulatory Visit: Payer: No Typology Code available for payment source | Attending: Obstetrics and Gynecology

## 2020-09-21 ENCOUNTER — Encounter: Payer: Self-pay | Admitting: *Deleted

## 2020-09-21 VITALS — BP 107/59 | HR 77

## 2020-09-21 DIAGNOSIS — E669 Obesity, unspecified: Secondary | ICD-10-CM

## 2020-09-21 DIAGNOSIS — O99212 Obesity complicating pregnancy, second trimester: Secondary | ICD-10-CM | POA: Diagnosis not present

## 2020-09-21 DIAGNOSIS — F909 Attention-deficit hyperactivity disorder, unspecified type: Secondary | ICD-10-CM

## 2020-09-21 DIAGNOSIS — Z363 Encounter for antenatal screening for malformations: Secondary | ICD-10-CM | POA: Insufficient documentation

## 2020-09-21 DIAGNOSIS — G43909 Migraine, unspecified, not intractable, without status migrainosus: Secondary | ICD-10-CM

## 2020-09-21 DIAGNOSIS — O99342 Other mental disorders complicating pregnancy, second trimester: Secondary | ICD-10-CM

## 2020-09-21 DIAGNOSIS — O09892 Supervision of other high risk pregnancies, second trimester: Secondary | ICD-10-CM

## 2020-09-21 DIAGNOSIS — O99352 Diseases of the nervous system complicating pregnancy, second trimester: Secondary | ICD-10-CM

## 2020-09-21 DIAGNOSIS — Z6837 Body mass index (BMI) 37.0-37.9, adult: Secondary | ICD-10-CM | POA: Diagnosis present

## 2020-09-21 DIAGNOSIS — O321XX0 Maternal care for breech presentation, not applicable or unspecified: Secondary | ICD-10-CM

## 2020-09-21 DIAGNOSIS — Z3A18 18 weeks gestation of pregnancy: Secondary | ICD-10-CM

## 2020-09-21 DIAGNOSIS — R638 Other symptoms and signs concerning food and fluid intake: Secondary | ICD-10-CM

## 2020-09-21 DIAGNOSIS — B069 Rubella without complication: Secondary | ICD-10-CM

## 2020-10-26 ENCOUNTER — Other Ambulatory Visit (HOSPITAL_COMMUNITY): Payer: Self-pay

## 2020-10-26 MED ORDER — ONDANSETRON 8 MG PO TBDP
ORAL_TABLET | ORAL | 1 refills | Status: DC
  Filled 2020-10-26: qty 30, 10d supply, fill #0

## 2020-10-27 ENCOUNTER — Ambulatory Visit: Payer: No Typology Code available for payment source | Admitting: *Deleted

## 2020-10-27 ENCOUNTER — Ambulatory Visit: Payer: No Typology Code available for payment source | Attending: Maternal & Fetal Medicine

## 2020-10-27 ENCOUNTER — Other Ambulatory Visit: Payer: Self-pay | Admitting: Maternal & Fetal Medicine

## 2020-10-27 ENCOUNTER — Encounter: Payer: Self-pay | Admitting: *Deleted

## 2020-10-27 ENCOUNTER — Other Ambulatory Visit: Payer: Self-pay

## 2020-10-27 VITALS — BP 123/67 | HR 112

## 2020-10-27 DIAGNOSIS — O99212 Obesity complicating pregnancy, second trimester: Secondary | ICD-10-CM | POA: Diagnosis not present

## 2020-10-27 DIAGNOSIS — O09892 Supervision of other high risk pregnancies, second trimester: Secondary | ICD-10-CM

## 2020-10-27 DIAGNOSIS — E669 Obesity, unspecified: Secondary | ICD-10-CM | POA: Diagnosis not present

## 2020-10-27 DIAGNOSIS — O99342 Other mental disorders complicating pregnancy, second trimester: Secondary | ICD-10-CM

## 2020-10-27 DIAGNOSIS — B069 Rubella without complication: Secondary | ICD-10-CM

## 2020-10-27 DIAGNOSIS — F909 Attention-deficit hyperactivity disorder, unspecified type: Secondary | ICD-10-CM

## 2020-10-27 DIAGNOSIS — O4440 Low lying placenta NOS or without hemorrhage, unspecified trimester: Secondary | ICD-10-CM | POA: Diagnosis present

## 2020-10-27 DIAGNOSIS — O99352 Diseases of the nervous system complicating pregnancy, second trimester: Secondary | ICD-10-CM

## 2020-10-27 DIAGNOSIS — R638 Other symptoms and signs concerning food and fluid intake: Secondary | ICD-10-CM | POA: Insufficient documentation

## 2020-10-27 DIAGNOSIS — O99213 Obesity complicating pregnancy, third trimester: Secondary | ICD-10-CM

## 2020-10-27 DIAGNOSIS — G43909 Migraine, unspecified, not intractable, without status migrainosus: Secondary | ICD-10-CM

## 2020-10-27 DIAGNOSIS — Z3A24 24 weeks gestation of pregnancy: Secondary | ICD-10-CM

## 2020-11-11 ENCOUNTER — Other Ambulatory Visit (HOSPITAL_COMMUNITY): Payer: Self-pay

## 2020-11-11 MED ORDER — CARESTART COVID-19 HOME TEST VI KIT
PACK | 0 refills | Status: DC
  Filled 2020-11-11: qty 4, 4d supply, fill #0

## 2020-12-01 ENCOUNTER — Ambulatory Visit: Payer: No Typology Code available for payment source

## 2020-12-01 ENCOUNTER — Other Ambulatory Visit: Payer: Self-pay

## 2020-12-01 ENCOUNTER — Ambulatory Visit: Payer: No Typology Code available for payment source | Attending: Maternal & Fetal Medicine | Admitting: *Deleted

## 2020-12-01 ENCOUNTER — Ambulatory Visit (HOSPITAL_BASED_OUTPATIENT_CLINIC_OR_DEPARTMENT_OTHER): Payer: No Typology Code available for payment source

## 2020-12-01 ENCOUNTER — Encounter: Payer: Self-pay | Admitting: *Deleted

## 2020-12-01 VITALS — BP 128/61 | HR 97

## 2020-12-01 DIAGNOSIS — O99353 Diseases of the nervous system complicating pregnancy, third trimester: Secondary | ICD-10-CM

## 2020-12-01 DIAGNOSIS — Z362 Encounter for other antenatal screening follow-up: Secondary | ICD-10-CM

## 2020-12-01 DIAGNOSIS — E669 Obesity, unspecified: Secondary | ICD-10-CM | POA: Diagnosis not present

## 2020-12-01 DIAGNOSIS — O99213 Obesity complicating pregnancy, third trimester: Secondary | ICD-10-CM

## 2020-12-01 DIAGNOSIS — O4443 Low lying placenta NOS or without hemorrhage, third trimester: Secondary | ICD-10-CM | POA: Insufficient documentation

## 2020-12-01 DIAGNOSIS — O99212 Obesity complicating pregnancy, second trimester: Secondary | ICD-10-CM

## 2020-12-01 DIAGNOSIS — G43909 Migraine, unspecified, not intractable, without status migrainosus: Secondary | ICD-10-CM

## 2020-12-01 DIAGNOSIS — Z3A29 29 weeks gestation of pregnancy: Secondary | ICD-10-CM | POA: Insufficient documentation

## 2020-12-01 DIAGNOSIS — O2693 Pregnancy related conditions, unspecified, third trimester: Secondary | ICD-10-CM | POA: Diagnosis not present

## 2020-12-01 DIAGNOSIS — O99343 Other mental disorders complicating pregnancy, third trimester: Secondary | ICD-10-CM | POA: Diagnosis not present

## 2020-12-01 DIAGNOSIS — O4440 Low lying placenta NOS or without hemorrhage, unspecified trimester: Secondary | ICD-10-CM

## 2020-12-01 DIAGNOSIS — F909 Attention-deficit hyperactivity disorder, unspecified type: Secondary | ICD-10-CM

## 2020-12-03 ENCOUNTER — Inpatient Hospital Stay (HOSPITAL_COMMUNITY)
Admission: AD | Admit: 2020-12-03 | Discharge: 2020-12-03 | Disposition: A | Discharge status: Home / Self Care | Payer: No Typology Code available for payment source | Attending: Obstetrics and Gynecology | Admitting: Obstetrics and Gynecology

## 2020-12-03 ENCOUNTER — Other Ambulatory Visit: Payer: Self-pay

## 2020-12-03 ENCOUNTER — Encounter (HOSPITAL_COMMUNITY): Payer: Self-pay | Admitting: Obstetrics and Gynecology

## 2020-12-03 DIAGNOSIS — Z79899 Other long term (current) drug therapy: Secondary | ICD-10-CM | POA: Diagnosis not present

## 2020-12-03 DIAGNOSIS — M549 Dorsalgia, unspecified: Secondary | ICD-10-CM | POA: Diagnosis not present

## 2020-12-03 DIAGNOSIS — Z3A29 29 weeks gestation of pregnancy: Secondary | ICD-10-CM | POA: Insufficient documentation

## 2020-12-03 DIAGNOSIS — Z87891 Personal history of nicotine dependence: Secondary | ICD-10-CM | POA: Insufficient documentation

## 2020-12-03 DIAGNOSIS — Z0371 Encounter for suspected problem with amniotic cavity and membrane ruled out: Secondary | ICD-10-CM | POA: Diagnosis not present

## 2020-12-03 DIAGNOSIS — O26893 Other specified pregnancy related conditions, third trimester: Secondary | ICD-10-CM | POA: Insufficient documentation

## 2020-12-03 LAB — URINALYSIS, ROUTINE W REFLEX MICROSCOPIC
Bilirubin Urine: NEGATIVE
Glucose, UA: NEGATIVE mg/dL
Ketones, ur: NEGATIVE mg/dL
Nitrite: NEGATIVE
Protein, ur: NEGATIVE mg/dL
Specific Gravity, Urine: 1.019 (ref 1.005–1.030)
pH: 6 (ref 5.0–8.0)

## 2020-12-03 LAB — WET PREP, GENITAL
Clue Cells Wet Prep HPF POC: NONE SEEN
Sperm: NONE SEEN
Trich, Wet Prep: NONE SEEN
Yeast Wet Prep HPF POC: NONE SEEN

## 2020-12-03 LAB — POCT FERN TEST: POCT Fern Test: NEGATIVE

## 2020-12-03 NOTE — MAU Provider Note (Signed)
History     CSN: [id number]  Arrival date and time: 12/03/20 1617   Event Date/Time   First Provider Initiated Contact with Patient 12/03/20 1810      Chief Complaint  Patient presents with   Rupture of Membranes   Vaginal Discharge   Back Pain   [name] is a 30 y.o. year old G7P0 female at 38w2dweeks gestation who presents to MAU reporting she has been wearing a pad all week, felt wetness thinking it was sweat and/or urine. She reports the wetness didn't smell like urine. She also reports a "sticky" vaginal discharge all week. She started having cramping at the end of her shift "like a period" on 12/01/2020. [facility] She reports they told her "I have a low fluid level." Her spouse is present and contributing to the history taking.    OB History     Gravida  1   Para      Term      Preterm      AB      Living         SAB      IAB      Ectopic      Multiple      Live Births              Past Medical History:  Diagnosis Date   Alopecia    Recurrent upper respiratory infection (URI)    Seasonal allergies     Past Surgical History:  Procedure Laterality Date   NO PAST SURGERIES      Family History  Problem Relation Age of Onset   Diabetes Father 474  Hypertension Father 574  Depression Brother 168      bipolar   Cancer Maternal Aunt 464      colon   Heart attack Paternal Grandmother 676  Kidney Stones Mother     Social History   Tobacco Use   Smoking status: Former    Packs/day: 0.25    Years: 1.00    Pack years: 0.25    Types: Cigarettes    Quit date: 06/12/2010    Years since quitting: 10.4   Smokeless tobacco: Never  Vaping Use   Vaping Use: Never used  Substance Use Topics   Alcohol use: Yes    Alcohol/week: 1.0 standard drink    Types: 1 Glasses of wine per week   Drug use: No    Allergies:  Allergies  Allergen Reactions   Rizatriptan Swelling and Other (See Comments)    Mouth  swelling    Medications Prior to Admission  Medication Sig Dispense Refill Last Dose   aspirin 81 MG chewable tablet Chew by mouth daily.   12/03/2020   docusate sodium (COLACE) 100 MG capsule Take 100 mg by mouth daily.   Past Month   fluticasone (FLONASE) 50 MCG/ACT nasal spray Place 1 spray into both nostrils 2 (two) times daily. 16 g 6 Past Week   loratadine (CLARITIN) 10 MG tablet Take 10 mg by mouth daily.   12/03/2020   ondansetron (ZOFRAN-ODT) 8 MG disintegrating tablet DISSOLVE 1 TABLET BY MOUTH EVERY 8 HOURS AS NEEDED 30 tablet 1 Past Month   Prenatal Vit-Fe Fumarate-FA (MULTIVITAMIN-PRENATAL) 27-0.8 MG TABS tablet Take 1 tablet by mouth daily at 12 noon.   12/03/2020   albuterol (VENTOLIN HFA) 108 (90 Base) MCG/ACT inhaler Inhale 2 puffs into the lungs every four  hours as needed for wheezing or shortness of breath. 8.5 g 2 More than a month   COVID-19 At Home Antigen Test (CARESTART COVID-19 HOME TEST) KIT Use as directed 4 each 0    guaiFENesin-dextromethorphan (ROBITUSSIN DM) 100-10 MG/5ML syrup Take 5 mLs by mouth every 4 (four) hours as needed for cough.   More than a month   ondansetron (ZOFRAN-ODT) 8 MG disintegrating tablet Take 8 mg by mouth every 8 (eight) hours as needed for nausea or vomiting. (Patient not taking: Reported on 09/21/2020)      ondansetron (ZOFRAN-ODT) 8 MG disintegrating tablet DISSOLVE 1 TABLET BY MOUTH EVERY 8 HOURS 30 tablet 0    ondansetron (ZOFRAN-ODT) 8 MG disintegrating tablet DISSOLVE 1 TABLET UNDER THE TONGUE EVERY 8 HOURS AS NEEDED 30 tablet 1    phenylephrine (SUDAFED PE) 10 MG TABS tablet Take 10 mg by mouth every 4 (four) hours as needed. (Patient not taking: Reported on 09/21/2020)      promethazine (PHENERGAN) 25 MG tablet TAKE 1 TABLET BY MOUTH EVERY 4 HOURS (Patient not taking: Reported on 09/21/2020) 60 tablet 4     Review of Systems  Constitutional: Negative.   HENT: Negative.    Eyes: Negative.   Respiratory: Negative.    Cardiovascular:  Negative.   Gastrointestinal: Negative.   Endocrine: Negative.   Genitourinary:  Positive for pelvic pain (cramping) and vaginal discharge.  Musculoskeletal: Negative.   Allergic/Immunologic: Negative.   Neurological: Negative.   Hematological: Negative.   Psychiatric/Behavioral: Negative.    Physical Exam   Blood pressure (!) 103/45, pulse 95, temperature 98.1 F (36.7 C), temperature source Oral, resp. rate 20, height _0  (1.6 m), weight 92.5 kg, last menstrual period 05/05/2020, SpO2 99 %.  Physical Exam Vitals and nursing note reviewed. Exam conducted with a chaperone present.  Constitutional:      Appearance: Normal appearance. She is obese.  Cardiovascular:     Rate and Rhythm: Normal rate.  Pulmonary:     Effort: Pulmonary effort is normal.  Abdominal:     Palpations: Abdomen is soft.  Genitourinary:    General: Normal vulva.     Comments: Pelvic exam: External genitalia normal, SE: vaginal walls pink and well rugated, cervix is smooth, pink, no lesions, scant amt of mucousy, white vaginal d/c -- WP, GC/CT done, cervix visually closed Skin:    General: Skin is warm and dry.  Neurological:     Mental Status: She is alert and oriented to person, place, and time.  Psychiatric:        Mood and Affect: Mood normal.        Behavior: Behavior normal.        Thought Content: Thought content normal.        Judgment: Judgment normal.   REACTIVE NST - FHR: 135 bpm / moderate variability / accels present / decels absent / TOCO: none MAU Course  Procedures  MDM [facility] Wet Prep GC/CT -- Results pending  Maryann Alar Slide  Results for orders placed or performed during the hospital encounter of 12/03/20 (from the past 24 hour(s))  Urinalysis, Routine w reflex microscopic Urine, Clean Catch     Status: Abnormal   Collection Time: 12/03/20  5:13 PM  Result Value Ref Range   Color, Urine YELLOW YELLOW   APPearance HAZY (A) CLEAR   Specific Gravity, Urine 1.019 1.005 - 1.030   pH  6.0 5.0 - 8.0   Glucose, UA NEGATIVE NEGATIVE mg/dL   Hgb urine dipstick SMALL (A)  NEGATIVE   Bilirubin Urine NEGATIVE NEGATIVE   Ketones, ur NEGATIVE NEGATIVE mg/dL   Protein, ur NEGATIVE NEGATIVE mg/dL   Nitrite NEGATIVE NEGATIVE   Leukocytes,Ua TRACE (A) NEGATIVE   RBC / HPF 6-10 0 - 5 RBC/hpf   WBC, UA 6-10 0 - 5 WBC/hpf   Bacteria, UA FEW (A) NONE SEEN   Squamous Epithelial / LPF 11-20 0 - 5   Mucus PRESENT   Wet prep, genital     Status: Abnormal   Collection Time: 12/03/20  6:25 PM  Result Value Ref Range   Yeast Wet Prep HPF POC NONE SEEN NONE SEEN   Trich, Wet Prep NONE SEEN NONE SEEN   Clue Cells Wet Prep HPF POC NONE SEEN NONE SEEN   WBC, Wet Prep HPF POC FEW (A) NONE SEEN   Sperm NONE SEEN   Fern Test     Status: Normal   Collection Time: 12/03/20  7:02 PM  Result Value Ref Range   POCT Fern Test Negative = intact amniotic membranes     Assessment and Plan  No leakage of amniotic fluid into vagina - Advised that fern was negative and no visualization   [redacted] weeks gestation of pregnancy   - Discharge patient - Keep scheduled appt with [facility] - Patient verbalized an understanding of the plan of care and agrees.    Laury Deep, CNM 12/03/2020, 6:10 PM

## 2020-12-03 NOTE — MAU Note (Signed)
Has  been having to wear a pad this week, has felt wetness- thought it might be urine- but didn't really smell like that.  has seen a sticky d/c.  Wed, started having some cramps at the end of her shift, kind of like period cramps.

## 2020-12-03 NOTE — Discharge Instructions (Signed)
Your water is not broken

## 2020-12-06 LAB — GC/CHLAMYDIA PROBE AMP (~~LOC~~) NOT AT ARMC
Chlamydia: NEGATIVE
Comment: NEGATIVE
Comment: NORMAL
Neisseria Gonorrhea: NEGATIVE

## 2020-12-12 ENCOUNTER — Inpatient Hospital Stay (HOSPITAL_COMMUNITY)
Admission: AD | Admit: 2020-12-12 | Discharge: 2020-12-13 | Disposition: A | Discharge status: Home / Self Care | Payer: No Typology Code available for payment source | Attending: Obstetrics & Gynecology | Admitting: Obstetrics & Gynecology

## 2020-12-12 ENCOUNTER — Encounter (HOSPITAL_COMMUNITY): Payer: Self-pay | Admitting: Obstetrics & Gynecology

## 2020-12-12 DIAGNOSIS — Z3493 Encounter for supervision of normal pregnancy, unspecified, third trimester: Secondary | ICD-10-CM

## 2020-12-12 DIAGNOSIS — Z3689 Encounter for other specified antenatal screening: Secondary | ICD-10-CM | POA: Insufficient documentation

## 2020-12-12 DIAGNOSIS — O368130 Decreased fetal movements, third trimester, not applicable or unspecified: Secondary | ICD-10-CM

## 2020-12-12 DIAGNOSIS — Z3A30 30 weeks gestation of pregnancy: Secondary | ICD-10-CM | POA: Insufficient documentation

## 2020-12-12 NOTE — MAU Note (Signed)
Pt reports she has not felt baby movement most of the day.  Stated she has been out most of the day but when she got home and rested still did not feel baby. Denies any pain or vag bleeding or leaking.

## 2020-12-12 NOTE — MAU Provider Note (Signed)
History     CSN: [id number]  Arrival date and time: 12/12/20 2304   Event Date/Time   First Provider Initiated Contact with Patient 12/12/20 2345      Chief Complaint  Patient presents with   Decreased Fetal Movement   HPI Stefanie White is a 30 y.o. G1P0 at [redacted]w[redacted]d who presents with decreased fetal movement. She states she was out and active throughout much of the day today and when she laid down to go to bed, she realized she hadn't felt the baby move. She is unsure the last time she felt the baby move normally. She denies any pain, vaginal bleeding or discharge.  OB History     Gravida  1   Para      Term      Preterm      AB      Living         SAB      IAB      Ectopic      Multiple      Live Births              Past Medical History:  Diagnosis Date   Alopecia    Recurrent upper respiratory infection (URI)    Seasonal allergies     Past Surgical History:  Procedure Laterality Date   NO PAST SURGERIES      Family History  Problem Relation Age of Onset   Diabetes Father 50   Hypertension Father 30   Depression Brother 10       bipolar   Cancer Maternal Aunt 45       colon   Heart attack Paternal Grandmother 76   Kidney Stones Mother     Social History   Tobacco Use   Smoking status: Former    Packs/day: 0.25    Years: 1.00    Pack years: 0.25    Types: Cigarettes    Quit date: 06/12/2010    Years since quitting: 10.5   Smokeless tobacco: Never  Vaping Use   Vaping Use: Never used  Substance Use Topics   Alcohol use: Yes    Alcohol/week: 1.0 standard drink    Types: 1 Glasses of wine per week   Drug use: No    Allergies:  Allergies  Allergen Reactions   Rizatriptan Swelling and Other (See Comments)    Mouth swelling    No medications prior to admission.    Review of Systems  Constitutional: Negative.  Negative for fatigue and fever.  HENT: Negative.    Respiratory: Negative.  Negative for shortness of breath.    Cardiovascular: Negative.  Negative for chest pain.  Gastrointestinal: Negative.  Negative for abdominal pain, constipation, diarrhea, nausea and vomiting.  Genitourinary: Negative.  Negative for dysuria.  Neurological: Negative.  Negative for dizziness and headaches.  Physical Exam   Blood pressure 134/71, pulse 96, temperature 97.8 F (36.6 C), resp. rate 18, height 5\' 3"  (1.6 m), weight 91.6 kg, last menstrual period 05/05/2020.  Physical Exam Vitals and nursing note reviewed.  Constitutional:      General: She is not in acute distress.    Appearance: She is well-developed.  HENT:     Head: Normocephalic.  Eyes:     Pupils: Pupils are equal, round, and reactive to light.  Cardiovascular:     Rate and Rhythm: Normal rate and regular rhythm.     Heart sounds: Normal heart sounds.  Pulmonary:     Effort: Pulmonary  effort is normal. No respiratory distress.     Breath sounds: Normal breath sounds.  Abdominal:     General: Bowel sounds are normal. There is no distension.     Palpations: Abdomen is soft.     Tenderness: There is no abdominal tenderness.  Skin:    General: Skin is warm and dry.  Neurological:     Mental Status: She is alert and oriented to person, place, and time.  Psychiatric:        Mood and Affect: Mood normal.        Behavior: Behavior normal.        Thought Content: Thought content normal.        Judgment: Judgment normal.   Fetal Tracing:  Baseline: 120 Variability: moderate Accels: 15x15 Decels: none  Toco: none   MAU Course  Procedures  MDM Prenatal records from private office reviewed. Pregnancy complicated by low fluid per patient report.  NST reactive Patient reports feeling normal fetal movement and recording many fetal movements with clicker  Reassurance provided and fetal kick counts reviewed at length.   Assessment and Plan   1. NST (non-stress test) reactive on fetal surveillance   2. Fetal movement present during pregnancy  in third trimester   3. [redacted] weeks gestation of pregnancy    -Discharge home in stable condition -Third trimester precautions discussed -Patient advised to follow-up with OB as scheduled this week for prenatal care -Patient may return to MAU as needed or if her condition were to change or worsen   Rolm Bookbinder CNM 12/13/2020, 1:19 AM

## 2020-12-13 DIAGNOSIS — Z3689 Encounter for other specified antenatal screening: Secondary | ICD-10-CM | POA: Diagnosis not present

## 2020-12-13 DIAGNOSIS — O368130 Decreased fetal movements, third trimester, not applicable or unspecified: Secondary | ICD-10-CM | POA: Diagnosis present

## 2020-12-13 DIAGNOSIS — Z3A30 30 weeks gestation of pregnancy: Secondary | ICD-10-CM | POA: Diagnosis not present

## 2020-12-13 NOTE — Discharge Instructions (Signed)

## 2020-12-15 ENCOUNTER — Encounter: Payer: Self-pay | Admitting: *Deleted

## 2020-12-15 ENCOUNTER — Other Ambulatory Visit: Payer: Self-pay

## 2020-12-15 ENCOUNTER — Ambulatory Visit: Payer: No Typology Code available for payment source | Admitting: *Deleted

## 2020-12-15 ENCOUNTER — Ambulatory Visit: Payer: No Typology Code available for payment source | Attending: Maternal & Fetal Medicine

## 2020-12-15 VITALS — BP 125/73 | HR 89

## 2020-12-15 DIAGNOSIS — F909 Attention-deficit hyperactivity disorder, unspecified type: Secondary | ICD-10-CM

## 2020-12-15 DIAGNOSIS — Z3A31 31 weeks gestation of pregnancy: Secondary | ICD-10-CM

## 2020-12-15 DIAGNOSIS — Z6834 Body mass index (BMI) 34.0-34.9, adult: Secondary | ICD-10-CM | POA: Diagnosis present

## 2020-12-15 DIAGNOSIS — O99343 Other mental disorders complicating pregnancy, third trimester: Secondary | ICD-10-CM | POA: Diagnosis not present

## 2020-12-15 DIAGNOSIS — G43909 Migraine, unspecified, not intractable, without status migrainosus: Secondary | ICD-10-CM

## 2020-12-15 DIAGNOSIS — O99213 Obesity complicating pregnancy, third trimester: Secondary | ICD-10-CM | POA: Diagnosis present

## 2020-12-15 DIAGNOSIS — O4440 Low lying placenta NOS or without hemorrhage, unspecified trimester: Secondary | ICD-10-CM | POA: Insufficient documentation

## 2020-12-15 DIAGNOSIS — O99353 Diseases of the nervous system complicating pregnancy, third trimester: Secondary | ICD-10-CM | POA: Diagnosis not present

## 2020-12-15 DIAGNOSIS — E669 Obesity, unspecified: Secondary | ICD-10-CM

## 2020-12-15 DIAGNOSIS — Z362 Encounter for other antenatal screening follow-up: Secondary | ICD-10-CM

## 2020-12-27 ENCOUNTER — Ambulatory Visit: Payer: No Typology Code available for payment source

## 2020-12-27 ENCOUNTER — Ambulatory Visit: Payer: Self-pay

## 2021-01-21 ENCOUNTER — Other Ambulatory Visit (HOSPITAL_COMMUNITY): Payer: Self-pay

## 2021-01-21 MED ORDER — CARESTART COVID-19 HOME TEST VI KIT
PACK | 0 refills | Status: DC
  Filled 2021-01-21: qty 4, 4d supply, fill #0

## 2021-01-26 ENCOUNTER — Other Ambulatory Visit (HOSPITAL_COMMUNITY): Payer: Self-pay

## 2021-01-26 MED ORDER — TRIAMCINOLONE ACETONIDE 0.1 % EX OINT
TOPICAL_OINTMENT | CUTANEOUS | 0 refills | Status: DC
  Filled 2021-01-26: qty 15, 7d supply, fill #0

## 2021-01-28 ENCOUNTER — Encounter (HOSPITAL_COMMUNITY): Payer: Self-pay | Admitting: Obstetrics and Gynecology

## 2021-01-28 ENCOUNTER — Other Ambulatory Visit: Payer: Self-pay

## 2021-01-28 ENCOUNTER — Inpatient Hospital Stay (HOSPITAL_COMMUNITY)
Admission: AD | Admit: 2021-01-28 | Discharge: 2021-01-28 | Disposition: A | Discharge status: Home / Self Care | Payer: No Typology Code available for payment source | Attending: Obstetrics and Gynecology | Admitting: Obstetrics and Gynecology

## 2021-01-28 DIAGNOSIS — Z79899 Other long term (current) drug therapy: Secondary | ICD-10-CM | POA: Diagnosis not present

## 2021-01-28 DIAGNOSIS — N898 Other specified noninflammatory disorders of vagina: Secondary | ICD-10-CM | POA: Diagnosis present

## 2021-01-28 DIAGNOSIS — Z3A37 37 weeks gestation of pregnancy: Secondary | ICD-10-CM | POA: Insufficient documentation

## 2021-01-28 DIAGNOSIS — O26893 Other specified pregnancy related conditions, third trimester: Secondary | ICD-10-CM | POA: Diagnosis not present

## 2021-01-28 LAB — WET PREP, GENITAL
Clue Cells Wet Prep HPF POC: NONE SEEN
Sperm: NONE SEEN
Trich, Wet Prep: NONE SEEN
Yeast Wet Prep HPF POC: NONE SEEN

## 2021-01-28 LAB — POCT FERN TEST: POCT Fern Test: NEGATIVE

## 2021-01-28 NOTE — MAU Provider Note (Signed)
Chief Complaint:  Rupture of Membranes and Contractions   Event Date/Time   First Provider Initiated Contact with Patient 01/28/21 1523     HPI: Stefanie White is a 30 y.o. G1P0 at 88w2dwho presents to maternity admissions reporting possible leaking fluid. Patient reports she was in the shower around 10am and noticed a gush of fluid. She is unsure if her water broke, but thinks she may have just peed on herself. She has not had any continued leaking and is not wearing a pad. Reports some braxton hicks contractions, mostly when she is active around the house. Thinks she overdid it at work yesterday. Denies vaginal bleeding, headache, vision changes, RUQ pain. Endorses active fetal movement.  Pregnancy Course:   Past Medical History:  Diagnosis Date   Alopecia    Recurrent upper respiratory infection (URI)    Seasonal allergies    OB History  Gravida Para Term Preterm AB Living  1            SAB IAB Ectopic Multiple Live Births               # Outcome Date GA Lbr Len/2nd Weight Sex Delivery Anes PTL Lv  1 Current            Past Surgical History:  Procedure Laterality Date   NO PAST SURGERIES     Family History  Problem Relation Age of Onset   Kidney Stones Mother    Diabetes Father 443  Hypertension Father 577  Depression Brother 170      bipolar   Cancer Maternal Aunt 474      colon   Heart attack Paternal Grandmother 69   Social History   Tobacco Use   Smoking status: Former    Packs/day: 0.25    Years: 1.00    Pack years: 0.25    Types: Cigarettes    Quit date: 06/12/2010    Years since quitting: 10.6   Smokeless tobacco: Never  Vaping Use   Vaping Use: Never used  Substance Use Topics   Alcohol use: Not Currently    Alcohol/week: 1.0 standard drink    Types: 1 Glasses of wine per week    Comment: Socially   Drug use: No   Allergies  Allergen Reactions   Rizatriptan Swelling and Other (See Comments)    Mouth swelling   Medications Prior to Admission   Medication Sig Dispense Refill Last Dose   aspirin 81 MG chewable tablet Chew by mouth daily.   01/28/2021   cetirizine (ZYRTEC) 10 MG tablet Take 10 mg by mouth daily.   01/28/2021   docusate sodium (COLACE) 100 MG capsule Take 100 mg by mouth daily.   Past Week   ondansetron (ZOFRAN-ODT) 8 MG disintegrating tablet DISSOLVE 1 TABLET UNDER THE TONGUE EVERY 8 HOURS AS NEEDED 30 tablet 1 01/27/2021   Prenatal Vit-Fe Fumarate-FA (MULTIVITAMIN-PRENATAL) 27-0.8 MG TABS tablet Take 1 tablet by mouth daily at 12 noon.   01/28/2021   triamcinolone ointment (KENALOG) 0.1 % APPLY A THIN LAYER TO THE AFFECTED AREA(S) OF SKIN 2 TIMES PER DAY FOR 7 DAYS 15 g 0 01/28/2021   albuterol (VENTOLIN HFA) 108 (90 Base) MCG/ACT inhaler Inhale 2 puffs into the lungs every four hours as needed for wheezing or shortness of breath. 8.5 g 2 More than a month   COVID-19 At Home Antigen Test (CARESTART COVID-19 HOME TEST) KIT Use as directed 4 each 0    fluticasone (  FLONASE) 50 MCG/ACT nasal spray Place 1 spray into both nostrils 2 (two) times daily. 16 g 6 01/25/2021   guaiFENesin-dextromethorphan (ROBITUSSIN DM) 100-10 MG/5ML syrup Take 5 mLs by mouth every 4 (four) hours as needed for cough. (Patient not taking: Reported on 12/15/2020)      loratadine (CLARITIN) 10 MG tablet Take 10 mg by mouth daily.      phenylephrine (SUDAFED PE) 10 MG TABS tablet Take 10 mg by mouth every 4 (four) hours as needed. (Patient not taking: Reported on 12/15/2020)      promethazine (PHENERGAN) 25 MG tablet TAKE 1 TABLET BY MOUTH EVERY 4 HOURS 60 tablet 4     I have reviewed patient's Past Medical Hx, Surgical Hx, Family Hx, Social Hx, medications and allergies.   ROS:  Review of Systems  Constitutional: Negative.   Respiratory: Negative.    Cardiovascular: Negative.   Gastrointestinal: Negative.   Genitourinary:  Positive for vaginal discharge. Negative for dysuria, frequency and vaginal bleeding.  Musculoskeletal: Negative.   Neurological:  Negative.    Physical Exam  Patient Vitals for the past 24 hrs:  BP Temp Temp src Pulse Resp SpO2 Height Weight  01/28/21 1500 135/65 -- -- (!) 111 -- 98 % -- --  01/28/21 1455 133/83 -- -- (!) 116 20 99 % -- --  01/28/21 1439 123/72 97.9 F (36.6 C) Oral (!) 107 18 99 % '5\' 3"'  (1.6 m) 95.9 kg   Constitutional: well-developed, well-nourished female in no acute distress.  Cardiovascular: normal rate Respiratory: normal effort GI: abd soft, non-tender, gravid  MS: extremities nontender, no edema, normal ROM Neurologic: alert and oriented x 4.  GU: neg CVAT. Pelvic: NEFG, physiologic discharge, no pooling of amniotic fluid, no blood, cervix clean without lesions/masses, cervix visually closed Cervix: declines cervical exam  FHT: Baseline 130 bpm, moderate variability, 15x15 accelerations present, no decelerations Toco: quiet    Labs: Results for orders placed or performed during the hospital encounter of 01/28/21 (from the past 24 hour(s))  Wet prep, genital     Status: Abnormal   Collection Time: 01/28/21  3:34 PM   Specimen: Vaginal  Result Value Ref Range   Yeast Wet Prep HPF POC NONE SEEN NONE SEEN   Trich, Wet Prep NONE SEEN NONE SEEN   Clue Cells Wet Prep HPF POC NONE SEEN NONE SEEN   WBC, Wet Prep HPF POC MODERATE (A) NONE SEEN   Sperm NONE SEEN     Imaging:  No results found.  MAU Course: Orders Placed This Encounter  Procedures   Wet prep, genital   Fern Test   Discharge patient   No orders of the defined types were placed in this encounter.    MDM: Speculum exam without pooling of amniotic fluid Fern negative Wet prep negative NST reactive, toco quiet   Assessment: 1. [redacted] weeks gestation of pregnancy   2. Vaginal discharge during pregnancy, third trimester     Plan: Discharge home in stable condition  Labor precautions and fetal kick counts Patient to keep appointment as scheduled on Wednesday    Follow-up Information     Ob/Gyn, [facility] Follow up.   Why: as scheduled. Return to MAU as needed. Contact information: [place] [address] [phone]                 Allergies as of 01/28/2021       Reactions   Rizatriptan Swelling, Other (See Comments)   Mouth  swelling        Medication List     TAKE these medications    albuterol 108 (90 Base) MCG/ACT inhaler Commonly known as: VENTOLIN HFA Inhale 2 puffs into the lungs every four hours as needed for wheezing or shortness of breath.   aspirin 81 MG chewable tablet Chew by mouth daily.   Carestart COVID-19 Home Test Kit Generic drug: COVID-19 At Home Antigen Test Use as directed   cetirizine 10 MG tablet Commonly known as: ZYRTEC Take 10 mg by mouth daily.   docusate sodium 100 MG capsule Commonly known as: COLACE Take 100 mg by mouth daily.   fluticasone 50 MCG/ACT nasal spray Commonly known as: FLONASE Place 1 spray into both nostrils 2 (two) times daily.   guaiFENesin-dextromethorphan 100-10 MG/5ML syrup Commonly known as: ROBITUSSIN DM Take 5 mLs by mouth every 4 (four) hours as needed for cough.   loratadine 10 MG tablet Commonly known as: CLARITIN Take 10 mg by mouth daily.   multivitamin-prenatal 27-0.8 MG Tabs tablet Take 1 tablet by mouth daily at 12 noon.   ondansetron 8 MG disintegrating tablet Commonly known as: ZOFRAN-ODT DISSOLVE 1 TABLET UNDER THE TONGUE EVERY 8 HOURS AS NEEDED   phenylephrine 10 MG Tabs tablet Commonly known as: SUDAFED PE Take 10 mg by mouth every 4 (four) hours as needed.   promethazine 25 MG tablet Commonly known as: PHENERGAN TAKE 1 TABLET BY MOUTH EVERY 4 HOURS   triamcinolone ointment 0.1 % Commonly known as: KENALOG APPLY A THIN LAYER TO THE AFFECTED AREA(S) OF SKIN 2 TIMES PER DAY FOR 7 DAYS         Maryagnes Amos, MSN, CNM 01/28/2021 4:02 PM

## 2021-01-28 NOTE — MAU Note (Signed)
Pretty sure it is just [facility], but when she was in the shower at 1000, had a sharp pain, pretty sure she peed herself.  Sharp pains have continued.

## 2021-02-09 ENCOUNTER — Other Ambulatory Visit: Payer: Self-pay

## 2021-02-09 ENCOUNTER — Ambulatory Visit (INDEPENDENT_AMBULATORY_CARE_PROVIDER_SITE_OTHER): Payer: Self-pay | Admitting: Pediatrics

## 2021-02-09 DIAGNOSIS — Z7681 Expectant parent(s) prebirth pediatrician visit: Secondary | ICD-10-CM

## 2021-02-09 NOTE — Progress Notes (Signed)
Prenatal counseling for impending newborn done--  Reviewed current vaccine policy and answered all questions.  1st child, Currently 39 weeks, Current complications:  none, Prenatal care initiated:  early Z76.81  

## 2021-02-10 ENCOUNTER — Encounter: Payer: Self-pay | Admitting: Pediatrics

## 2021-02-14 ENCOUNTER — Other Ambulatory Visit: Payer: Self-pay | Admitting: Advanced Practice Midwife

## 2021-02-16 ENCOUNTER — Telehealth (HOSPITAL_COMMUNITY): Payer: Self-pay | Admitting: *Deleted

## 2021-02-17 ENCOUNTER — Encounter (HOSPITAL_COMMUNITY): Payer: Self-pay | Admitting: *Deleted

## 2021-02-17 ENCOUNTER — Encounter (HOSPITAL_COMMUNITY): Payer: Self-pay

## 2021-02-17 NOTE — Telephone Encounter (Signed)
Preadmission screen  

## 2021-02-23 ENCOUNTER — Other Ambulatory Visit (HOSPITAL_COMMUNITY): Payer: Self-pay

## 2021-02-24 ENCOUNTER — Inpatient Hospital Stay (HOSPITAL_COMMUNITY): Payer: No Typology Code available for payment source | Admitting: Anesthesiology

## 2021-02-24 ENCOUNTER — Encounter (HOSPITAL_COMMUNITY): Payer: Self-pay | Admitting: Obstetrics and Gynecology

## 2021-02-24 ENCOUNTER — Inpatient Hospital Stay (HOSPITAL_COMMUNITY)
Admission: AD | Admit: 2021-02-24 | Discharge: 2021-02-27 | DRG: 788 | Disposition: A | Discharge status: Home / Self Care | Payer: No Typology Code available for payment source | Attending: Obstetrics and Gynecology | Admitting: Obstetrics and Gynecology

## 2021-02-24 ENCOUNTER — Other Ambulatory Visit: Payer: Self-pay

## 2021-02-24 ENCOUNTER — Inpatient Hospital Stay (HOSPITAL_COMMUNITY): Payer: No Typology Code available for payment source

## 2021-02-24 DIAGNOSIS — Z87891 Personal history of nicotine dependence: Secondary | ICD-10-CM | POA: Diagnosis not present

## 2021-02-24 DIAGNOSIS — O99214 Obesity complicating childbirth: Secondary | ICD-10-CM | POA: Diagnosis present

## 2021-02-24 DIAGNOSIS — Z23 Encounter for immunization: Secondary | ICD-10-CM | POA: Diagnosis not present

## 2021-02-24 DIAGNOSIS — O26893 Other specified pregnancy related conditions, third trimester: Secondary | ICD-10-CM | POA: Diagnosis present

## 2021-02-24 DIAGNOSIS — O480 Post-term pregnancy: Principal | ICD-10-CM | POA: Diagnosis present

## 2021-02-24 DIAGNOSIS — Z3A41 41 weeks gestation of pregnancy: Secondary | ICD-10-CM | POA: Diagnosis not present

## 2021-02-24 DIAGNOSIS — R030 Elevated blood-pressure reading, without diagnosis of hypertension: Secondary | ICD-10-CM | POA: Diagnosis present

## 2021-02-24 DIAGNOSIS — O322XX0 Maternal care for transverse and oblique lie, not applicable or unspecified: Secondary | ICD-10-CM | POA: Diagnosis present

## 2021-02-24 DIAGNOSIS — Z8616 Personal history of COVID-19: Secondary | ICD-10-CM | POA: Diagnosis not present

## 2021-02-24 LAB — CBC
HCT: 36.4 % (ref 36.0–46.0)
Hemoglobin: 12.4 g/dL (ref 12.0–15.0)
MCH: 29.1 pg (ref 26.0–34.0)
MCHC: 34.1 g/dL (ref 30.0–36.0)
MCV: 85.4 fL (ref 80.0–100.0)
Platelets: 266 10*3/uL (ref 150–400)
RBC: 4.26 MIL/uL (ref 3.87–5.11)
RDW: 13.5 % (ref 11.5–15.5)
WBC: 14.2 10*3/uL — ABNORMAL HIGH (ref 4.0–10.5)
nRBC: 0 % (ref 0.0–0.2)

## 2021-02-24 LAB — TYPE AND SCREEN
ABO/RH(D): A POS
Antibody Screen: NEGATIVE

## 2021-02-24 LAB — RPR: RPR Ser Ql: NONREACTIVE

## 2021-02-24 MED ORDER — DIPHENHYDRAMINE HCL 50 MG/ML IJ SOLN
12.5000 mg | INTRAMUSCULAR | Status: DC | PRN
Start: 2021-02-24 — End: 2021-02-25

## 2021-02-24 MED ORDER — FENTANYL-BUPIVACAINE-NACL 0.5-0.125-0.9 MG/250ML-% EP SOLN
12.0000 mL/h | EPIDURAL | Status: DC | PRN
  Filled 2021-02-24: qty 250

## 2021-02-24 MED ORDER — OXYTOCIN-SODIUM CHLORIDE 30-0.9 UT/500ML-% IV SOLN
1.0000 m[IU]/min | INTRAVENOUS | Status: DC
  Administered 2021-02-24: 2 m[IU]/min via INTRAVENOUS

## 2021-02-24 MED ORDER — LIDOCAINE HCL (PF) 1 % IJ SOLN
INTRAMUSCULAR | Status: DC | PRN
  Administered 2021-02-24: 5 mL via EPIDURAL

## 2021-02-24 MED ORDER — PHENYLEPHRINE 40 MCG/ML (10ML) SYRINGE FOR IV PUSH (FOR BLOOD PRESSURE SUPPORT)
80.0000 ug | PREFILLED_SYRINGE | INTRAVENOUS | Status: DC | PRN
  Filled 2021-02-24: qty 10

## 2021-02-24 MED ORDER — OXYTOCIN-SODIUM CHLORIDE 30-0.9 UT/500ML-% IV SOLN
2.5000 [IU]/h | INTRAVENOUS | Status: DC
  Filled 2021-02-24: qty 500

## 2021-02-24 MED ORDER — FENTANYL CITRATE (PF) 100 MCG/2ML IJ SOLN
50.0000 ug | INTRAMUSCULAR | Status: DC | PRN
  Administered 2021-02-24: 50 ug via INTRAVENOUS
  Filled 2021-02-24: qty 2

## 2021-02-24 MED ORDER — OXYCODONE-ACETAMINOPHEN 5-325 MG PO TABS: 2.0000 | ORAL_TABLET | ORAL | Status: DC | PRN

## 2021-02-24 MED ORDER — TERBUTALINE SULFATE 1 MG/ML IJ SOLN: 0.2500 mg | Freq: Once | INTRAMUSCULAR | Status: DC | PRN

## 2021-02-24 MED ORDER — FENTANYL-BUPIVACAINE-NACL 0.5-0.125-0.9 MG/250ML-% EP SOLN
EPIDURAL | Status: DC | PRN
  Administered 2021-02-24: 12 mL/h via EPIDURAL

## 2021-02-24 MED ORDER — LIDOCAINE HCL (PF) 1 % IJ SOLN: 30.0000 mL | INTRAMUSCULAR | Status: DC | PRN

## 2021-02-24 MED ORDER — ACETAMINOPHEN 325 MG PO TABS: 650.0000 mg | ORAL_TABLET | ORAL | Status: DC | PRN

## 2021-02-24 MED ORDER — SOD CITRATE-CITRIC ACID 500-334 MG/5ML PO SOLN
30.0000 mL | ORAL | Status: DC | PRN
  Filled 2021-02-24: qty 30

## 2021-02-24 MED ORDER — EPHEDRINE 5 MG/ML INJ: 10.0000 mg | INTRAVENOUS | Status: DC | PRN

## 2021-02-24 MED ORDER — LACTATED RINGERS IV SOLN
500.0000 mL | Freq: Once | INTRAVENOUS | Status: AC
  Administered 2021-02-24: 500 mL via INTRAVENOUS

## 2021-02-24 MED ORDER — LACTATED RINGERS IV SOLN: INTRAVENOUS | Status: DC

## 2021-02-24 MED ORDER — ONDANSETRON HCL 4 MG/2ML IJ SOLN
4.0000 mg | Freq: Four times a day (QID) | INTRAMUSCULAR | Status: DC | PRN
  Administered 2021-02-24 – 2021-02-25 (×2): 4 mg via INTRAVENOUS
  Filled 2021-02-24 (×3): qty 2

## 2021-02-24 MED ORDER — EPHEDRINE 5 MG/ML INJ
10.0000 mg | INTRAVENOUS | Status: DC | PRN
Start: 2021-02-24 — End: 2021-02-25

## 2021-02-24 MED ORDER — MISOPROSTOL 25 MCG QUARTER TABLET
25.0000 ug | ORAL_TABLET | ORAL | Status: DC | PRN
  Administered 2021-02-24: 25 ug via VAGINAL
  Filled 2021-02-24: qty 1

## 2021-02-24 MED ORDER — OXYTOCIN BOLUS FROM INFUSION: 333.0000 mL | Freq: Once | INTRAVENOUS | Status: DC

## 2021-02-24 MED ORDER — PHENYLEPHRINE 40 MCG/ML (10ML) SYRINGE FOR IV PUSH (FOR BLOOD PRESSURE SUPPORT): 80.0000 ug | PREFILLED_SYRINGE | INTRAVENOUS | Status: DC | PRN

## 2021-02-24 MED ORDER — LACTATED RINGERS IV SOLN
500.0000 mL | INTRAVENOUS | Status: DC | PRN
  Administered 2021-02-24: 250 mL via INTRAVENOUS

## 2021-02-24 MED ORDER — OXYCODONE-ACETAMINOPHEN 5-325 MG PO TABS: 1.0000 | ORAL_TABLET | ORAL | Status: DC | PRN

## 2021-02-24 NOTE — Anesthesia Preprocedure Evaluation (Signed)
Anesthesia Evaluation  Patient identified by MRN, date of birth, ID band Patient awake    Reviewed: Allergy & Precautions, NPO status , Patient's Chart, lab work & pertinent test results  Airway Mallampati: II  TM Distance: >3 FB Neck ROM: Full    Dental no notable dental hx.    Pulmonary former smoker,    Pulmonary exam normal breath sounds clear to auscultation       Cardiovascular Exercise Tolerance: Good Normal cardiovascular exam Rhythm:Regular Rate:Normal     Neuro/Psych  Headaches,    GI/Hepatic negative GI ROS, Neg liver ROS,   Endo/Other  negative endocrine ROS  Renal/GU negative Renal ROS     Musculoskeletal   Abdominal (+) + obese (BMI 37.62),   Peds  Hematology Lab Results      Component                Value               Date                      WBC                      14.2 (H)            02/24/2021                HGB                      12.4                02/24/2021                HCT                      36.4                02/24/2021                MCV                      85.4                02/24/2021                PLT                      266                 02/24/2021              Anesthesia Other Findings   Reproductive/Obstetrics (+) Pregnancy                             Anesthesia Physical Anesthesia Plan  ASA: 3  Anesthesia Plan: Epidural   Post-op Pain Management:    Induction:   PONV Risk Score and Plan: Treatment may vary due to age or medical condition  Airway Management Planned:   Additional Equipment:   Intra-op Plan:   Post-operative Plan:   Informed Consent: I have reviewed the patients History and Physical, chart, labs and discussed the procedure including the risks, benefits and alternatives for the proposed anesthesia with the patient or authorized representative who has indicated his/her understanding and acceptance.     Dental  advisory given  Plan Discussed with:  Anesthesia Plan Comments: (41.1 WKPrimagravida for LEA)       Anesthesia Quick Evaluation

## 2021-02-24 NOTE — Progress Notes (Addendum)
OB Progress Note  S: Initially had pain relief with epidural, now complaining of 8/10 low back and pelvic pain   O: Today's Vitals   02/24/21 2141 02/24/21 2204 02/24/21 2214 02/24/21 2228  BP:  129/81    Pulse:  92    Resp:      Temp:      TempSrc:      SpO2:      Weight:      Height:      PainSc: 8   7  8     Body mass index is 37.62 kg/m.  SVE 5/80/-1, IUPC placed  FHR: 120bpm, moderate variability, + accels, intermittent small variable decels Toco: ctx q 1.5-4 mins   A/P: 30Y G1P0 @ [redacted]w[redacted]d, IOL late term Fetal well being: cat I-2 tracing, overall reassuring IOL: slow progress, but cervix more effaced and anterior than last exam. s/p cytotec x 1, s/p AROM. Continue pitocin. IUPC placed to aid in pitocin titration Pain control: epidural in place but pain not well controlled despite use PCEA multiple times. RN notified OB anesthesiologist who will come assess  4.  Elevated Bps: mild range noted with patient discomfort, normotensive when she is comfortable, will continue to monitor   M. [redacted]w[redacted]d, MD 02/24/21 10:34 PM

## 2021-02-24 NOTE — H&P (Signed)
Stefanie White is a 30 y.o. female G1P0 [redacted]w[redacted]d presenting for late term induction. She reports no LOF, VB Normal FM.   Irregular contractions since first cytotec placed at 0500 this morning.    Pregnancy c/b: Obesity: prepregnancy BMI 36, 32 week growth [place] 47%ile COVID 19 infection 01/16/21 Asthma, stable  OB History     Gravida  1   Para      Term      Preterm      AB      Living         SAB      IAB      Ectopic      Multiple      Live Births             Past Medical History:  Diagnosis Date   Alopecia    Recurrent upper respiratory infection (URI)    Seasonal allergies    Past Surgical History:  Procedure Laterality Date   NO PAST SURGERIES     Family History: family history includes Cancer (age of onset: 28) in her maternal aunt; Depression (age of onset: 7) in her brother; Diabetes (age of onset: 33) in her father; Heart attack (age of onset: 14) in her paternal grandmother; Hypertension (age of onset: 74) in her father; Kidney Stones in her mother. Social History:  reports that she quit smoking about 10 years ago. Her smoking use included cigarettes. She has a 0.25 pack-year smoking history. She has never used smokeless tobacco. She reports that she does not currently use alcohol after a past usage of about 1.0 standard drink per week. She reports that she does not use drugs.     Maternal Diabetes: No Genetic Screening: Normal Maternal Ultrasounds/Referrals: Normal Fetal Ultrasounds or other Referrals:  Other: 32 week growth [place] Maternal Substance Abuse:  No Significant Maternal Medications:  None Significant Maternal Lab Results:  Group B Strep negative Other Comments:  None  Review of Systems Per HPI Exam Physical Exam  Dilation: 4 Effacement (%): 50 Station: -2 Exam by:: Louvenia Golomb, MD Blood pressure 125/77, pulse 100, temperature 97.8 F (36.6 C), temperature source Oral, resp. rate 18, height 5\' 3"  (1.6 m), weight 96.3 kg, last menstrual  period 05/05/2020. Gen: NAD, resting comfortably CVS: RRR Lungs: nonlabored respirations Abd: Gravid abdomen, Leop[place] 7.5# Ext: no calf edema or tenderness  Fetal testing: 120bpm, moderate variability, + accels, no decels Toco: ctx irregular Prenatal labs: ABO, Rh:  --/--/A POS (09/15 0444) Antibody: NEG (09/15 0444) Rubella: Nonimmune (02/14 0000) RPR: Nonreactive (02/14 0000)  HBsAg: Negative (02/14 0000)  HIV: Non-reactive (02/14 0000)  GBS:   negative  Assessment/Plan: 30Y G1P0 @ [redacted]w[redacted]d, IOL late term Fetal well being: cat I tracing IOL: s/p cytotec x 1. Will start pitocin, plan for AROM when fetal station lower Pain control: epidural upon patient request  [redacted]w[redacted]d 02/24/2021, 9:20 AM

## 2021-02-24 NOTE — Progress Notes (Addendum)
OB Progress Note  S:Pt becoming more uncomfortable with contractions   O: Today's Vitals   02/24/21 1231 02/24/21 1234 02/24/21 1307 02/24/21 1318  BP: 118/76  (!) 149/91 132/75  Pulse: 79  (!) 105 92  Resp:      Temp: 98.1 F (36.7 C)     TempSrc: Axillary     Weight:      Height:      PainSc:  3      Body mass index is 37.62 kg/m.  SVE 4.5/50/-2  FHR:  130bpm, mod variability, + accels, no decels Toco: ctx q 2-3 min   A/P: 30Y G1P0 @ [redacted]w[redacted]d, IOL late term Fetal well being: cat I tracing IOL: s/p cytotec x 1. Continue pitocin. Offered AROM, patient declines at this time, wants to walk around first and will reconsider in a little bit.  Pain control: epidural upon patient request 4. Elevated BP during contraction: overall Bps within normal range, will monitor and check labs if persistently elevated  M. Timothy Lasso, MD 02/24/21 1:24 PM   Addendum:  Patient ready for AROM.   SVE 4.5/50/-1, AROM clear fluid  M. Timothy Lasso, MD 02/24/21 1:48 PM

## 2021-02-24 NOTE — Anesthesia Procedure Notes (Signed)

## 2021-02-25 ENCOUNTER — Encounter (HOSPITAL_COMMUNITY): Payer: Self-pay | Admitting: Obstetrics and Gynecology

## 2021-02-25 ENCOUNTER — Encounter (HOSPITAL_COMMUNITY)
Admission: AD | Disposition: A | Discharge status: Home / Self Care | Payer: Self-pay | Source: Home / Self Care | Attending: Obstetrics and Gynecology

## 2021-02-25 SURGERY — Surgical Case
Anesthesia: Epidural

## 2021-02-25 MED ORDER — DIPHENHYDRAMINE HCL 25 MG PO CAPS: 25.0000 mg | ORAL_CAPSULE | Freq: Four times a day (QID) | ORAL | Status: DC | PRN

## 2021-02-25 MED ORDER — KETOROLAC TROMETHAMINE 30 MG/ML IJ SOLN
INTRAMUSCULAR | Status: AC
  Filled 2021-02-25: qty 1

## 2021-02-25 MED ORDER — SUCCINYLCHOLINE CHLORIDE 200 MG/10ML IV SOSY
PREFILLED_SYRINGE | INTRAVENOUS | Status: AC
  Filled 2021-02-25: qty 10

## 2021-02-25 MED ORDER — NALBUPHINE HCL 10 MG/ML IJ SOLN: 5.0000 mg | INTRAMUSCULAR | Status: DC | PRN

## 2021-02-25 MED ORDER — PHENYLEPHRINE 40 MCG/ML (10ML) SYRINGE FOR IV PUSH (FOR BLOOD PRESSURE SUPPORT)
PREFILLED_SYRINGE | INTRAVENOUS | Status: AC
  Filled 2021-02-25: qty 10

## 2021-02-25 MED ORDER — ZOLPIDEM TARTRATE 5 MG PO TABS: 5.0000 mg | ORAL_TABLET | Freq: Every evening | ORAL | Status: DC | PRN

## 2021-02-25 MED ORDER — KETOROLAC TROMETHAMINE 30 MG/ML IJ SOLN: 30.0000 mg | Freq: Four times a day (QID) | INTRAMUSCULAR | Status: AC | PRN

## 2021-02-25 MED ORDER — FLUTICASONE PROPIONATE 50 MCG/ACT NA SUSP
1.0000 | Freq: Two times a day (BID) | NASAL | Status: DC
  Administered 2021-02-26 – 2021-02-27 (×2): 1 via NASAL
  Filled 2021-02-25: qty 16

## 2021-02-25 MED ORDER — DIPHENHYDRAMINE HCL 25 MG PO CAPS: 25.0000 mg | ORAL_CAPSULE | ORAL | Status: DC | PRN

## 2021-02-25 MED ORDER — COCONUT OIL OIL: 1.0000 "application " | TOPICAL_OIL | Status: DC | PRN

## 2021-02-25 MED ORDER — FLEET ENEMA 7-19 GM/118ML RE ENEM: 1.0000 | ENEMA | Freq: Every day | RECTAL | Status: DC | PRN

## 2021-02-25 MED ORDER — BUPIVACAINE HCL (PF) 0.25 % IJ SOLN
INTRAMUSCULAR | Status: DC | PRN
  Administered 2021-02-25: 1 mL via INTRATHECAL

## 2021-02-25 MED ORDER — LIDOCAINE-EPINEPHRINE (PF) 2 %-1:200000 IJ SOLN
INTRAMUSCULAR | Status: DC | PRN
  Administered 2021-02-25: 2 mL via EPIDURAL
  Administered 2021-02-25 (×2): 5 mL via EPIDURAL
  Administered 2021-02-25 (×2): 8 mL via EPIDURAL

## 2021-02-25 MED ORDER — SENNOSIDES-DOCUSATE SODIUM 8.6-50 MG PO TABS
2.0000 | ORAL_TABLET | ORAL | Status: DC
  Administered 2021-02-26: 2 via ORAL
  Filled 2021-02-25: qty 2

## 2021-02-25 MED ORDER — NALBUPHINE HCL 10 MG/ML IJ SOLN
5.0000 mg | Freq: Once | INTRAMUSCULAR | Status: DC | PRN
Start: 2021-02-25 — End: 2021-02-27

## 2021-02-25 MED ORDER — SUCCINYLCHOLINE CHLORIDE 200 MG/10ML IV SOSY
PREFILLED_SYRINGE | INTRAVENOUS | Status: DC | PRN
  Administered 2021-02-25: 100 mg via INTRAVENOUS

## 2021-02-25 MED ORDER — SODIUM CHLORIDE 0.9% FLUSH: 3.0000 mL | INTRAVENOUS | Status: DC | PRN

## 2021-02-25 MED ORDER — ONDANSETRON HCL 4 MG/2ML IJ SOLN
INTRAMUSCULAR | Status: AC
  Filled 2021-02-25: qty 4

## 2021-02-25 MED ORDER — ONDANSETRON HCL 4 MG/2ML IJ SOLN
INTRAMUSCULAR | Status: DC | PRN
  Administered 2021-02-25 (×2): 4 mg via INTRAVENOUS

## 2021-02-25 MED ORDER — IBUPROFEN 600 MG PO TABS
600.0000 mg | ORAL_TABLET | Freq: Four times a day (QID) | ORAL | Status: DC
  Administered 2021-02-26 – 2021-02-27 (×5): 600 mg via ORAL
  Filled 2021-02-25 (×5): qty 1

## 2021-02-25 MED ORDER — ALBUTEROL SULFATE (2.5 MG/3ML) 0.083% IN NEBU: 3.0000 mL | INHALATION_SOLUTION | RESPIRATORY_TRACT | Status: DC | PRN

## 2021-02-25 MED ORDER — MORPHINE SULFATE (PF) 0.5 MG/ML IJ SOLN
INTRAMUSCULAR | Status: DC | PRN
  Administered 2021-02-25: 2 mg via INTRAVENOUS

## 2021-02-25 MED ORDER — SCOPOLAMINE 1 MG/3DAYS TD PT72
MEDICATED_PATCH | TRANSDERMAL | Status: AC
  Filled 2021-02-25: qty 1

## 2021-02-25 MED ORDER — ACETAMINOPHEN 500 MG PO TABS: 1000.0000 mg | ORAL_TABLET | Freq: Four times a day (QID) | ORAL | Status: DC

## 2021-02-25 MED ORDER — LACTATED RINGERS IV SOLN: INTRAVENOUS | Status: DC | PRN

## 2021-02-25 MED ORDER — KETOROLAC TROMETHAMINE 30 MG/ML IJ SOLN
30.0000 mg | Freq: Four times a day (QID) | INTRAMUSCULAR | Status: AC
  Administered 2021-02-25 – 2021-02-26 (×3): 30 mg via INTRAVENOUS
  Filled 2021-02-25 (×3): qty 1

## 2021-02-25 MED ORDER — PROPOFOL 10 MG/ML IV BOLUS
INTRAVENOUS | Status: AC
  Filled 2021-02-25: qty 20

## 2021-02-25 MED ORDER — SOD CITRATE-CITRIC ACID 500-334 MG/5ML PO SOLN
30.0000 mL | ORAL | Status: AC
  Administered 2021-02-25: 30 mL via ORAL

## 2021-02-25 MED ORDER — MORPHINE SULFATE (PF) 0.5 MG/ML IJ SOLN
INTRAMUSCULAR | Status: AC
  Filled 2021-02-25: qty 10

## 2021-02-25 MED ORDER — WITCH HAZEL-GLYCERIN EX PADS: 1.0000 "application " | MEDICATED_PAD | CUTANEOUS | Status: DC | PRN

## 2021-02-25 MED ORDER — HYDROMORPHONE HCL 1 MG/ML IJ SOLN: 0.2000 mg | INTRAMUSCULAR | Status: DC | PRN

## 2021-02-25 MED ORDER — PRENATAL MULTIVITAMIN CH
1.0000 | ORAL_TABLET | Freq: Every day | ORAL | Status: DC
  Administered 2021-02-26 – 2021-02-27 (×2): 1 via ORAL
  Filled 2021-02-25 (×2): qty 1

## 2021-02-25 MED ORDER — ONDANSETRON HCL 4 MG/2ML IJ SOLN: 4.0000 mg | Freq: Three times a day (TID) | INTRAMUSCULAR | Status: DC | PRN

## 2021-02-25 MED ORDER — DEXAMETHASONE SODIUM PHOSPHATE 10 MG/ML IJ SOLN
INTRAMUSCULAR | Status: AC
  Filled 2021-02-25: qty 1

## 2021-02-25 MED ORDER — LORATADINE 10 MG PO TABS
10.0000 mg | ORAL_TABLET | Freq: Every day | ORAL | Status: DC
Start: 2021-02-25 — End: 2021-02-27
  Administered 2021-02-26 – 2021-02-27 (×2): 10 mg via ORAL
  Filled 2021-02-25 (×2): qty 1

## 2021-02-25 MED ORDER — FENTANYL CITRATE (PF) 100 MCG/2ML IJ SOLN
INTRAMUSCULAR | Status: AC
  Filled 2021-02-25: qty 2

## 2021-02-25 MED ORDER — NALOXONE HCL 4 MG/10ML IJ SOLN
1.0000 ug/kg/h | INTRAVENOUS | Status: DC | PRN
  Filled 2021-02-25: qty 5

## 2021-02-25 MED ORDER — CEFAZOLIN SODIUM-DEXTROSE 2-4 GM/100ML-% IV SOLN
INTRAVENOUS | Status: AC
  Filled 2021-02-25: qty 100

## 2021-02-25 MED ORDER — LACTATED RINGERS IV SOLN: INTRAVENOUS | Status: DC

## 2021-02-25 MED ORDER — DIPHENHYDRAMINE HCL 50 MG/ML IJ SOLN: 12.5000 mg | INTRAMUSCULAR | Status: DC | PRN

## 2021-02-25 MED ORDER — SIMETHICONE 80 MG PO CHEW
80.0000 mg | CHEWABLE_TABLET | ORAL | Status: DC | PRN
  Administered 2021-02-26 – 2021-02-27 (×2): 80 mg via ORAL
  Filled 2021-02-25 (×2): qty 1

## 2021-02-25 MED ORDER — BUPIVACAINE HCL (PF) 0.25 % IJ SOLN
INTRAMUSCULAR | Status: DC | PRN
  Administered 2021-02-24: 10 mL via EPIDURAL

## 2021-02-25 MED ORDER — SCOPOLAMINE 1 MG/3DAYS TD PT72
MEDICATED_PATCH | TRANSDERMAL | Status: DC | PRN
  Administered 2021-02-25: 1 via TRANSDERMAL

## 2021-02-25 MED ORDER — FENTANYL CITRATE (PF) 250 MCG/5ML IJ SOLN
INTRAMUSCULAR | Status: AC
  Filled 2021-02-25: qty 5

## 2021-02-25 MED ORDER — PROPOFOL 10 MG/ML IV BOLUS
INTRAVENOUS | Status: DC | PRN
  Administered 2021-02-25: 200 mg via INTRAVENOUS

## 2021-02-25 MED ORDER — MORPHINE SULFATE (PF) 0.5 MG/ML IJ SOLN
INTRAMUSCULAR | Status: DC | PRN
  Administered 2021-02-25: 3 mg via EPIDURAL

## 2021-02-25 MED ORDER — FENTANYL CITRATE (PF) 100 MCG/2ML IJ SOLN
INTRAMUSCULAR | Status: DC | PRN
  Administered 2021-02-25 (×3): 50 ug via INTRAVENOUS

## 2021-02-25 MED ORDER — DEXMEDETOMIDINE (PRECEDEX) IN NS 20 MCG/5ML (4 MCG/ML) IV SYRINGE
PREFILLED_SYRINGE | INTRAVENOUS | Status: DC | PRN
  Administered 2021-02-25 (×2): 8 ug via INTRAVENOUS

## 2021-02-25 MED ORDER — NALBUPHINE HCL 10 MG/ML IJ SOLN: 5.0000 mg | Freq: Once | INTRAMUSCULAR | Status: DC | PRN

## 2021-02-25 MED ORDER — SODIUM BICARBONATE 8.4 % IV SOLN: INTRAVENOUS | Status: DC | PRN

## 2021-02-25 MED ORDER — ACETAMINOPHEN 500 MG PO TABS
1000.0000 mg | ORAL_TABLET | Freq: Four times a day (QID) | ORAL | Status: DC
  Administered 2021-02-25 – 2021-02-27 (×8): 1000 mg via ORAL
  Filled 2021-02-25 (×10): qty 2

## 2021-02-25 MED ORDER — FENTANYL CITRATE (PF) 100 MCG/2ML IJ SOLN
INTRAMUSCULAR | Status: DC | PRN
  Administered 2021-02-25: 100 ug via EPIDURAL

## 2021-02-25 MED ORDER — MENTHOL 3 MG MT LOZG: 1.0000 | LOZENGE | OROMUCOSAL | Status: DC | PRN

## 2021-02-25 MED ORDER — OXYCODONE HCL 5 MG PO TABS
5.0000 mg | ORAL_TABLET | ORAL | Status: DC | PRN
  Administered 2021-02-26 (×3): 5 mg via ORAL
  Filled 2021-02-25 (×3): qty 1

## 2021-02-25 MED ORDER — KETOROLAC TROMETHAMINE 30 MG/ML IJ SOLN
30.0000 mg | Freq: Once | INTRAMUSCULAR | Status: AC | PRN
  Administered 2021-02-25: 30 mg via INTRAVENOUS

## 2021-02-25 MED ORDER — PHENYLEPHRINE HCL (PRESSORS) 10 MG/ML IV SOLN
INTRAVENOUS | Status: DC | PRN
  Administered 2021-02-25: 40 ug via INTRAVENOUS

## 2021-02-25 MED ORDER — DEXMEDETOMIDINE (PRECEDEX) IN NS 20 MCG/5ML (4 MCG/ML) IV SYRINGE
PREFILLED_SYRINGE | INTRAVENOUS | Status: AC
  Filled 2021-02-25: qty 5

## 2021-02-25 MED ORDER — SCOPOLAMINE 1 MG/3DAYS TD PT72: 1.0000 | MEDICATED_PATCH | Freq: Once | TRANSDERMAL | Status: DC

## 2021-02-25 MED ORDER — OXYTOCIN-SODIUM CHLORIDE 30-0.9 UT/500ML-% IV SOLN
INTRAVENOUS | Status: AC
  Filled 2021-02-25: qty 500

## 2021-02-25 MED ORDER — NALOXONE HCL 0.4 MG/ML IJ SOLN: 0.4000 mg | INTRAMUSCULAR | Status: DC | PRN

## 2021-02-25 MED ORDER — SIMETHICONE 80 MG PO CHEW
80.0000 mg | CHEWABLE_TABLET | Freq: Three times a day (TID) | ORAL | Status: DC
  Administered 2021-02-25 – 2021-02-27 (×5): 80 mg via ORAL
  Filled 2021-02-25 (×5): qty 1

## 2021-02-25 MED ORDER — OXYTOCIN-SODIUM CHLORIDE 30-0.9 UT/500ML-% IV SOLN
2.5000 [IU]/h | INTRAVENOUS | Status: AC
  Administered 2021-02-25: 2.5 [IU]/h via INTRAVENOUS
  Filled 2021-02-25: qty 500

## 2021-02-25 MED ORDER — TETANUS-DIPHTH-ACELL PERTUSSIS 5-2.5-18.5 LF-MCG/0.5 IM SUSY: 0.5000 mL | PREFILLED_SYRINGE | Freq: Once | INTRAMUSCULAR | Status: DC

## 2021-02-25 MED ORDER — FENTANYL CITRATE (PF) 100 MCG/2ML IJ SOLN: 25.0000 ug | INTRAMUSCULAR | Status: DC | PRN

## 2021-02-25 MED ORDER — DIBUCAINE (PERIANAL) 1 % EX OINT: 1.0000 "application " | TOPICAL_OINTMENT | CUTANEOUS | Status: DC | PRN

## 2021-02-25 MED ORDER — ONDANSETRON HCL 4 MG/2ML IJ SOLN
INTRAMUSCULAR | Status: AC
  Filled 2021-02-25: qty 2

## 2021-02-25 MED ORDER — CEFAZOLIN SODIUM-DEXTROSE 2-4 GM/100ML-% IV SOLN
2.0000 g | INTRAVENOUS | Status: AC
  Administered 2021-02-25: 2 g via INTRAVENOUS

## 2021-02-25 MED ORDER — BISACODYL 10 MG RE SUPP: 10.0000 mg | Freq: Every day | RECTAL | Status: DC | PRN

## 2021-02-25 MED ORDER — DEXAMETHASONE SODIUM PHOSPHATE 10 MG/ML IJ SOLN
INTRAMUSCULAR | Status: DC | PRN
  Administered 2021-02-25: 10 mg via INTRAVENOUS

## 2021-02-25 MED ORDER — SODIUM CHLORIDE 0.9 % IV SOLN
500.0000 mg | INTRAVENOUS | Status: AC
  Administered 2021-02-25: 500 mg via INTRAVENOUS

## 2021-02-25 SURGICAL SUPPLY — 35 items
APL SKNCLS STERI-STRIP NONHPOA (GAUZE/BANDAGES/DRESSINGS) ×1
BENZOIN TINCTURE PRP APPL 2/3 (GAUZE/BANDAGES/DRESSINGS) ×2 IMPLANT
CLAMP CORD UMBIL (MISCELLANEOUS) ×2 IMPLANT
CLIP FILSHIE TUBAL LIGA STRL (Clip) ×1 IMPLANT
CLOTH BEACON ORANGE TIMEOUT ST (SAFETY) ×2 IMPLANT
DRSG OPSITE POSTOP 4X10 (GAUZE/BANDAGES/DRESSINGS) ×2 IMPLANT
ELECT REM PT RETURN 9FT ADLT (ELECTROSURGICAL) ×2
ELECTRODE REM PT RTRN 9FT ADLT (ELECTROSURGICAL) ×1 IMPLANT
EXTRACTOR VACUUM BELL STYLE (SUCTIONS) ×2 IMPLANT
GAUZE SPONGE 4X4 12PLY STRL LF (GAUZE/BANDAGES/DRESSINGS) ×2 IMPLANT
GLOVE SURG LTX SZ6 (GLOVE) ×2 IMPLANT
GLOVE SURG UNDER POLY LF SZ6.5 (GLOVE) ×2 IMPLANT
GOWN STRL REUS W/TWL LRG LVL3 (GOWN DISPOSABLE) ×4 IMPLANT
HEMOSTAT SURGICEL 4X8 (HEMOSTASIS) ×1 IMPLANT
KIT ABG SYR 3ML LUER SLIP (SYRINGE) ×2 IMPLANT
NEEDLE HYPO 25X5/8 SAFETYGLIDE (NEEDLE) ×2 IMPLANT
NS IRRIG 1000ML POUR BTL (IV SOLUTION) ×2 IMPLANT
PACK C SECTION WH (CUSTOM PROCEDURE TRAY) ×2 IMPLANT
PAD ABD 7.5X8 STRL (GAUZE/BANDAGES/DRESSINGS) ×2 IMPLANT
PAD OB MATERNITY 4.3X12.25 (PERSONAL CARE ITEMS) ×2 IMPLANT
PENCIL SMOKE EVAC W/HOLSTER (ELECTROSURGICAL) ×2 IMPLANT
RTRCTR C-SECT PINK 25CM LRG (MISCELLANEOUS) ×2 IMPLANT
SPONGE GAUZE 4X4 12PLY STER LF (GAUZE/BANDAGES/DRESSINGS) ×2 IMPLANT
STRIP CLOSURE SKIN 1/2X4 (GAUZE/BANDAGES/DRESSINGS) ×2 IMPLANT
SUT MNCRL 0 VIOLET CTX 36 (SUTURE) ×2 IMPLANT
SUT MONOCRYL 0 CTX 36 (SUTURE) ×4
SUT PLAIN 2 0 (SUTURE) ×2
SUT PLAIN ABS 2-0 CT1 27XMFL (SUTURE) IMPLANT
SUT VIC AB 0 CT1 36 (SUTURE) ×4 IMPLANT
SUT VIC AB 3-0 CT1 27 (SUTURE) ×2
SUT VIC AB 3-0 CT1 TAPERPNT 27 (SUTURE) ×1 IMPLANT
SUT VIC AB 4-0 KS 27 (SUTURE) ×2 IMPLANT
TOWEL OR 17X24 6PK STRL BLUE (TOWEL DISPOSABLE) ×2 IMPLANT
TRAY FOLEY W/BAG SLVR 14FR LF (SET/KITS/TRAYS/PACK) ×2 IMPLANT
WATER STERILE IRR 1000ML POUR (IV SOLUTION) ×2 IMPLANT

## 2021-02-25 NOTE — Op Note (Signed)
CESAREAN SECTION Procedure Note  Patient: Stefanie White is a 30 y.o. G1P1001 @ 107w2d  Preoperative Diagnosis:  Intrauterine pregnancy at 41 weeks 2 days Maternal request for cesarean due to discomfort  Postoperative Diagnosis: same, delivered  Procedure: Primary low transverse cesarean     Surgeon: Charlett Nose , MD  Anesthesia: General anesthesia   Findings: Normal appearing uterus, fallopian tubes bilaterally, and ovaries bilaterally.  Viable female infant in left occiput transverse presentation delivered at 0656 with weight 3615g (7pounds 15.5ounces) Apgars 9 and 10.  Estimated Blood Loss:          Specimens: Placenta to L&D for disposal         Complications:  None         Disposition: PACU - hemodynamically stable.         Condition: stable    Description of Procedure: The patient was taken to the operating room where epidural was rebolused.  The patient was placed in the dorsal supine position.  Fetal heart tones were confirmed. Thromboguards were applied and cycling. A foley catheter was inserted and draining. Ancef 2g was given for infection prophylaxis. The patient was subsequently prepped and draped in the normal sterile fashion.  Allis clamp was used to test anesthesia of surgical field and patient complained of sharp pain on her right side. After long conversation with anesthesiologist about her course and difficulty with epidurals and spinals, decision was made to proceed with general anesthesia.   A low transverse skin incision was made with a scalpel and carried down to the level of the fascia.  The fascia was incised in the midline with the scalpel and extended laterally with curved Mayo scissors.  Kocher clamps were applied to the inferior fascial edge and the fascia was dissected off the rectus muscle sharply using the Mayo scissors.  The Kocher clamps were transferred to the superior fascial edge and the underlying rectus muscle was dissected off with  curved Mayo's scissors.  The rectus muscles then were separated in the midline.  The peritoneum was found free of adherent bowel and the peritoneal cavity was entered with Metzenbaum scissors.  The uterus was identified and the alexis retractor was placed intraperitoneal.  The bladder was noted to be low and distant from the surgical field..   A low transverse hysterotomy was then made with a scalpel.  The infant was found in the vertex presentation was delivered atraumatically and without difficulty with standard maneuvers. The cord was immediately clamped and cut, and the infant was handed off to the pediatricians.  The placenta was delivered with gentle traction on umbilical cord and manual massage of the uterine fundus.  The uterus was cleared of all clot and debris.  The hysterotomy was then closed with 0 monocryl in a running locked fashion,  followed by 0 Monocryl in an imbricating fashion. A single figure of 8 stitch of 3-0 vicryl was used to obtain  hemostasis at the left angle of the hysterotomy. The hysterotomy was found to be hemostatic. The fascia was elevated and rectus muscles inspected. Hemostasis of the rectus musces was obtained with cautery and Surgicel. The fascia was closed with a 0 Vicryl suture in a continuous running fashion.  The subcutaneous tissue was irrigated and rendered hemostatic with cautery.  The subcutaneous layer was subsequently closed with 2-0 plain in a continuous running fashion in 2 layers.  The skin was closed with 4-0 vicryl  in a running subcuticular fashion.  Sponge, lap  and needle counts were correct. Steri strips and a Honeycomb dressing were placed on the incision and a pressure dressing was applied  Charlett Nose 02/25/21  7:53 AM

## 2021-02-25 NOTE — Progress Notes (Signed)
OB Progress Note  S: Patient very uncomfortable, requesting cesarean, "I can't do this anymore"   O: Today's Vitals   02/25/21 0310 02/25/21 0323 02/25/21 0326 02/25/21 0330  BP:    (!) 124/46  Pulse:    90  Resp:      Temp:      TempSrc:      SpO2:      Weight:      Height:      PainSc: 4  7  9      Body mass index is 37.62 kg/m.  SVE 6.5/80/-1  FHR: 125bpm, minimal variability, absent accels, early decels Toco: ctx irregular q 2-6 mins   A/P: 30Y G1P0 @ [redacted]w[redacted]d, IOL late term Fetal well being: cat 2 tracing, overall reassuring IOL: s/p cytotec x 1, s/p AROM. no progress from 0100 exam however pitocin has been off for more than 1 of the last 3 hours due to patient discomfort and contractions are inadequate Pain control: over the last several hours, epidural was rebolused, then CSE placed. She had relief with the CSE for about 1.5 hours, now complaining of severe pain again and requesting a cesarean due to discomfort. OB anesthesiologist suggests one more attempt at rebolus with the new epidural in place. If no relief with this, would proceed with cesarean for maternal discomfort. 4.  Elevated Bps: mild range noted with patient discomfort, normotensive when she is comfortable, will continue to monitor  M. [redacted]w[redacted]d, MD 02/25/21 4:10 AM

## 2021-02-25 NOTE — Anesthesia Postprocedure Evaluation (Signed)
Anesthesia Post Note  Patient: Stefanie White  Procedure(s) Performed: CESAREAN SECTION     Patient location during evaluation: Mother Baby Anesthesia Type: General Level of consciousness: oriented and awake and alert Pain management: pain level controlled Vital Signs Assessment: post-procedure vital signs reviewed and stable Respiratory status: spontaneous breathing and respiratory function stable Cardiovascular status: blood pressure returned to baseline and stable Postop Assessment: no headache, no backache, no apparent nausea or vomiting and able to ambulate Anesthetic complications: no   No notable events documented.  Last Vitals:  Vitals:   02/25/21 0913 02/25/21 1024  BP: 132/77 125/71  Pulse: (!) 102 90  Resp: 20 18  Temp: 36.8 C 36.9 C  SpO2: 97% 95%    Last Pain:  Vitals:   02/25/21 1020  TempSrc:   PainSc: 0-No pain   Pain Goal: Patients Stated Pain Goal: 3 (02/24/21 2214)              Epidural/Spinal Function Cutaneous sensation: Normal sensation (02/25/21 1020), Patient able to flex knees: Yes (02/25/21 1020), Patient able to lift hips off bed: Yes (02/25/21 1020), Back pain beyond tenderness at insertion site: No (02/25/21 1020), Progressively worsening motor and/or sensory loss: No (02/25/21 1020), Bowel and/or bladder incontinence post epidural: No (02/25/21 1020)  Trevor Iha

## 2021-02-25 NOTE — Transfer of Care (Signed)
Immediate Anesthesia Transfer of Care Note  Patient: Stefanie White  Procedure(s) Performed: CESAREAN SECTION  Patient Location: PACU  Anesthesia Type:General and Epidural  Level of Consciousness: awake, alert  and oriented  Airway & Oxygen Therapy: Patient Spontanous Breathing and Patient connected to nasal cannula oxygen  Post-op Assessment: Report given to RN and Post -op Vital signs reviewed and stable  Post vital signs: Reviewed and stable  Last Vitals:  Vitals Value Taken Time  BP 131/79 02/25/21 0800  Temp    Pulse 112 02/25/21 0802  Resp 14 02/25/21 0802  SpO2 100 % 02/25/21 0802  Vitals shown include unvalidated device data.  Last Pain:  Vitals:   02/25/21 0438  TempSrc:   PainSc: Asleep      Patients Stated Pain Goal: 3 (02/24/21 2214)  Complications: No notable events documented.

## 2021-02-25 NOTE — Lactation Note (Signed)
This note was copied from a baby's chart. Lactation Consultation Note  Patient Name: Stefanie White DGLOV'F Date: 02/25/2021   Age:29 hours  LC worked with Mom to get deep latch placing infant in prone position.   Mom given breast shells to wear to elongate her nipple.   Plan 1. To feed based on cues 8-12x 24 hr period. Mom to offer breast and look for signs of milk transfer 2. If unable to latch, Mom can offer EBM via spoon.  3. I and O sheet reviewed. 4. LC brochure of inpatient and outpatient services reviewed.   All questions answered at the end of the visit.   Maternal Data    Feeding    LATCH Score                    Lactation Tools Discussed/Used    Interventions    Discharge    Consult Status      Stefanie Grover  White 02/25/2021, 1:38 PM

## 2021-02-25 NOTE — Progress Notes (Signed)
Had short relief with epidural rebolus and now again in pain. Patient has refused to turn pitocin back on and requesting cesarean. She refuses cervical exam to check dilation.   Fetal status has been reassuring throughout.    Will proceed with primary cesarean for maternal request.  Reviewed risks of infection, bleeding, blood transfusion, hysterectomy, damage to surrounding organs. She states understanding and wishes to proceed. All questions answered.   Ancef 2g + Azithromycin 500mg   OR staff notified  M. , MD 02/25/21 5:37 AM

## 2021-02-26 LAB — CBC
HCT: 23.8 % — ABNORMAL LOW (ref 36.0–46.0)
Hemoglobin: 8 g/dL — ABNORMAL LOW (ref 12.0–15.0)
MCH: 29.3 pg (ref 26.0–34.0)
MCHC: 33.6 g/dL (ref 30.0–36.0)
MCV: 87.2 fL (ref 80.0–100.0)
Platelets: 191 10*3/uL (ref 150–400)
RBC: 2.73 MIL/uL — ABNORMAL LOW (ref 3.87–5.11)
RDW: 14 % (ref 11.5–15.5)
WBC: 16.9 10*3/uL — ABNORMAL HIGH (ref 4.0–10.5)
nRBC: 0 % (ref 0.0–0.2)

## 2021-02-26 MED ORDER — FERROUS SULFATE 325 (65 FE) MG PO TABS
325.0000 mg | ORAL_TABLET | Freq: Two times a day (BID) | ORAL | Status: DC
  Administered 2021-02-26 – 2021-02-27 (×2): 325 mg via ORAL
  Filled 2021-02-26 (×2): qty 1

## 2021-02-26 MED ORDER — MEASLES, MUMPS & RUBELLA VAC IJ SOLR
0.5000 mL | Freq: Once | INTRAMUSCULAR | Status: AC
  Administered 2021-02-27: 0.5 mL via SUBCUTANEOUS
  Filled 2021-02-26: qty 0.5

## 2021-02-26 NOTE — Lactation Note (Signed)
This note was copied from a baby's chart. Lactation Consultation Note  Patient Name: Stefanie White Date: 02/26/2021 Reason for consult: Follow-up assessment;Primapara;1st time breastfeeding;Term Age:29 hours   P1 mother whose infant is now 30 hours old.  This is a term baby at 41=2 weeks.  Baby was asleep in mother's arms when I arrived; family awaiting photographer.    Mother had no questions/concerns related to breast feeding.  Mother reported baby has been latching and feeding well. Last LATCH score was a 7.  Baby is voiding/stooling.  Mother will continue to feed 8-12 times/24 hours or sooner if baby shows feeding cues.  Encouraged to call for lactation assistance as needed.  Father and grandmother present and supportive.  Mother has a DEBP for home use.   Maternal Data Has patient been taught Hand Expression?: Yes Does the patient have breastfeeding experience prior to this delivery?: No  Feeding Mother's Current Feeding Choice: Breast Milk  LATCH Score Latch: Repeated attempts needed to sustain latch, nipple held in mouth throughout feeding, stimulation needed to elicit sucking reflex.  Audible Swallowing: A few with stimulation  Type of Nipple: Everted at rest and after stimulation  Comfort (Breast/Nipple): Soft / non-tender  Hold (Positioning): Assistance needed to correctly position infant at breast and maintain latch.  LATCH Score: 7   Lactation Tools Discussed/Used Tools: Shells;Coconut oil  Interventions Interventions: Education  Discharge Pump: Personal WIC Program: No  Consult Status Consult Status: Follow-up Date: 02/20/21 Follow-up type: In-patient    Stefanie White 02/26/2021, 2:50 PM

## 2021-02-27 NOTE — Discharge Summary (Signed)
Postpartum Discharge Summary     Patient Name: Stefanie White DOB: July 25, 1990 MRN: [id number]  Date of admission: 02/24/2021 Delivery date:02/25/2021  Delivering provider: Irene Pap E  Date of discharge: 02/27/2021  Admitting diagnosis: [redacted] weeks gestation of pregnancy [Z3A.41] Intrauterine pregnancy: [redacted]w[redacted]d    Secondary diagnosis:  Active Problems:   [redacted] weeks gestation of pregnancy  Additional problems: obesity    Discharge diagnosis: Term Pregnancy Delivered                                              Post partum procedures: none Augmentation: AROM, Pitocin, and Cytotec Complications: None  Hospital course: Onset of Labor With Unplanned C/S   30y.o. yo G1P1001 at 414w2das admitted in Latent Labor on 02/24/2021. Patient had a labor course significant for poorly controlled pain with epidural. The patient went for cesarean section due to  maternal request . Delivery details as follows: Membrane Rupture Time/Date: 1:42 PM ,02/24/2021   Delivery Method:C-Section, Low Transverse  Details of operation can be found in separate operative note. Patient had an uncomplicated postpartum course.  She is ambulating,tolerating a regular diet, passing flatus, and urinating well.  Patient is discharged home in stable condition 02/27/21.  Newborn Data: Birth date:02/25/2021  Birth time:6:56 AM  Gender:Female  Living status:Living  Apgars:9 ,10  Weight:3615 g   Magnesium Sulfate received: No BMZ received: No Rhophylac:N/A MMR:N/A T-DaP:Given prenatally Flu: N/A Transfusion:No  Physical exam  Vitals:   02/26/21 0417 02/26/21 1500 02/26/21 2240 02/27/21 0420  BP: (!) 108/57 116/60 (!) 108/57 114/60  Pulse: 79 78 79 73  Resp: '18 20 18 18  ' Temp: 97.7 F (36.5 C) 97.7 F (36.5 C) 97.7 F (36.5 C) 97.7 F (36.5 C)  TempSrc: Oral Oral Oral Oral  SpO2: 100%  100% 100%  Weight:      Height:       General: alert, cooperative, and no distress Lochia: appropriate Uterine  Fundus: firm Incision: Healing well with no significant drainage DVT Evaluation: No evidence of DVT seen on physical exam. Labs: Lab Results  Component Value Date   WBC 16.9 (H) 02/26/2021   HGB 8.0 (L) 02/26/2021   HCT 23.8 (L) 02/26/2021   MCV 87.2 02/26/2021   PLT 191 02/26/2021   CMP Latest Ref Rng & Units 08/17/2020  Glucose 70 - 99 mg/dL 81  BUN 6 - 20 mg/dL <5(L)  Creatinine 0.44 - 1.00 mg/dL 0.51  Sodium 135 - 145 mmol/L 133(L)  Potassium 3.5 - 5.1 mmol/L 3.9  Chloride 98 - 111 mmol/L 102  CO2 22 - 32 mmol/L 21(L)  Calcium 8.9 - 10.3 mg/dL 9.2  Total Protein 6.0 - 8.5 g/dL -  Total Bilirubin 0.0 - 1.2 mg/dL -  Alkaline Phos 39 - 117 IU/L -  AST 0 - 40 IU/L -  ALT 0 - 32 IU/L -   Edinburgh Score: Edinburgh Postnatal Depression Scale Screening Tool 02/25/2021  I have been able to laugh and see the funny side of things. 0  I have looked forward with enjoyment to things. 0  I have blamed myself unnecessarily when things went wrong. 1  I have been anxious or worried for no good reason. 0  I have felt scared or panicky for no good reason. 0  Things have been getting on top of me. 1  I  have been so unhappy that I have had difficulty sleeping. 0  I have felt sad or miserable. 0  I have been so unhappy that I have been crying. 0  The thought of harming myself has occurred to me. 0  Edinburgh Postnatal Depression Scale Total 2      After visit meds:  Allergies as of 02/27/2021       Reactions   Rizatriptan Swelling, Other (See Comments)   Mouth swelling        Medication List     STOP taking these medications    albuterol 108 (90 Base) MCG/ACT inhaler Commonly known as: VENTOLIN HFA   aspirin 81 MG chewable tablet   Carestart COVID-19 Home Test Kit Generic drug: COVID-19 At Home Antigen Test   guaiFENesin-dextromethorphan 100-10 MG/5ML syrup Commonly known as: ROBITUSSIN DM   loratadine 10 MG tablet Commonly known as: CLARITIN   ondansetron 8 MG  disintegrating tablet Commonly known as: ZOFRAN-ODT   phenylephrine 10 MG Tabs tablet Commonly known as: SUDAFED PE   promethazine 25 MG tablet Commonly known as: PHENERGAN   triamcinolone ointment 0.1 % Commonly known as: KENALOG       TAKE these medications    cetirizine 10 MG tablet Commonly known as: ZYRTEC Take 10 mg by mouth daily.   docusate sodium 100 MG capsule Commonly known as: COLACE Take 100 mg by mouth daily.   fluticasone 50 MCG/ACT nasal spray Commonly known as: FLONASE Place 1 spray into both nostrils 2 (two) times daily.   multivitamin-prenatal 27-0.8 MG Tabs tablet Take 1 tablet by mouth daily at 12 noon.               Discharge Care Instructions  (From admission, onward)           Start     Ordered   02/27/21 0000  Discharge wound care:       Comments: Remove 7 days from date of surgery   02/27/21 0946             Discharge home in stable condition Infant Feeding: Breast Infant Disposition:home with mother Discharge instruction: per After Visit Summary and Postpartum booklet. Activity: Advance as tolerated. Pelvic rest for 6 weeks.  Diet: routine diet Anticipated Birth Control: Unsure Postpartum Appointment:4 weeks Additional Postpartum F/U: Postpartum Depression checkup Future Appointments:No future appointments. Follow up Visit:  Follow-up Information     Rowland Lathe, MD Follow up in 4 week(s).   Specialty: Obstetrics and Gynecology Contact information: [address] [place] [address] [phone]                     02/27/2021 Allyn Kenner, DO

## 2021-02-27 NOTE — Lactation Note (Signed)
This note was copied from a baby's chart. Lactation Consultation Note  Patient Name: Stefanie White [id number] Date: 02/27/2021 Reason for consult: Follow-up assessment;Primapara;1st time breastfeeding;Term Age:29 hours  Baby asleep on back in bassinet, female resting on couch. Mom sitting in bed, states BF going better today, reports baby with difficulty latching to breast overnight, therefore used hand pump x2 and syringe fed colostrum back to baby. States today baby is latching without difficulty. Mom reports plans to pump when ready to return to work.  Reviewed signs of adequate milk transfer, expected number of wet and stool diapers, feed on cue, f/u with LC at peds office or [facility] for feeding questions/ concerns. Mom aware to call [facility] if desires latch assessment prior to discharge. [facility] employee pump given Gaffer).  Plan - feed on cue 8-12 times a day - monitor wet and stool diapers as indicator of adequate milk transfer - call for latch assessment if desired prior to dicharge  Feeding Mother's Current Feeding Choice: Breast Milk   Interventions Interventions: Breast feeding basics reviewed    Consult Status Consult Status: Complete Date: 02/27/21 Follow-up type: Out-patient (for routine LC support)    Charlynn Court 02/27/2021, 9:54 AM

## 2021-03-09 ENCOUNTER — Telehealth (HOSPITAL_COMMUNITY): Payer: Self-pay | Admitting: *Deleted

## 2021-03-09 NOTE — Telephone Encounter (Signed)
Hospital Discharge Follow-Up Call:  Patient reports that she is doing well overall.  Had questions related to vaginal bleeding.  Advised about the normal patterns and parameters for calling her provider.  EPDS today was 6 (question 10 was 0).  Patient says she thinks she has had the [facility] and has appreciated the support of her husband.  Reviewed Baby Blues and PMADs and encouraged her to reach out to her provider if her symptoms persist past 2-3 weeks.  Patient reports that her baby is doing well and "sleeping through the night" up to 5 hours at a time overnight.  Next pediatrician appointment is tomorrow.  Asked her to discuss the baby's sleep patterns with the pediatrician and that pediatrician will use her description along with baby's weight to determine if she needs to adjust the baby's feeding plan.  She says that baby sleeps in a bassinet and she recited the ABCs of Safe Sleep.

## 2021-03-29 ENCOUNTER — Other Ambulatory Visit (HOSPITAL_COMMUNITY): Payer: Self-pay

## 2021-03-29 MED ORDER — NORETHINDRONE 0.35 MG PO TABS
ORAL_TABLET | ORAL | 3 refills | Status: DC
  Filled 2021-03-29: qty 84, 84d supply, fill #0
  Filled 2021-06-28: qty 84, 84d supply, fill #1
  Filled 2021-10-11: qty 84, 84d supply, fill #2
  Filled 2022-01-02: qty 84, 84d supply, fill #3

## 2021-06-28 ENCOUNTER — Other Ambulatory Visit (HOSPITAL_COMMUNITY): Payer: Self-pay

## 2021-10-11 ENCOUNTER — Other Ambulatory Visit (HOSPITAL_COMMUNITY): Payer: Self-pay

## 2021-11-01 IMAGING — US US MFM OB DETAIL+14 WK
1 series · 13 of 28 positions shown · non-contrast
Comparison: none

[Series 1: us mfm ob detail+14 wk · 98 acquisitions, 13 frames shown]
[im 4/98]
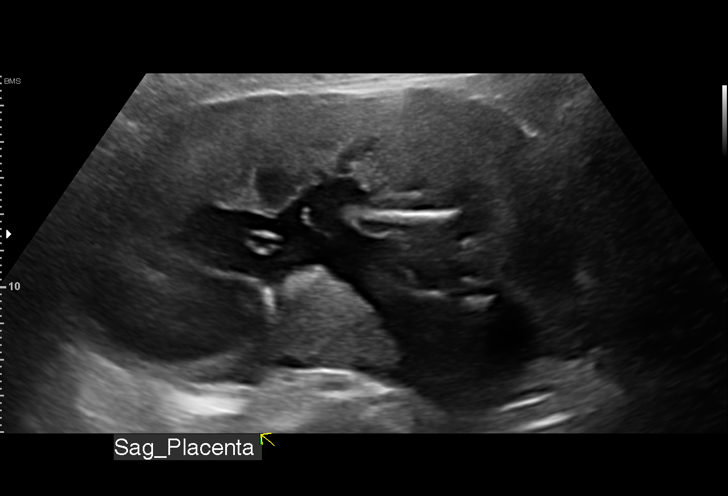
[im 11/98]
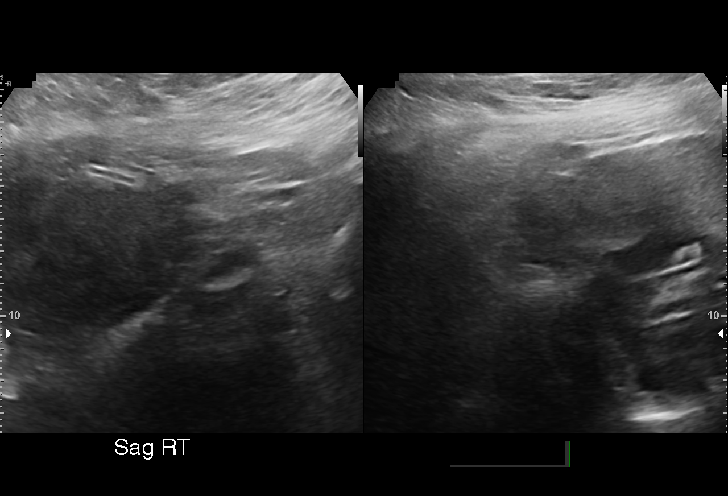
[im 18/98]
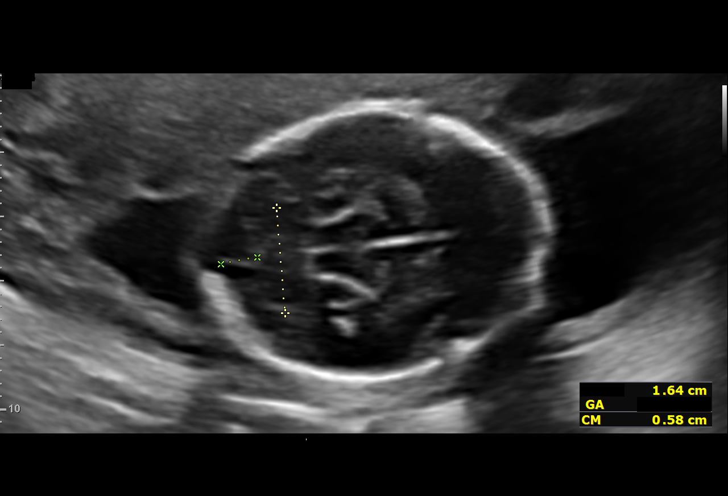
[im 26/98]
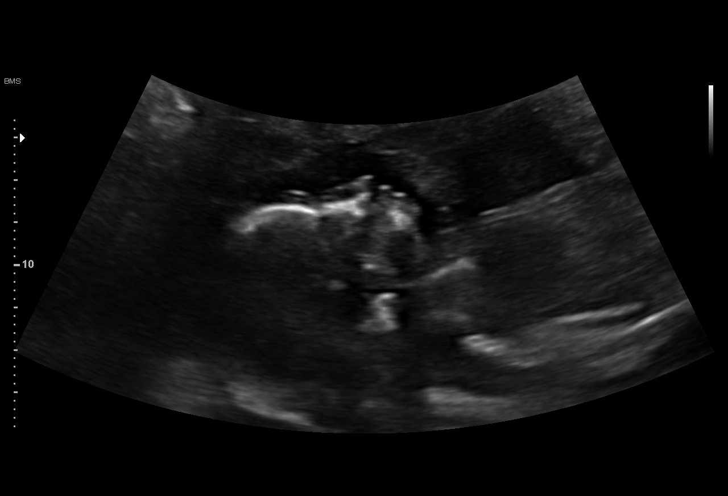
[im 33/98]
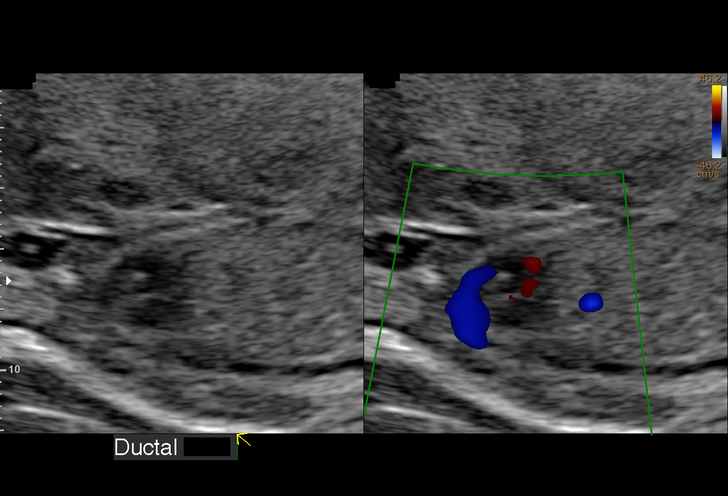
[im 40/98]
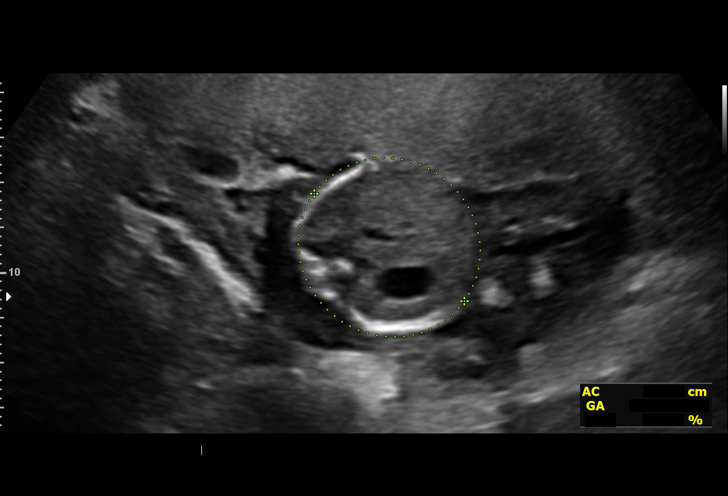
[im 51/98]
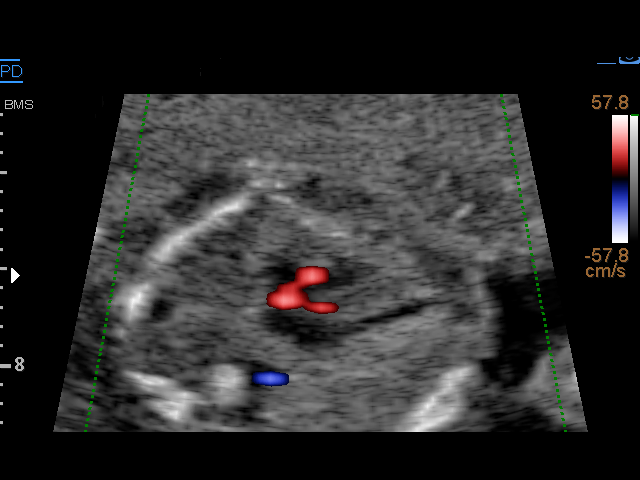
[im 58/98]
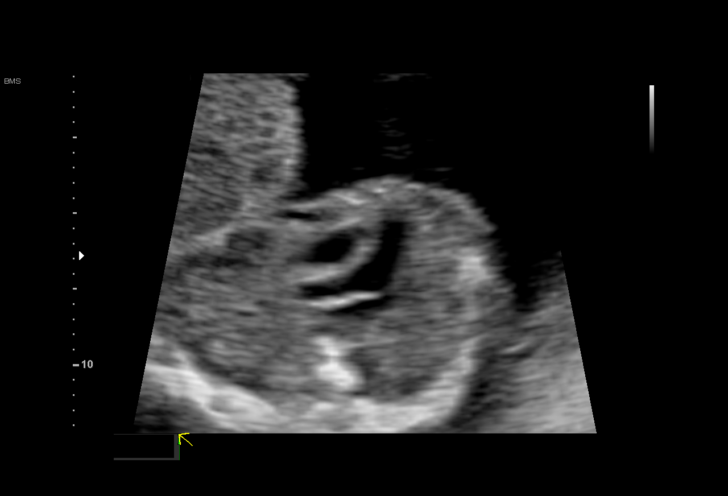
[im 65/98]
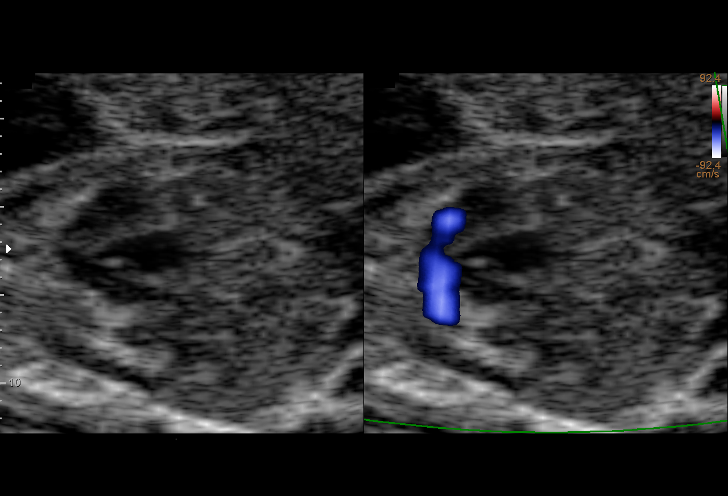
[im 72/98]
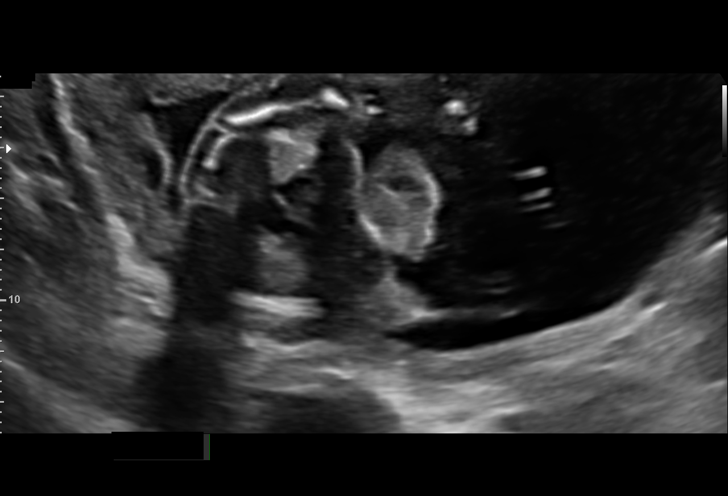
[im 80/98]
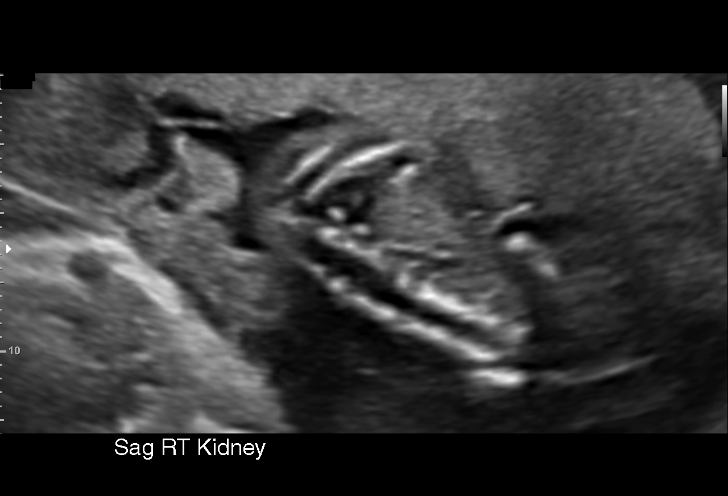
[im 87/98]
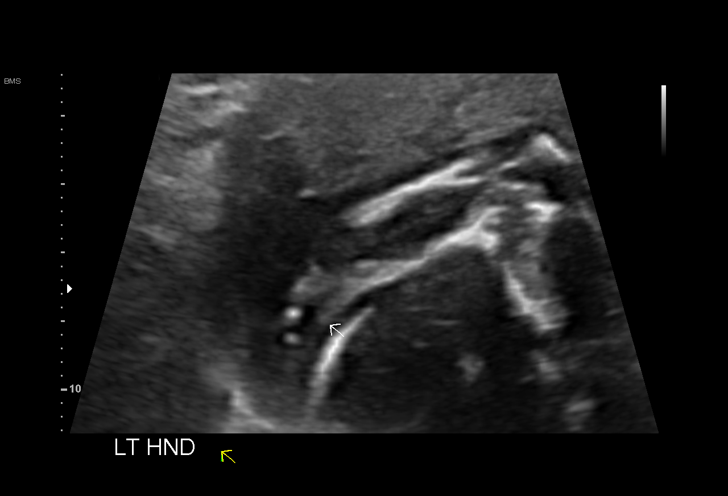
[im 94/98]
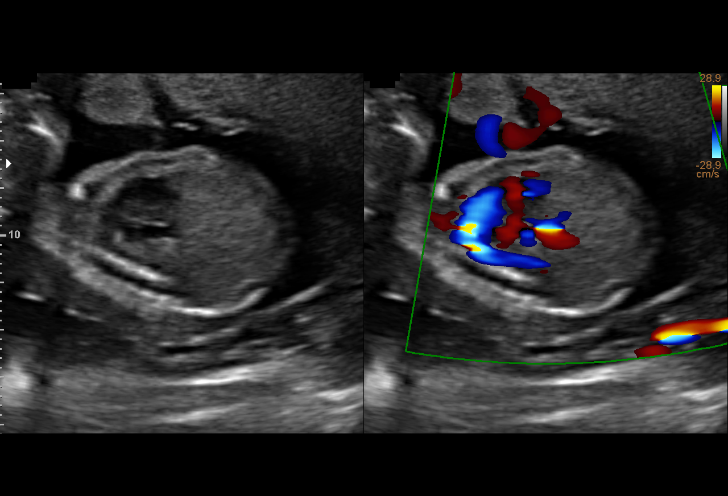

[13 of 28 positions shown; findings below may reference images not displayed]

Indications

 18 weeks gestation of pregnancy
 Encounter for antenatal screening for
 malformations (LR NIPS, Neg Horizon)
 Obesity complicating pregnancy, second
 trimester (BMI 34)
 Medical complication of pregnancy (Non-
 immune Rubella, Migraines, ADHD)
Fetal Evaluation

 Num Of Fetuses:         1
 Fetal Heart Rate(bpm):  137
 Cardiac Activity:       Observed
 Presentation:           Breech
 Placenta:               Anterior, low-lying, 1.5 cm from int os
 P. Cord Insertion:      Visualized, central

 Amniotic Fluid
 AFI FV:      Subjectively decreased

                             Largest Pocket(cm)

Biometry

 BPD:      42.7  mm     G. Age:  18w 6d         54  %    CI:         75.1   %    70 - 86
                                                         FL/HC:      17.0   %    16.1 -
 HC:      156.3  mm     G. Age:  18w 4d         27  %    HC/AC:      1.22        1.09 -
 AC:      127.6  mm     G. Age:  18w 2d         29  %    FL/BPD:     62.1   %
 FL:       26.5  mm     G. Age:  18w 0d         17  %    FL/AC:      20.8   %    20 - 24
 HUM:      25.9  mm     G. Age:  18w 1d         31  %
 CER:      17.9  mm     G. Age:  17w 6d        4.8  %
 NFT:       4.9  mm
 LV:        6.8  mm
 CM:        5.4  mm

 Est. FW:     231  gm      0 lb 8 oz     16  %
OB History

 Gravidity:    1
 Living:       0
Gestational Age

 LMP:           19w 6d        Date:  05/05/20                 EDD:   02/09/21
 U/S Today:     18w 3d                                        EDD:   02/19/21
 Best:          18w 6d     Det. By:  Early Ultrasound         EDD:   02/16/21
                                     (06/25/20)
Anatomy

 Cranium:               Appears normal         LVOT:                   Appears normal
 Cavum:                 Appears normal         Aortic Arch:            Appears normal
 Ventricles:            Appears normal         Ductal Arch:            Appears normal
 Choroid Plexus:        Appears normal         Diaphragm:              Appears normal
 Cerebellum:            Appears normal         Stomach:                Appears normal, left
                                                                       sided
 Posterior Fossa:       Appears normal         Abdomen:                Appears normal
 Nuchal Fold:           Appears normal         Abdominal Wall:         Appears nml (cord
                                                                       insert, abd wall)
 Face:                  Appears normal         Cord Vessels:           Appears normal (3
                        (orbits and profile)                           vessel cord)
 Lips:                  Not well visualized    Kidneys:                Appear normal
 Palate:                Not well visualized    Bladder:                Appears normal
 Thoracic:              Appears normal         Spine:                  Not well visualized
 Heart:                 Not well visualized    Upper Extremities:      Appears normal
 RVOT:                  Not well visualized    Lower Extremities:      Appears normal

 Other:  Technically difficult due to maternal habitus and fetal position. Fetus
         appears to be female.
Cervix Uterus Adnexa

 Cervix
 Length:           3.13  cm.
 Normal appearance by transabdominal scan.

 Uterus
 No abnormality visualized.

 Right Ovary
 Not visualized.

 Left Ovary
 Not visualized.
 Cul De Sac
 No free fluid seen.

 Adnexa
 No adnexal mass visualized.
Impression

 Single intrauterine pregnancy here for a detailed anatomy
 due to elevated BMI.
 Normal anatomy with measurements consistent with dates
 There is good fetal movement and objectively normal
 amniotic fluid volume, however the amniotic fluid is
 subjectively low
 Suboptimal views of the fetal anatomy were obtained
 secondary to fetal position and maternal habitus

 The placenta is low lying with the placental edge is 1.5 cm, I
 discussed this with Ms. Zeinab. I also explained that there is a
 slight increased risk for bleeding, however I reassured her
 that I expect that the placenta will likely clear the cervix.

 She is taking low dose ASA daily and has low risk NIPS.

 Lastly, we discussed the subjectively low fluid. She shared
 that she is likely dehydrated. Therefore, I recommended 1.5 L
 daily hydration.
Recommendations

 Follow up growth in 4-6 weeks to assess fetal growth.

## 2022-01-02 ENCOUNTER — Other Ambulatory Visit (HOSPITAL_COMMUNITY): Payer: Self-pay

## 2022-01-11 ENCOUNTER — Other Ambulatory Visit (HOSPITAL_COMMUNITY): Payer: Self-pay

## 2022-01-11 ENCOUNTER — Telehealth: Payer: No Typology Code available for payment source | Admitting: Physician Assistant

## 2022-01-11 DIAGNOSIS — J069 Acute upper respiratory infection, unspecified: Secondary | ICD-10-CM

## 2022-01-11 IMAGING — US US MFM OB FOLLOW-UP
1 series · 13 of 28 positions shown · non-contrast
Comparison: none

[Series 1: us mfm ob follow-up · 52 acquisitions, 13 frames shown]
[im 2/52]
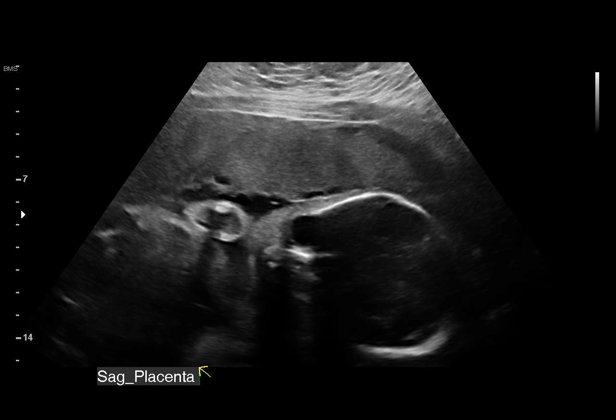
[im 6/52]
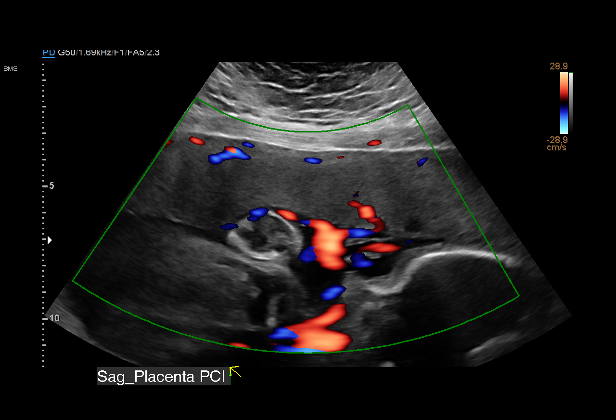
[im 10/52]
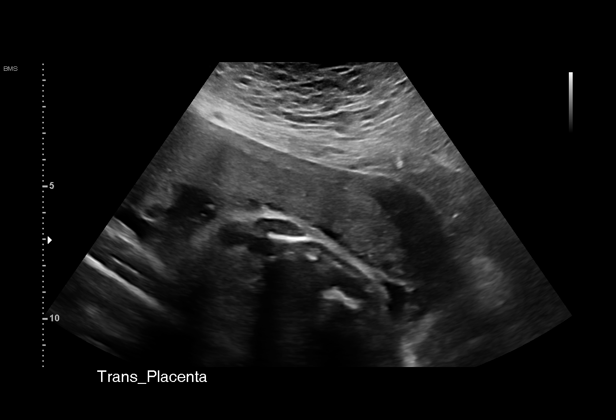
[im 14/52]
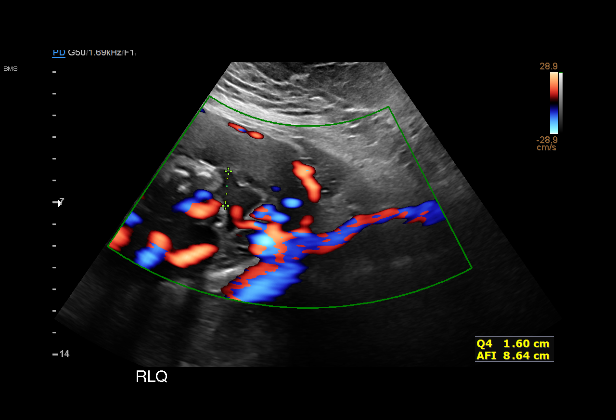
[im 18/52]
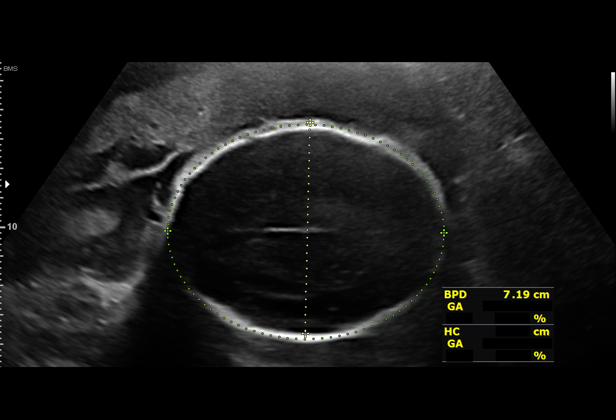
[im 21/52]
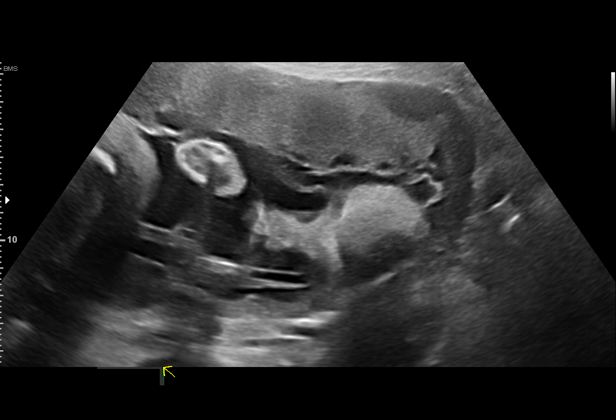
[im 27/52]
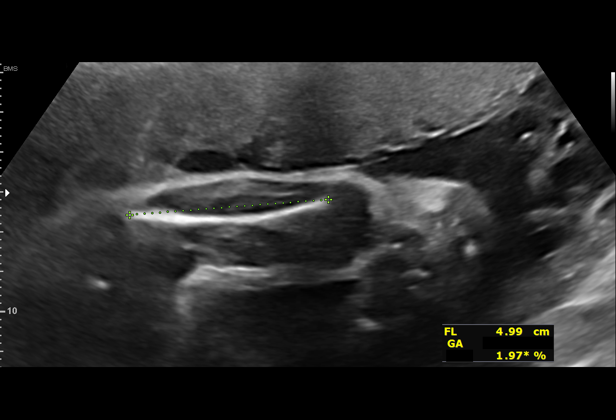
[im 31/52]
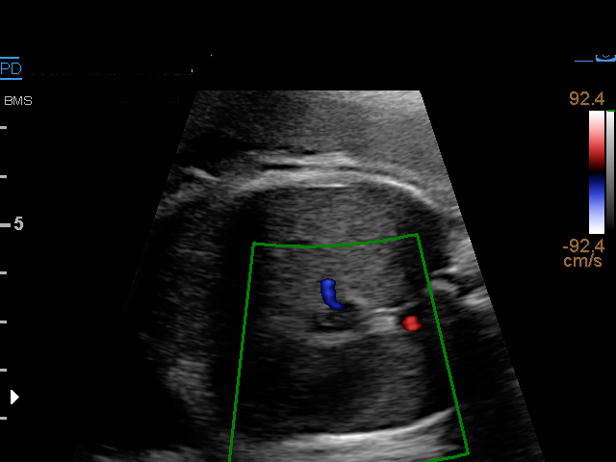
[im 35/52]
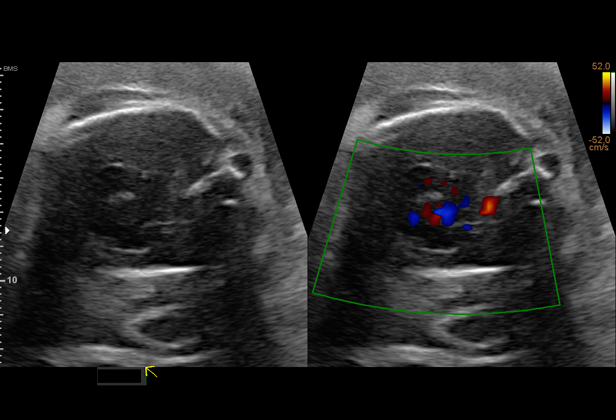
[im 38/52]
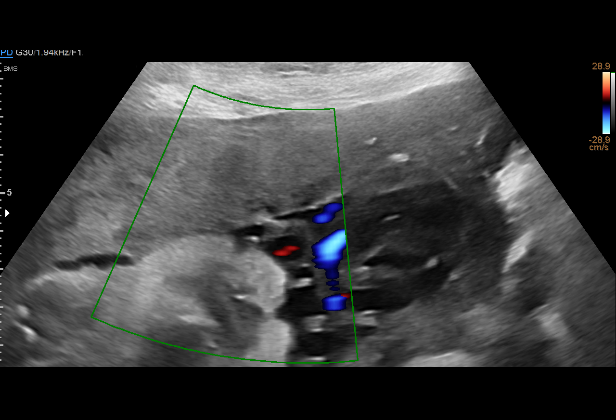
[im 42/52]
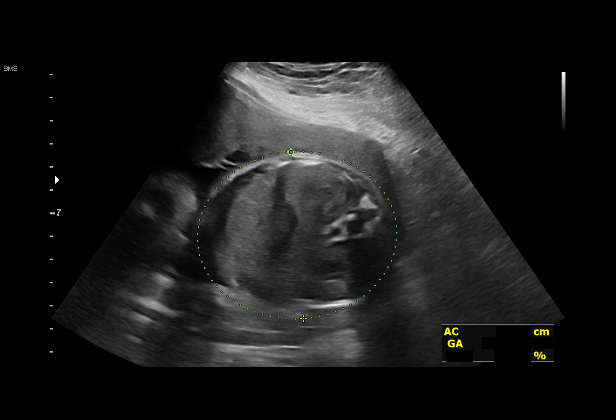
[im 46/52]
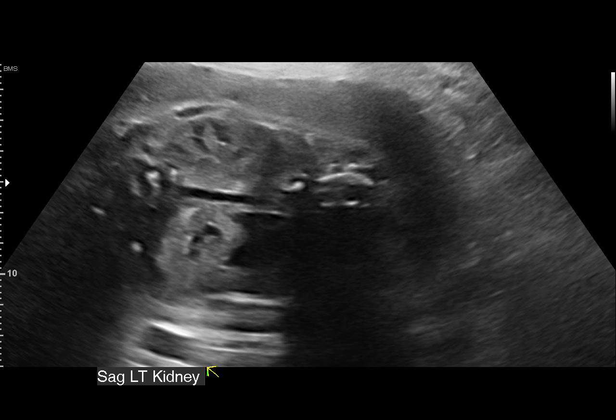
[im 50/52]
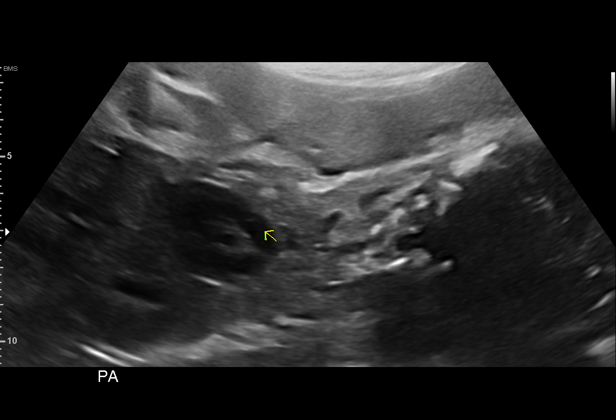

[13 of 28 positions shown; findings below may reference images not displayed]

Addendum:\.br----------------------------------------------------------------------
----------------------------------------------------------------------

----------------------------------------------------------------------

----------------------------------------------------------------------

----------------------------------------------------------------------

----------------------------------------------------------------------
Indications

 Antenatal follow-up for nonvisualized fetal
 anatomy
 Obesity complicating pregnancy, second
 trimester (BMI 34)
 Medical complication of pregnancy (Non-
 immune Rubella, Migraines, ADHD)
 LR NIPS/ Negative Horizon
 29 weeks gestation of pregnancy
----------------------------------------------------------------------
Fetal Evaluation

 Num Of Fetuses:          1
 Fetal Heart Rate(bpm):   148
 Cardiac Activity:        Observed
 Presentation:            Cephalic
 Placenta:                Anterior
 P. Cord Insertion:       Visualized

 Amniotic Fluid
 AFI FV:      Subjectively decreased

 AFI Sum(cm)     %Tile       Largest Pocket(cm)


 RUQ(cm)       RLQ(cm)        LUQ(cm)        LLQ(cm)
 5.07          1.6            1.97           0
----------------------------------------------------------------------
Biometry

 BPD:      72.5   mm     G. Age:  29w 1d         40  %    CI:         74.65  %    70 - 86
                                                          FL/HC:       19.1  %    19.6 -
 HC:      266.3   mm     G. Age:  29w 0d         18  %    HC/AC:       1.04       0.99 -
 AC:        257   mm     G. Age:  29w 6d         70  %    FL/BPD:      70.2  %    71 - 87
 FL:       50.9   mm     G. Age:  27w 2d          4  %    FL/AC:       19.8  %    20 - 24

 LV:         2.4  mm

 Est. FW:    0766   gm    2 lb 14 oz      33  %
----------------------------------------------------------------------
OB History

 Gravidity:     1
 Living:        0
----------------------------------------------------------------------
Gestational Age

 LMP:            30w 0d       Date:  05/05/20                   EDD:  02/09/21
 U/S Today:      28w 6d                                         EDD:  02/17/21
 Best:           29w 0d    Det. By:  Early Ultrasound           EDD:  02/16/21
                                     (06/25/20)
----------------------------------------------------------------------
Anatomy

 Cranium:                Appears normal         LVOT:                   Appears normal
 Cavum:                  Previously seen        Aortic Arch:            Previously seen
 Ventricles:             Appears normal         Ductal Arch:            Previously seen
 Choroid Plexus:         Previously seen        Diaphragm:              Appears normal
 Cerebellum:             Previously seen        Stomach:                Appears normal, left
                                                                        sided
 Posterior Fossa:        Previously seen        Abdomen:                Appears normal
 Nuchal Fold:            Previously seen        Abdominal Wall:         Previously seen
 Face:                   Orbits and profile     Cord Vessels:           Previously seen
                         previously seen
 Lips:                   Appears normal         Kidneys:                Appear normal
 Palate:                 Not well visualized    Bladder:                Appears normal
 Thoracic:               Previously seen        Spine:                  Ltd views no
                                                                        intracranial signs of
                                                                        NTD
 Heart:                  Previously seen        Upper Extremities:      Previously seen
 RVOT:                   Appears normal         Lower Extremities:      Previously seen

 Other:   Technically difficult due to maternal habitus and fetal position. Female
          gender previously seen.
----------------------------------------------------------------------
Cervix Uterus Adnexa

 Cervix
 Not visualized (advanced GA >09wks)

 Uterus
 No abnormality visualized.
 Right Ovary
 Not visualized.

 Left Ovary
 Not visualized.

 Cul De Sac
 No free fluid seen.

 Adnexa
 No adnexal mass visualized.
----------------------------------------------------------------------
Impression

 Patient returned for completion of fetal anatomy.
 Fetal growth is appropriate for gestational age. Amniotic fluid is
 decreased for this gestational age (no oligohydramnios). Fetal
 anatomical survey was completed and appears normal.
 I reassured the patient of the findings.
 Patient reports that she had abnormal 1 hour glucose challenge
 test and will be undergoing 3-hour GTT next week.
 xxxxxxxxxxxxxxxxxxxxx
 ADDENDUM (12/14/20): On transabdominal scan, there is no
 evidence of low-lying placenta or placenta previa.
----------------------------------------------------------------------
Recommendations

 -An appointment was made for her to return in 4 weeks for fetal
 growth and amniotic fluid assessments.
----------------------------------------------------------------------
                      Nolen, Nohely
----------------------------------------------------------------------

*** End of Addendum ***\.br----------------------------------------------------------------------
----------------------------------------------------------------------

----------------------------------------------------------------------

----------------------------------------------------------------------

                                                      DEBASREE
----------------------------------------------------------------------

----------------------------------------------------------------------
Indications

 Antenatal follow-up for nonvisualized fetal
 anatomy
 Obesity complicating pregnancy, second
 trimester (BMI 34)
 Medical complication of pregnancy (Non-
 immune Rubella, Migraines, ADHD)
 LR NIPS/ Negative Horizon
 29 weeks gestation of pregnancy
----------------------------------------------------------------------
Fetal Evaluation

 Num Of Fetuses:         1
 Fetal Heart Rate(bpm):  148
 Cardiac Activity:       Observed
 Presentation:           Cephalic
 Placenta:               Anterior
 P. Cord Insertion:      Visualized

 Amniotic Fluid
 AFI FV:      Subjectively decreased

 AFI Sum(cm)     %Tile       Largest Pocket(cm)


 RUQ(cm)       RLQ(cm)       LUQ(cm)        LLQ(cm)
 5.07          1.6           1.97           0
----------------------------------------------------------------------
Biometry

 BPD:      72.5  mm     G. Age:  29w 1d         40  %    CI:        74.65   %    70 - 86
                                                         FL/HC:      19.1   %    19.6 -
 HC:      266.3  mm     G. Age:  29w 0d         18  %    HC/AC:      1.04        0.99 -
 AC:       257   mm     G. Age:  29w 6d         70  %    FL/BPD:     70.2   %    71 - 87
 FL:       50.9  mm     G. Age:  27w 2d          4  %    FL/AC:      19.8   %    20 - 24

 LV:        2.4  mm

 Est. FW:    0766  gm    2 lb 14 oz      33  %
----------------------------------------------------------------------
OB History

 Gravidity:    1
 Living:       0
----------------------------------------------------------------------
Gestational Age

 LMP:           30w 0d        Date:  05/05/20                 EDD:   02/09/21
 U/S Today:     28w 6d                                        EDD:   02/17/21
 Best:          29w 0d     Det. By:  Early Ultrasound         EDD:   02/16/21
                                     (06/25/20)
----------------------------------------------------------------------
Anatomy

 Cranium:               Appears normal         LVOT:                   Appears normal
 Cavum:                 Previously seen        Aortic Arch:            Previously seen
 Ventricles:            Appears normal         Ductal Arch:            Previously seen
 Choroid Plexus:        Previously seen        Diaphragm:              Appears normal
 Cerebellum:            Previously seen        Stomach:                Appears normal, left
                                                                       sided
 Posterior Fossa:       Previously seen        Abdomen:                Appears normal
 Nuchal Fold:           Previously seen        Abdominal Wall:         Previously seen
 Face:                  Orbits and profile     Cord Vessels:           Previously seen
                        previously seen
 Lips:                  Appears normal         Kidneys:                Appear normal
 Palate:                Not well visualized    Bladder:                Appears normal
 Thoracic:              Previously seen        Spine:                  Ltd views no
                                                                       intracranial signs of
                                                                       NTD
 Heart:                 Previously seen        Upper Extremities:      Previously seen
 RVOT:                  Appears normal         Lower Extremities:      Previously seen

 Other:  Technically difficult due to maternal habitus and fetal position. Female
         gender previously seen.
----------------------------------------------------------------------
Cervix Uterus Adnexa

 Cervix
 Not visualized (advanced GA >09wks)

 Uterus
 No abnormality visualized.

 Right Ovary
 Not visualized.
 Left Ovary
 Not visualized.

 Cul De Sac
 No free fluid seen.

 Adnexa
 No adnexal mass visualized.
----------------------------------------------------------------------
Impression

 Patient returned for completion of fetal anatomy.
 Fetal growth is appropriate for gestational age. Amniotic fluid
 is decreased for this gestational age (no oligohydramnios).
 Fetal anatomical survey was completed and appears normal.
 I reassured the patient of the findings.
 Patient reports that she had abnormal 1 hour glucose
 challenge test and will be undergoing 3-hour GTT next week.

----------------------------------------------------------------------
Recommendations

 -An appointment was made for her to return in 4 weeks for
 fetal growth and amniotic fluid assessments.
----------------------------------------------------------------------
                 Nolen, Nohely
----------------------------------------------------------------------

## 2022-01-11 MED ORDER — FLUTICASONE PROPIONATE 50 MCG/ACT NA SUSP
2.0000 | Freq: Every day | NASAL | 0 refills | Status: DC
  Filled 2022-01-11: qty 16, 30d supply, fill #0
  Filled 2022-01-11: qty 16, fill #0

## 2022-01-11 MED ORDER — BENZONATATE 100 MG PO CAPS
100.0000 mg | ORAL_CAPSULE | Freq: Three times a day (TID) | ORAL | 0 refills | Status: DC | PRN
  Filled 2022-01-11: qty 30, 10d supply, fill #0

## 2022-01-11 NOTE — Progress Notes (Signed)

## 2022-01-11 NOTE — Progress Notes (Signed)
I have spent 5 minutes in review of e-visit questionnaire, review and updating patient chart, medical decision making and response to patient.   Kenia Teagarden Cody Roben Schliep, PA-C    

## 2022-01-16 ENCOUNTER — Other Ambulatory Visit (HOSPITAL_COMMUNITY): Payer: Self-pay

## 2022-01-16 ENCOUNTER — Encounter (HOSPITAL_COMMUNITY): Payer: Self-pay | Admitting: *Deleted

## 2022-01-16 ENCOUNTER — Ambulatory Visit (HOSPITAL_COMMUNITY)
Admission: EM | Admit: 2022-01-16 | Discharge: 2022-01-16 | Disposition: A | Discharge status: Home / Self Care | Payer: No Typology Code available for payment source

## 2022-01-16 DIAGNOSIS — J329 Chronic sinusitis, unspecified: Secondary | ICD-10-CM | POA: Diagnosis not present

## 2022-01-16 DIAGNOSIS — J40 Bronchitis, not specified as acute or chronic: Secondary | ICD-10-CM

## 2022-01-16 DIAGNOSIS — R051 Acute cough: Secondary | ICD-10-CM

## 2022-01-16 MED ORDER — AMOXICILLIN-POT CLAVULANATE 875-125 MG PO TABS
1.0000 | ORAL_TABLET | Freq: Two times a day (BID) | ORAL | 0 refills | Status: DC
  Filled 2022-01-16: qty 14, 7d supply, fill #0

## 2022-01-16 NOTE — Discharge Instructions (Addendum)
I am concerned that you have a sinus infection.  Please start Augmentin twice daily.  This will be passed through breastmilk so monitor baby for diarrhea, irritability, rash.  Use Mucinex.  Continue your Flonase.  Antibiotics can decrease the effectiveness of your birth control so please use backup birth control while on this medication.  If you have any worsening symptoms you need to be seen immediately.

## 2022-01-16 NOTE — ED Provider Notes (Signed)
[facility]    CSN: [id number] Arrival date & time: 01/16/22  0321      History   Chief Complaint Chief Complaint  Patient presents with   Cough   Nasal Congestion    HPI Stefanie White is a 31 y.o. female.   Patient presents today with a weeklong history of URI symptoms.  Reports she has taken 2 COVID test that were negative.  She has had COVID-vaccine has not had booster.  She completed a E-visit that that symptoms were viral in nature and she was started on Flonase.  She has been using this as well as decongestants and sinus rinses without improvement of symptoms.  She has significant pain with sinus rinse.  Using that regularly.  She does have allergies but denies any history of asthma, COPD.  She is a former smoker.  She does not believe she is pregnant.  She is breast-feeding.  Denies any recent antibiotic or steroid use.  She reports ongoing sinus pressure, headache, cough, otalgia, fatigue, malaise.    Past Medical History:  Diagnosis Date   Alopecia    Recurrent upper respiratory infection (URI)    Seasonal allergies     Patient Active Problem List   Diagnosis Date Noted   [redacted] weeks gestation of pregnancy 02/24/2021   High triglycerides 08/26/2019   Stress reaction 07/25/2019   Sensation of chest tightness 07/25/2019   Hypertriglyceridemia without hypercholesterolemia 07/25/2019   Low HDL (under 40) 07/25/2019   Alopecia 07/25/2019   Tachycardia 06/09/2018   Sinus headache 06/09/2018   Physical deconditioning 06/09/2018   Dietary counseling 06/09/2018   Exercise counseling 06/09/2018   Sleeping difficulties 01/17/2018   Contact dermatitis and eczema-bilateral axilla 10/11/2017   Attention and concentration deficit 08/29/2017   Carbuncle and furuncle of leg 03/08/2017   Vitamin D insufficiency 05/22/2016   Obesity, Class I, BMI 30-34.9 05/22/2016   Low serum HDL 05/22/2016   Acute left otitis media 03/03/2016   Otitis, externa, infective  03/03/2016   Pharyngitis 03/03/2016   Fever, unspecified 03/03/2016   Migraine headache 03/01/2016   History of ADHD 03/01/2016   Environmental and seasonal allergies 03/01/2016   Viral URI 03/01/2016   h/o Alopecia areata 05/22/2014    Past Surgical History:  Procedure Laterality Date   CESAREAN SECTION  02/25/2021   Procedure: CESAREAN SECTION;  Surgeon: Charlett Nose, MD;  Location: [facility];  Service: Obstetrics;;   NO PAST SURGERIES      OB History     Gravida  1   Para  1   Term  1   Preterm      AB      Living  1      SAB      IAB      Ectopic      Multiple  0   Live Births  1            Home Medications    Prior to Admission medications   Medication Sig Start Date End Date Taking? Authorizing Provider  amoxicillin-clavulanate (AUGMENTIN) 875-125 MG tablet Take 1 tablet by mouth every 12 (twelve) hours. 01/16/22  Yes Gennette Shadix K, PA-C  fluticasone (FLONASE) 50 MCG/ACT nasal spray Place 2 sprays into both nostrils daily. 01/11/22  Yes Waldon Merl, PA-C  loratadine (CLARITIN) 10 MG tablet Take 10 mg by mouth daily.   Yes [provider]  norethindrone (MICRONOR) 0.35 MG tablet Take 1 tablet by mouth every day.  03/29/21  Yes   Prenatal Vit-Fe Fumarate-FA (MULTIVITAMIN-PRENATAL) 27-0.8 MG TABS tablet Take 1 tablet by mouth daily at 12 noon.   Yes [provider]    Family History Family History  Problem Relation Age of Onset   Kidney Stones Mother    Diabetes Father 27   Hypertension Father 30   Depression Brother 10       bipolar   Cancer Maternal Aunt 24       colon   Heart attack Paternal Grandmother 60    Social History Social History   Tobacco Use   Smoking status: Former    Packs/day: 0.25    Years: 1.00    Total pack years: 0.25    Types: Cigarettes    Quit date: 06/12/2010    Years since quitting: 11.6   Smokeless tobacco: Never  Vaping Use   Vaping Use: Never used  Substance Use Topics    Alcohol use: Not Currently    Alcohol/week: 1.0 standard drink of alcohol    Types: 1 Glasses of wine per week    Comment: Socially   Drug use: No     Allergies   Rizatriptan   Review of Systems Review of Systems  Constitutional:  Positive for activity change and fatigue. Negative for appetite change and fever.  HENT:  Positive for congestion, postnasal drip, sinus pressure and sore throat. Negative for sneezing.   Respiratory:  Positive for cough. Negative for shortness of breath.   Cardiovascular:  Negative for chest pain.  Gastrointestinal:  Negative for abdominal pain, diarrhea, nausea and vomiting.  Neurological:  Positive for headaches. Negative for dizziness and light-headedness.     Physical Exam Triage Vital Signs ED Triage Vitals  Enc Vitals Group     BP 01/16/22 0842 123/66     Pulse Rate 01/16/22 0842 92     Resp 01/16/22 0842 18     Temp 01/16/22 0842 98.3 F (36.8 C)     Temp Source 01/16/22 0842 Oral     SpO2 01/16/22 0842 95 %     Weight --      Height --      Head Circumference --      Peak Flow --      Pain Score 01/16/22 0839 5     Pain Loc --      Pain Edu? --      Excl. in GC? --    No data found.  Updated Vital Signs BP 123/66 (BP Location: Right Arm)   Pulse 92   Temp 98.3 F (36.8 C) (Oral)   Resp 18   LMP 01/03/2022 (Approximate)   SpO2 95%   Visual Acuity Right Eye Distance:   Left Eye Distance:   Bilateral Distance:    Right Eye Near:   Left Eye Near:    Bilateral Near:     Physical Exam Vitals reviewed.  Constitutional:      General: She is awake. She is not in acute distress.    Appearance: Normal appearance. She is well-developed. She is not ill-appearing.     Comments: Very pleasant female appears stated age in no acute distress sitting comfortably in exam room  HENT:     Head: Normocephalic and atraumatic.     Right Ear: Ear canal and external ear normal. Tympanic membrane is scarred. Tympanic membrane is not  erythematous or bulging.     Left Ear: Ear canal and external ear normal. Tympanic membrane is scarred. Tympanic membrane is  not erythematous or bulging.     Nose:     Right Sinus: Maxillary sinus tenderness present. No frontal sinus tenderness.     Left Sinus: Maxillary sinus tenderness present. No frontal sinus tenderness.     Mouth/Throat:     Pharynx: Uvula midline. Posterior oropharyngeal erythema present. No oropharyngeal exudate.     Tonsils: No tonsillar exudate or tonsillar abscesses. 2+ on the right. 2+ on the left.  Cardiovascular:     Rate and Rhythm: Normal rate and regular rhythm.     Heart sounds: Normal heart sounds, S1 normal and S2 normal. No murmur heard. Pulmonary:     Effort: Pulmonary effort is normal.     Breath sounds: Normal breath sounds. No wheezing, rhonchi or rales.     Comments: Clear to auscultation bilaterally Lymphadenopathy:     Cervical: No cervical adenopathy.  Psychiatric:        Behavior: Behavior is cooperative.      UC Treatments / Results  Labs (all labs ordered are listed, but only abnormal results are displayed) Labs Reviewed - No data to display  EKG   Radiology No results found.  Procedures Procedures (including critical care time)  Medications Ordered in UC Medications - No data to display  Initial Impression / Assessment and Plan / UC Course  I have reviewed the triage vital signs and the nursing notes.  Pertinent labs & imaging results that were available during my care of the patient were reviewed by me and considered in my medical decision making (see chart for details).     Patient is well-appearing, afebrile, nontoxic, nontachycardic.  No indication for viral testing as patient has been symptomatic for over a week and this would not change management.  Given prolonged and worsening symptoms concern for secondary bacterial infection.  She was started on Augmentin.  Discussed that this is passed through breastmilk but  generally considered safe.  She is to monitor baby for rash, diarrhea, irritability.  Discussed that this can decrease the effectiveness of birth control and she should use backup birth control while on antibiotics.  She can use over-the-counter medications including Flonase, Mucinex, sinus rinses for additional symptom relief.  She is to rest and drink plenty of fluid.  If she has any persistent or worsening symptoms she needs to be reevaluated.  Final Clinical Impressions(s) / UC Diagnoses   Final diagnoses:  Sinobronchitis  Acute cough     Discharge Instructions      I am concerned that you have a sinus infection.  Please start Augmentin twice daily.  This will be passed through breastmilk so monitor baby for diarrhea, irritability, rash.  Use Mucinex.  Continue your Flonase.  Antibiotics can decrease the effectiveness of your birth control so please use backup birth control while on this medication.  If you have any worsening symptoms you need to be seen immediately.     ED Prescriptions     Medication Sig Dispense Auth. Provider   amoxicillin-clavulanate (AUGMENTIN) 875-125 MG tablet Take 1 tablet by mouth every 12 (twelve) hours. 14 tablet Nial Hawe, Noberto Retort, PA-C      PDMP not reviewed this encounter.   Jeani Hawking, PA-C 01/16/22 0915

## 2022-01-16 NOTE — ED Triage Notes (Signed)
Patient states that she took COVID test last Wednesday it was neg, had an evisit and told it was viral do OTC meds and nasal spray given during evisit. No better ear pain, head pressure, eyes puffy, cough worse at night. No fever.

## 2022-02-05 ENCOUNTER — Other Ambulatory Visit: Payer: Self-pay

## 2022-02-05 ENCOUNTER — Ambulatory Visit: Admit: 2022-02-05 | Payer: No Typology Code available for payment source

## 2022-02-05 ENCOUNTER — Ambulatory Visit
Admission: EM | Admit: 2022-02-05 | Discharge: 2022-02-05 | Disposition: A | Discharge status: Home / Self Care | Payer: No Typology Code available for payment source | Attending: Family Medicine | Admitting: Family Medicine

## 2022-02-05 ENCOUNTER — Ambulatory Visit (INDEPENDENT_AMBULATORY_CARE_PROVIDER_SITE_OTHER): Payer: No Typology Code available for payment source

## 2022-02-05 DIAGNOSIS — R0602 Shortness of breath: Secondary | ICD-10-CM

## 2022-02-05 DIAGNOSIS — R059 Cough, unspecified: Secondary | ICD-10-CM

## 2022-02-05 DIAGNOSIS — J029 Acute pharyngitis, unspecified: Secondary | ICD-10-CM | POA: Diagnosis not present

## 2022-02-05 DIAGNOSIS — J309 Allergic rhinitis, unspecified: Secondary | ICD-10-CM | POA: Diagnosis not present

## 2022-02-05 MED ORDER — FEXOFENADINE HCL 180 MG PO TABS: 180.0000 mg | ORAL_TABLET | Freq: Every day | ORAL | 0 refills | Status: DC

## 2022-02-05 MED ORDER — PREDNISONE 10 MG (21) PO TBPK: ORAL_TABLET | Freq: Every day | ORAL | 0 refills | Status: DC

## 2022-02-05 NOTE — ED Triage Notes (Signed)
Pt c/o of cough, sore throat and SOB onset for four weeks. Pt reported history of sinus infection and upper respiratory infection.

## 2022-02-05 NOTE — Discharge Instructions (Addendum)
Advised patient to discontinue Claritin. Advised/informed patient of chest x-ray results with hard copy provided.  Advised patient to take medication as directed with food to completion.  Advised patient to take Allegra with first dose of Sterapred Unipak for the next 5 of 10 days.  Advised may use Allegra as needed afterwards for concurrent postnasal drainage/drip.  Because you are breast-feeding advised to hold breastmilk for 4 hours after prednisone dose prior to pumping.  Encouraged patient increase daily water intake while taking this medication.  Advised patient if symptoms worsen please follow-up with PCP for further evaluation.

## 2022-02-05 NOTE — ED Provider Notes (Signed)
[facility]    CSN: [id number] Arrival date & time: 02/05/22  1003      History   Chief Complaint Chief Complaint  Patient presents with   Cough    HPI Stefanie White is a 31 y.o. female.   HPI 31 year old female presents with cough for 4 weeks.  PMH significant for morbid obesity, recurrent URI, and ADHD.  Patient was evaluated and treated for sinobronchitis with Augmentin on 01/16/2022.  Past Medical History:  Diagnosis Date   Alopecia    Recurrent upper respiratory infection (URI)    Seasonal allergies     Patient Active Problem List   Diagnosis Date Noted   [redacted] weeks gestation of pregnancy 02/24/2021   High triglycerides 08/26/2019   Stress reaction 07/25/2019   Sensation of chest tightness 07/25/2019   Hypertriglyceridemia without hypercholesterolemia 07/25/2019   Low HDL (under 40) 07/25/2019   Alopecia 07/25/2019   Tachycardia 06/09/2018   Sinus headache 06/09/2018   Physical deconditioning 06/09/2018   Dietary counseling 06/09/2018   Exercise counseling 06/09/2018   Sleeping difficulties 01/17/2018   Contact dermatitis and eczema-bilateral axilla 10/11/2017   Attention and concentration deficit 08/29/2017   Carbuncle and furuncle of leg 03/08/2017   Vitamin D insufficiency 05/22/2016   Obesity, Class I, BMI 30-34.9 05/22/2016   Low serum HDL 05/22/2016   Acute left otitis media 03/03/2016   Otitis, externa, infective 03/03/2016   Pharyngitis 03/03/2016   Fever, unspecified 03/03/2016   Migraine headache 03/01/2016   History of ADHD 03/01/2016   Environmental and seasonal allergies 03/01/2016   Viral URI 03/01/2016   h/o Alopecia areata 05/22/2014    Past Surgical History:  Procedure Laterality Date   CESAREAN SECTION  02/25/2021   Procedure: CESAREAN SECTION;  Surgeon: Charlett Nose, MD;  Location: [facility];  Service: Obstetrics;;   NO PAST SURGERIES      OB History     Gravida  1   Para  1   Term  1   Preterm       AB      Living  1      SAB      IAB      Ectopic      Multiple  0   Live Births  1            Home Medications    Prior to Admission medications   Medication Sig Start Date End Date Taking? Authorizing Provider  fexofenadine (ALLEGRA ALLERGY) 180 MG tablet Take 1 tablet (180 mg total) by mouth daily for 15 days. 02/05/22 02/20/22 Yes Trevor Iha, FNP  predniSONE (STERAPRED UNI-PAK 21 TAB) 10 MG (21) TBPK tablet Take by mouth daily. Take 6 tabs by mouth daily  for 2 days, then 5 tabs for 2 days, then 4 tabs for 2 days, then 3 tabs for 2 days, 2 tabs for 2 days, then 1 tab by mouth daily for 2 days 02/05/22  Yes Trevor Iha, FNP  fluticasone (FLONASE) 50 MCG/ACT nasal spray Place 2 sprays into both nostrils daily. 01/11/22   Waldon Merl, PA-C  loratadine (CLARITIN) 10 MG tablet Take 10 mg by mouth daily.    [provider]  norethindrone (MICRONOR) 0.35 MG tablet Take 1 tablet by mouth every day. 03/29/21     Prenatal Vit-Fe Fumarate-FA (MULTIVITAMIN-PRENATAL) 27-0.8 MG TABS tablet Take 1 tablet by mouth daily at 12 noon.    [provider]    Family History Family History  Problem Relation Age of Onset   Kidney Stones Mother    Diabetes Father 27   Hypertension Father 48   Depression Brother 10       bipolar   Cancer Maternal Aunt 4       colon   Heart attack Paternal Grandmother 57    Social History Social History   Tobacco Use   Smoking status: Former    Packs/day: 0.25    Years: 1.00    Total pack years: 0.25    Types: Cigarettes    Quit date: 06/12/2010    Years since quitting: 11.6   Smokeless tobacco: Never  Vaping Use   Vaping Use: Never used  Substance Use Topics   Alcohol use: Not Currently    Alcohol/week: 1.0 standard drink of alcohol    Types: 1 Glasses of wine per week    Comment: Socially   Drug use: No     Allergies   Rizatriptan   Review of Systems Review of Systems  Respiratory:  Positive for cough.    All other systems reviewed and are negative.    Physical Exam Triage Vital Signs ED Triage Vitals  Enc Vitals Group     BP      Pulse      Resp      Temp      Temp src      SpO2      Weight      Height      Head Circumference      Peak Flow      Pain Score      Pain Loc      Pain Edu?      Excl. in [place]?    No data found.  Updated Vital Signs BP 116/82 (BP Location: Left Arm)   Pulse 96   Temp 98.1 F (36.7 C) (Oral)   Resp 20   Ht 5\' 3"  (1.6 m)   Wt 206 lb (93.4 kg)   LMP 01/03/2022 (Approximate)   SpO2 96%   Breastfeeding Yes   BMI 36.49 kg/m   Physical Exam Vitals and nursing note reviewed.  Constitutional:      Appearance: She is normal weight.  HENT:     Head: Normocephalic and atraumatic.     Right Ear: Tympanic membrane, ear canal and external ear normal.     Left Ear: Tympanic membrane, ear canal and external ear normal.     Mouth/Throat:     Mouth: Mucous membranes are moist.     Pharynx: Oropharynx is clear.     Comments: Significant amount of clear drainage of posterior oropharynx noted Eyes:     Extraocular Movements: Extraocular movements intact.     Conjunctiva/sclera: Conjunctivae normal.     Pupils: Pupils are equal, round, and reactive to light.  Cardiovascular:     Rate and Rhythm: Normal rate and regular rhythm.     Pulses: Normal pulses.     Heart sounds: Normal heart sounds.  Pulmonary:     Effort: Pulmonary effort is normal.     Breath sounds: Normal breath sounds. No wheezing, rhonchi or rales.     Comments: Infrequent nonproductive cough noted on exam Musculoskeletal:        General: Normal range of motion.     Cervical back: Normal range of motion and neck supple.  Skin:    General: Skin is warm and dry.  Neurological:     General: No focal deficit present.  Mental Status: She is alert and oriented to person, place, and time.      UC Treatments / Results  Labs (all labs ordered are listed, but only abnormal  results are displayed) Labs Reviewed - No data to display  EKG   Radiology DG Chest 2 View  Result Date: 02/05/2022 CLINICAL DATA:  Pt c/o of cough, sore throat and SOB onset for four weeks. Pt reported history of sinus infection and upper respiratory infection. EXAM: CHEST - 2 VIEW COMPARISON:  11/17/2019 FINDINGS: Lungs are clear. Heart size and mediastinal contours are within normal limits. No effusion. Visualized bones unremarkable. IMPRESSION: No acute cardiopulmonary disease. Electronically Signed   By: Corlis Leak M.D.   On: 02/05/2022 10:54    Procedures Procedures (including critical care time)  Medications Ordered in UC Medications - No data to display  Initial Impression / Assessment and Plan / UC Course  I have reviewed the triage vital signs and the nursing notes.  Pertinent labs & imaging results that were available during my care of the patient were reviewed by me and considered in my medical decision making (see chart for details).     MDM: 1. Cough-CXR revealed above, Rx'd Sterapred Unipak; 2.  Allergic rhinitis-Rx'd Allegra. Advised patient to discontinue Claritin. Advised/informed patient of chest x-ray results with hard copy provided.  Advised patient to take medication as directed with food to completion.  Advised patient to take Allegra with first dose of Sterapred Unipak for the next 5 of 10 days.  Advised may use Allegra as needed afterwards for concurrent postnasal drainage/drip.  Because you are breast-feeding advised to hold breastmilk for 4 hours after prednisone dose prior to pumping.  Encouraged patient increase daily water intake while taking this medication.  Advised patient if symptoms worsen please follow-up with PCP for further evaluation.  Patient discharged home, hemodynamically stable. Final Clinical Impressions(s) / UC Diagnoses   Final diagnoses:  Cough, unspecified type  Allergic rhinitis, unspecified seasonality, unspecified trigger      Discharge Instructions      Advised patient to discontinue Claritin. Advised/informed patient of chest x-ray results with hard copy provided.  Advised patient to take medication as directed with food to completion.  Advised patient to take Allegra with first dose of Sterapred Unipak for the next 5 of 10 days.  Advised may use Allegra as needed afterwards for concurrent postnasal drainage/drip.  Because you are breast-feeding advised to hold breastmilk for 4 hours after prednisone dose prior to pumping.  Encouraged patient increase daily water intake while taking this medication.  Advised patient if symptoms worsen please follow-up with PCP for further evaluation.     ED Prescriptions     Medication Sig Dispense Auth. Provider   predniSONE (STERAPRED UNI-PAK 21 TAB) 10 MG (21) TBPK tablet Take by mouth daily. Take 6 tabs by mouth daily  for 2 days, then 5 tabs for 2 days, then 4 tabs for 2 days, then 3 tabs for 2 days, 2 tabs for 2 days, then 1 tab by mouth daily for 2 days 42 tablet Trevor Iha, FNP   fexofenadine [facility] ALLERGY) 180 MG tablet Take 1 tablet (180 mg total) by mouth daily for 15 days. 15 tablet Trevor Iha, FNP      PDMP not reviewed this encounter.   Trevor Iha, FNP 02/05/22 1142

## 2022-03-30 ENCOUNTER — Other Ambulatory Visit (HOSPITAL_COMMUNITY): Payer: Self-pay

## 2022-03-30 MED ORDER — NORETHIN ACE-ETH ESTRAD-FE 1-20 MG-MCG PO TABS
1.0000 | ORAL_TABLET | Freq: Every day | ORAL | 4 refills | Status: DC
  Filled 2022-03-30: qty 84, 84d supply, fill #0
  Filled 2022-06-29: qty 84, 84d supply, fill #1
  Filled 2022-09-13: qty 84, 84d supply, fill #2
  Filled 2022-11-28: qty 84, 84d supply, fill #3
  Filled 2023-02-26: qty 84, 84d supply, fill #4

## 2022-04-04 ENCOUNTER — Other Ambulatory Visit (HOSPITAL_COMMUNITY): Payer: Self-pay

## 2022-06-29 ENCOUNTER — Ambulatory Visit: Admit: 2022-06-29 | Discharge status: Absent without leave | Payer: 59

## 2022-06-29 ENCOUNTER — Ambulatory Visit
Admission: EM | Admit: 2022-06-29 | Discharge: 2022-06-29 | Disposition: A | Discharge status: Home / Self Care | Payer: 59 | Attending: Emergency Medicine | Admitting: Emergency Medicine

## 2022-06-29 DIAGNOSIS — J101 Influenza due to other identified influenza virus with other respiratory manifestations: Secondary | ICD-10-CM | POA: Diagnosis not present

## 2022-06-29 LAB — POCT INFLUENZA A/B
Influenza A, POC: NEGATIVE
Influenza B, POC: POSITIVE — AB

## 2022-06-29 LAB — POC SARS CORONAVIRUS 2 AG -  ED: SARS Coronavirus 2 Ag: NEGATIVE

## 2022-06-29 MED ORDER — OSELTAMIVIR PHOSPHATE 75 MG PO CAPS
75.0000 mg | ORAL_CAPSULE | Freq: Two times a day (BID) | ORAL | 0 refills | Status: AC
  Filled 2022-06-29: qty 10, 5d supply, fill #0

## 2022-06-29 NOTE — ED Triage Notes (Signed)
Pt presents to Urgent Care with c/o cough since yesterday and sore throat w/ laryngitis and nasal congestion today. Has not done COVID test.

## 2022-06-29 NOTE — ED Provider Notes (Signed)
[facility]    CSN: [id number] Arrival date & time: 06/29/22  1902    HISTORY   Chief Complaint  Patient presents with   Sore Throat   Cough   HPI Stefanie White is a pleasant, 32 y.o. female who presents to urgent care today. Patient c/o cough since yesterday and sore throat w/ laryngitis and nasal congestion today.  Patient states she has not tried anything to alleviate her symptoms.  Patient has a history of allergies, currently taking any allergy medications.  Patient has normal vital signs on arrival today.  Patient denies fever, body aches, chills, nausea, vomiting, diarrhea, known sick contacts.  The history is provided by the patient.   Past Medical History:  Diagnosis Date   Alopecia    Recurrent upper respiratory infection (URI)    Seasonal allergies    Patient Active Problem List   Diagnosis Date Noted   [redacted] weeks gestation of pregnancy 02/24/2021   High triglycerides 08/26/2019   Stress reaction 07/25/2019   Sensation of chest tightness 07/25/2019   Hypertriglyceridemia without hypercholesterolemia 07/25/2019   Low HDL (under 40) 07/25/2019   Alopecia 07/25/2019   Tachycardia 06/09/2018   Sinus headache 06/09/2018   Physical deconditioning 06/09/2018   Dietary counseling 06/09/2018   Exercise counseling 06/09/2018   Sleeping difficulties 01/17/2018   Contact dermatitis and eczema-bilateral axilla 10/11/2017   Attention and concentration deficit 08/29/2017   Carbuncle and furuncle of leg 03/08/2017   Vitamin D insufficiency 05/22/2016   Obesity, Class I, BMI 30-34.9 05/22/2016   Low serum HDL 05/22/2016   Acute left otitis media 03/03/2016   Otitis, externa, infective 03/03/2016   Pharyngitis 03/03/2016   Fever, unspecified 03/03/2016   Migraine headache 03/01/2016   History of ADHD 03/01/2016   Environmental and seasonal allergies 03/01/2016   Viral URI 03/01/2016   h/o Alopecia areata 05/22/2014   Past Surgical History:  Procedure  Laterality Date   CESAREAN SECTION  02/25/2021   Procedure: CESAREAN SECTION;  Surgeon: Rowland Lathe, MD;  Location: [facility];  Service: Obstetrics;;   NO PAST SURGERIES     OB History     Gravida  1   Para  1   Term  1   Preterm      AB      Living  1      SAB      IAB      Ectopic      Multiple  0   Live Births  1          Home Medications    Prior to Admission medications   Medication Sig Start Date End Date Taking? Authorizing Provider  fexofenadine (ALLEGRA ALLERGY) 180 MG tablet Take 1 tablet (180 mg total) by mouth daily for 15 days. 02/05/22 06/29/22 Yes Eliezer Lofts, FNP  fluticasone (FLONASE) 50 MCG/ACT nasal spray Place 2 sprays into both nostrils daily. 01/11/22   Brunetta Jeans, PA-C  loratadine (CLARITIN) 10 MG tablet Take 10 mg by mouth daily.    [provider]  norethindrone (MICRONOR) 0.35 MG tablet Take 1 tablet by mouth every day. 03/29/21     norethindrone-ethinyl estradiol-FE (JUNEL FE 1/20) 1-20 MG-MCG tablet Take 1 tablet by mouth daily. 03/30/22     predniSONE (STERAPRED UNI-PAK 21 TAB) 10 MG (21) TBPK tablet Take by mouth daily. Take 6 tabs by mouth daily  for 2 days, then 5 tabs for 2 days, then 4 tabs for 2 days, then  3 tabs for 2 days, 2 tabs for 2 days, then 1 tab by mouth daily for 2 days 02/05/22   Eliezer Lofts, FNP  Prenatal Vit-Fe Fumarate-FA (MULTIVITAMIN-PRENATAL) 27-0.8 MG TABS tablet Take 1 tablet by mouth daily at 12 noon.    [provider]    Family History Family History  Problem Relation Age of Onset   Kidney Stones Mother    Diabetes Father 32   Hypertension Father 31   Depression Brother 53       bipolar   Cancer Maternal Aunt 36       colon   Heart attack Paternal Grandmother 69   Social History Social History   Tobacco Use   Smoking status: Former    Packs/day: 0.25    Years: 1.00    Total pack years: 0.25    Types: Cigarettes    Quit date: 06/12/2010    Years since quitting:  12.0   Smokeless tobacco: Never  Vaping Use   Vaping Use: Never used  Substance Use Topics   Alcohol use: Yes    Alcohol/week: 1.0 standard drink of alcohol    Types: 1 Glasses of wine per week    Comment: Socially   Drug use: No   Allergies   Rizatriptan  Review of Systems Review of Systems Pertinent findings revealed after performing a 14 point review of systems has been noted in the history of present illness.  Physical Exam Triage Vital Signs ED Triage Vitals  Enc Vitals Group     BP 04/08/21 0827 (!) 147/82     Pulse Rate 04/08/21 0827 72     Resp 04/08/21 0827 18     Temp 04/08/21 0827 98.3 F (36.8 C)     Temp Source 04/08/21 0827 Oral     SpO2 04/08/21 0827 98 %     Weight --      Height --      Head Circumference --      Peak Flow --      Pain Score 04/08/21 0826 5     Pain Loc --      Pain Edu? --      Excl. in [place]? --   No data found.  Updated Vital Signs BP 125/85 (BP Location: Right Arm)   Pulse 97   Temp 98.2 F (36.8 C) (Oral)   Resp 20   Ht 5\' 3"  (1.6 m)   Wt 210 lb (95.3 kg)   LMP 06/25/2022   SpO2 97%   BMI 37.20 kg/m   Physical Exam  Visual Acuity Right Eye Distance:   Left Eye Distance:   Bilateral Distance:    Right Eye Near:   Left Eye Near:    Bilateral Near:     UC Couse / Diagnostics / Procedures:     Radiology No results found.  Procedures Procedures (including critical care time) EKG  Pending results:  Labs Reviewed  POCT INFLUENZA A/B - Abnormal; Notable for the following components:      Result Value   Influenza B, POC Positive (*)    All other components within normal limits  POC SARS CORONAVIRUS 2 AG -  ED    Medications Ordered in UC: Medications - No data to display  UC Diagnoses / Final Clinical Impressions(s)   I have reviewed the triage vital signs and the nursing notes.  Pertinent labs & imaging results that were available during my care of the patient were reviewed by me and considered in  my  medical decision making (see chart for details).    Final diagnoses:  Influenza B   Patient advised of positive influenza test.  Patient provided with Tamiflu as she is within 48 hours of onset of symptoms.  Conservative care recommended.  Supportive medications discussed.  Return precautions advised.  Please see discharge instructions below for further details of plan of care as provided to patient. ED Prescriptions     Medication Sig Dispense Auth. Provider   oseltamivir (TAMIFLU) 75 MG capsule Take 1 capsule (75 mg total) by mouth every 12 (twelve) hours for 5 days. 10 capsule Lynden Oxford Scales, PA-C      PDMP not reviewed this encounter.  Disposition Upon Discharge:  Condition: stable for discharge home Home: take medications as prescribed; routine discharge instructions as discussed; follow up as advised.  Patient presented with an acute illness with associated systemic symptoms and significant discomfort requiring urgent management. In my opinion, this is a condition that a prudent lay person (someone who possesses an average knowledge of health and medicine) may potentially expect to result in complications if not addressed urgently such as respiratory distress, impairment of bodily function or dysfunction of bodily organs.   Routine symptom specific, illness specific and/or disease specific instructions were discussed with the patient and/or caregiver at length.   As such, the patient has been evaluated and assessed, work-up was performed and treatment was provided in alignment with urgent care protocols and evidence based medicine.  Patient/parent/caregiver has been advised that the patient may require follow up for further testing and treatment if the symptoms continue in spite of treatment, as clinically indicated and appropriate.  If the patient was tested for COVID-19, Influenza and/or RSV, then the patient/parent/guardian was advised to isolate at home pending the  results of his/her diagnostic coronavirus test and potentially longer if they're positive. I have also advised pt that if his/her COVID-19 test returns positive, it's recommended to self-isolate for at least 10 days after symptoms first appeared AND until fever-free for 24 hours without fever reducer AND other symptoms have improved or resolved. Discussed self-isolation recommendations as well as instructions for household member/close contacts as per the [facility] and [place] DHHS, and also gave patient the [facility] packet with this information.  Patient/parent/caregiver has been advised to return to the [facility] or PCP in 3-5 days if no better; to PCP or the Emergency Department if new signs and symptoms develop, or if the current signs or symptoms continue to change or worsen for further workup, evaluation and treatment as clinically indicated and appropriate  The patient will follow up with their current PCP if and as advised. If the patient does not currently have a PCP we will assist them in obtaining one.   The patient may need specialty follow up if the symptoms continue, in spite of conservative treatment and management, for further workup, evaluation, consultation and treatment as clinically indicated and appropriate.  Patient/parent/caregiver verbalized understanding and agreement of plan as discussed.  All questions were addressed during visit.  Please see discharge instructions below for further details of plan.  Discharge Instructions:   Discharge Instructions      I have enclosed information about influenza infection and self-care at home.  I sent a prescription for Tamiflu to your pharmacy, please take 1 capsule every 12 hours for the next 5 days.  You are welcome to continue taking your allergy medications as have been prescribed for you.  I also recommend that you continue ibuprofen 400  mg alternating with Tylenol 1000 mg for relief of fever and bodyaches.  For cough, you are welcome to take  guaifenesin 400 mg which can be purchased at the pharmacy, brand names include Robitussin and Mucinex.  Conservative care is recommended with rest, drinking plenty of clear fluids and eating simple, small meals only when hungry.  If you are not feeling better in the next 3 to 5 days or if you begin to feel worse despite these recommendations, please return for repeat evaluation.  Thank you for visiting urgent care today.      This office note has been dictated using Teaching laboratory technician.  Unfortunately, this method of dictation can sometimes lead to typographical or grammatical errors.  I apologize for your inconvenience in advance if this occurs.  Please do not hesitate to reach out to me if clarification is needed.      Theadora Rama Scales, PA-C 06/29/22 1949

## 2022-06-29 NOTE — Discharge Instructions (Signed)
I have enclosed information about influenza infection and self-care at home.  I sent a prescription for Tamiflu to your pharmacy, please take 1 capsule every 12 hours for the next 5 days.  You are welcome to continue taking your allergy medications as have been prescribed for you.  I also recommend that you continue ibuprofen 400 mg alternating with Tylenol 1000 mg for relief of fever and bodyaches.  For cough, you are welcome to take guaifenesin 400 mg which can be purchased at the pharmacy, brand names include Robitussin and Mucinex.  Conservative care is recommended with rest, drinking plenty of clear fluids and eating simple, small meals only when hungry.  If you are not feeling better in the next 3 to 5 days or if you begin to feel worse despite these recommendations, please return for repeat evaluation.  Thank you for visiting urgent care today.

## 2022-06-30 ENCOUNTER — Telehealth: Payer: Self-pay | Admitting: Emergency Medicine

## 2022-06-30 ENCOUNTER — Other Ambulatory Visit (HOSPITAL_COMMUNITY): Payer: Self-pay

## 2022-06-30 NOTE — Telephone Encounter (Signed)
Spoke with patient states that she is doing well and will follow up as needed.

## 2022-08-14 ENCOUNTER — Other Ambulatory Visit (HOSPITAL_COMMUNITY): Payer: Self-pay

## 2022-08-14 ENCOUNTER — Encounter: Payer: Self-pay | Admitting: Family Medicine

## 2022-08-14 ENCOUNTER — Ambulatory Visit (INDEPENDENT_AMBULATORY_CARE_PROVIDER_SITE_OTHER): Payer: 59 | Admitting: Family Medicine

## 2022-08-14 VITALS — BP 117/82 | HR 89 | Temp 97.4°F | Resp 18 | Ht 63.0 in | Wt 219.3 lb

## 2022-08-14 DIAGNOSIS — K219 Gastro-esophageal reflux disease without esophagitis: Secondary | ICD-10-CM | POA: Diagnosis not present

## 2022-08-14 DIAGNOSIS — R0681 Apnea, not elsewhere classified: Secondary | ICD-10-CM

## 2022-08-14 DIAGNOSIS — J351 Hypertrophy of tonsils: Secondary | ICD-10-CM | POA: Diagnosis not present

## 2022-08-14 DIAGNOSIS — J342 Deviated nasal septum: Secondary | ICD-10-CM

## 2022-08-14 DIAGNOSIS — R0683 Snoring: Secondary | ICD-10-CM | POA: Diagnosis not present

## 2022-08-14 DIAGNOSIS — Z7689 Persons encountering health services in other specified circumstances: Secondary | ICD-10-CM

## 2022-08-14 MED ORDER — PANTOPRAZOLE SODIUM 40 MG PO TBEC
40.0000 mg | DELAYED_RELEASE_TABLET | Freq: Every day | ORAL | 1 refills | Status: DC
  Filled 2022-08-14: qty 30, 30d supply, fill #0
  Filled 2022-09-13: qty 30, 30d supply, fill #1

## 2022-08-14 NOTE — Progress Notes (Signed)
New Patient Office Visit  Subjective    Patient ID: Stefanie White, female    DOB: December 22, 1990  Age: 32 y.o. MRN: [id number]  CC:  Chief Complaint  Patient presents with   Establish Care    Sleep issues, irregular menses    HPI Stefanie White presents to establish care. Pt is new to me.  She has been seeing her OB/GYN since the birth of her daughter 31 months. Previous lab results and documents reviewed today and abstracted flu vaccine, pap smear, etc.  Sleep disturbances Pt reports she's been having some sleep issues. She reports she wakes up in the middle of the night. Her husband has reported to her that she stops breathing in the night. She reports she wakes up fatigue and can fall asleep if she is still. This has been present for the last 6 months but worse in the last 3 months. She reports a hx of a deviated septum but never got repaired as a teen.   GERD She reports bad indigestion and reflux with certain foods such as red sauce or foods high in acid. She reports regurgitation of food at times that has occurred after her pregnancy. She reports GERD is the first time she's dealt with 'heartburn'. She had to eat tums during pregnancy. She does report a feel movement in her stomach when she eats.   She uses excedrin migraine Otc for migraines. She is unable to use triptans.  Pt reports lack of sleep triggers her migraines along with stress.   Pt reports irregular menses. She will bleed only a day or 2 since the birth of her daughter.  She did breastfeed for the first year but hasn't been doing this in the last 6 months. She also started an OCP back in October 2023.   Outpatient Encounter Medications as of 08/14/2022  Medication Sig   fexofenadine (ALLEGRA ALLERGY) 180 MG tablet Take 1 tablet (180 mg total) by mouth daily for 15 days.   fluticasone (FLONASE) 50 MCG/ACT nasal spray Place 2 sprays into both nostrils daily.   norethindrone-ethinyl estradiol-FE (JUNEL FE 1/20) 1-20  MG-MCG tablet Take 1 tablet by mouth daily.   [DISCONTINUED] norethindrone (MICRONOR) 0.35 MG tablet Take 1 tablet by mouth every day.   [DISCONTINUED] SUMAtriptan (IMITREX) 50 MG tablet Take 50 mg by mouth every 2 (two) hours as needed.   No facility-administered encounter medications on file as of 08/14/2022.    Past Medical History:  Diagnosis Date   Alopecia    Recurrent upper respiratory infection (URI)    Seasonal allergies     Past Surgical History:  Procedure Laterality Date   CESAREAN SECTION  02/25/2021   Procedure: CESAREAN SECTION;  Surgeon: Rowland Lathe, MD;  Location: [facility];  Service: Obstetrics;;   NO PAST SURGERIES      Family History  Problem Relation Age of Onset   Kidney Stones Mother    Diabetes Father 34   Hypertension Father 63   Depression Brother 76       bipolar   Cancer Maternal Aunt 82       colon   Heart attack Paternal Grandmother 49    Social History   Socioeconomic History   Marital status: Married    Spouse name: Not on file   Number of children: Not on file   Years of education: Not on file   Highest education level: Not on file  Occupational History   Not on file  Tobacco Use   Smoking status: Former    Packs/day: 0.25    Years: 1.00    Total pack years: 0.25    Types: Cigarettes    Quit date: 06/12/2010    Years since quitting: 12.1    Passive exposure: Never   Smokeless tobacco: Never  Vaping Use   Vaping Use: Never used  Substance and Sexual Activity   Alcohol use: Yes    Alcohol/week: 1.0 standard drink of alcohol    Types: 1 Glasses of wine per week    Comment: occassionally   Drug use: No   Sexual activity: Yes    Partners: Male    Birth control/protection: Pill  Other Topics Concern   Not on file  Social History Narrative   Not on file   Social Determinants of Health   Financial Resource Strain: Not on file  Food Insecurity: Not on file  Transportation Needs: Not on file  Physical Activity: Not  on file  Stress: Not on file  Social Connections: Not on file  Intimate Partner Violence: Not on file    Review of Systems  Gastrointestinal:  Positive for heartburn and vomiting. Negative for abdominal pain, blood in stool, constipation, diarrhea, melena and nausea.  Endo/Heme/Allergies:        Irregular menses  Psychiatric/Behavioral:         Sleep disturbances with snoring and apneic spells  All other systems reviewed and are negative.      Objective    BP 117/82   Pulse 89   Temp (!) 97.4 F (36.3 C) (Oral)   Resp 18   Ht '5\' 3"'$  (1.6 m)   Wt 219 lb 4.8 oz (99.5 kg)   LMP 07/24/2022 (Approximate) Comment: Patient states that after giving birth in September of 2022 her periods have been irregular  SpO2 97%   Breastfeeding No   BMI 38.85 kg/m   Physical Exam Vitals and nursing note reviewed.  Constitutional:      Appearance: Normal appearance. She is obese.  HENT:     Head: Normocephalic and atraumatic.     Right Ear: External ear normal.     Left Ear: External ear normal.     Nose: Nose normal.     Mouth/Throat:     Comments: Bilateral tonsillar hypertrophy +4 Eyes:     Pupils: Pupils are equal, round, and reactive to light.  Cardiovascular:     Rate and Rhythm: Normal rate and regular rhythm.     Pulses: Normal pulses.     Heart sounds: Normal heart sounds.  Pulmonary:     Effort: Pulmonary effort is normal.     Breath sounds: Normal breath sounds.  Abdominal:     General: Abdomen is flat. Bowel sounds are normal.  Skin:    General: Skin is warm.     Capillary Refill: Capillary refill takes less than 2 seconds.  Neurological:     General: No focal deficit present.     Mental Status: She is alert and oriented to person, place, and time. Mental status is at baseline.  Psychiatric:        Mood and Affect: Mood normal.        Behavior: Behavior normal.        Thought Content: Thought content normal.        Judgment: Judgment normal.       Assessment  & Plan:   Problem List Items Addressed This Visit   None Encounter to establish care with  new doctor  Gastroesophageal reflux disease without esophagitis -     Pantoprazole Sodium; Take 1 tablet (40 mg total) by mouth daily.  Dispense: 30 tablet; Refill: 1   -main complaint today is GERD uncontrolled. She will be started on Protonix '20mg'$  30 min-1 hour before her first meal for the next 4 weeks. See back in 4 weeks for annual and fasting labs. If no better, may refer for barium swallow to rule out hiatal hernia. Also discussed food choices and lifestyle changes with pt today including cutting back on foods high in acid, red sauce, spicy foods, along with caffeine.   Snoring -     Ambulatory referral to ENT  Witnessed apneic spells -     Ambulatory referral to ENT  Deviated septum -     Ambulatory referral to ENT  Chronic tonsillar hypertrophy -     Ambulatory referral to ENT   -pt's hx of snoring and apneic spells, worsening with birth of her child. She does report a deviated septum that she's seen ENT in her teenage years but never followed back up for management. She also has significant +4 hypertrophy of tonsils bilaterally also likely contributing to sleep apnea symptoms. Suspect this should be evaluated by surgeon as sleep study and CPAP likely wouldn't help her case since structural defects are likely causing her sleep apnea. Pt is in agreement with ENT consultation.  No follow-ups on file.   Leeanne Rio, MD

## 2022-08-15 ENCOUNTER — Telehealth: Payer: Self-pay | Admitting: Family Medicine

## 2022-08-15 NOTE — Telephone Encounter (Signed)
Patient called and states that ENT referral was accepted but that the office is not accepting any new patients. Patient requested referral elsewhere. Stefanie White

## 2022-08-17 NOTE — Telephone Encounter (Signed)
Referral, clinical notes and copies of insurance cards have been faxed to [facility] at [phone]. Office will contact patient to schedule referral appointment. Message sent to patient through mychart with office contact information.

## 2022-09-08 ENCOUNTER — Encounter: Payer: 59 | Admitting: Family Medicine

## 2022-09-13 ENCOUNTER — Other Ambulatory Visit: Payer: Self-pay

## 2022-09-25 ENCOUNTER — Encounter: Payer: Self-pay | Admitting: *Deleted

## 2022-09-26 ENCOUNTER — Ambulatory Visit (INDEPENDENT_AMBULATORY_CARE_PROVIDER_SITE_OTHER): Payer: 59 | Admitting: Family Medicine

## 2022-09-26 ENCOUNTER — Encounter: Payer: Self-pay | Admitting: Family Medicine

## 2022-09-26 VITALS — BP 124/85 | HR 97 | Temp 97.5°F | Resp 18 | Ht 63.0 in | Wt 223.6 lb

## 2022-09-26 DIAGNOSIS — Z13220 Encounter for screening for lipoid disorders: Secondary | ICD-10-CM | POA: Diagnosis not present

## 2022-09-26 DIAGNOSIS — Z0000 Encounter for general adult medical examination without abnormal findings: Secondary | ICD-10-CM

## 2022-09-26 DIAGNOSIS — Z136 Encounter for screening for cardiovascular disorders: Secondary | ICD-10-CM | POA: Diagnosis not present

## 2022-09-26 DIAGNOSIS — R7302 Impaired glucose tolerance (oral): Secondary | ICD-10-CM

## 2022-09-26 DIAGNOSIS — K219 Gastro-esophageal reflux disease without esophagitis: Secondary | ICD-10-CM | POA: Diagnosis not present

## 2022-09-26 DIAGNOSIS — R5382 Chronic fatigue, unspecified: Secondary | ICD-10-CM

## 2022-09-26 DIAGNOSIS — G479 Sleep disorder, unspecified: Secondary | ICD-10-CM

## 2022-09-26 NOTE — Progress Notes (Signed)
Complete physical exam  Patient: Stefanie White   DOB: Jul 30, 1990   31 y.o. Female  MRN: [id number]  Subjective:    Chief Complaint  Patient presents with   Annual Exam    Patient states that she doesn't have any concerns at this time she states that the medication that she started(  Protonix's) , has been working great she has not had any symptoms of heartburn or reflux    Stefanie White is a 32 y.o. female who presents today for a complete physical exam. She reports consuming a  low fat/low carb  diet. The patient does not participate in regular exercise at present. Walking a lot at work  She generally feels well. She reports sleeping poorly. She does have additional problems to discuss today.  She has been trying to cut back on fast foods.  Most recent fall risk assessment:    09/26/2022    8:48 AM  Fall Risk   Falls in the past year? 0  Number falls in past yr: 0  Injury with Fall? 0  Risk for fall due to : No Fall Risks  Follow up Falls evaluation completed     Most recent depression screenings:    09/26/2022    8:48 AM 08/14/2022    1:24 PM  PHQ 2/9 Scores  PHQ - 2 Score 2 1  PHQ- 9 Score 7 7    Vision:Within last year     Patient Care Team: Suzan Slick, MD as PCP - General (Family Medicine) Waynard Reeds, MD as Consulting Physician (Obstetrics and Gynecology) Glenford Peers, OD as Referring Physician (Optometry)   Outpatient Medications Prior to Visit  Medication Sig   cetirizine (ZYRTEC) 10 MG chewable tablet Chew 10 mg by mouth daily.   fluticasone (FLONASE) 50 MCG/ACT nasal spray Place 2 sprays into both nostrils daily.   norethindrone-ethinyl estradiol-FE (JUNEL FE 1/20) 1-20 MG-MCG tablet Take 1 tablet by mouth daily.   pantoprazole (PROTONIX) 40 MG tablet Take 1 tablet (40 mg total) by mouth daily.   fexofenadine (ALLEGRA ALLERGY) 180 MG tablet Take 1 tablet (180 mg total) by mouth daily for 15 days.   No facility-administered medications prior  to visit.    Review of Systems  Constitutional:  Positive for malaise/fatigue.  Psychiatric/Behavioral:         Sleep disturbances due to snoring  All other systems reviewed and are negative.         Objective:     BP 124/85   Pulse 97   Temp (!) 97.5 F (36.4 C) (Oral)   Resp 18   Ht  (1.6 m)   Wt 223 lb 9.6 oz (101.4 kg)   LMP 09/22/2022 (Approximate)   SpO2 97%   BMI 39.61 kg/m  BP Readings from Last 3 Encounters:  09/26/22 124/85  08/14/22 117/82  06/29/22 125/85      Physical Exam Vitals and nursing note reviewed.  Constitutional:      Appearance: Normal appearance. She is obese.  HENT:     Head: Normocephalic and atraumatic.     Right Ear: Tympanic membrane, ear canal and external ear normal.     Left Ear: Tympanic membrane, ear canal and external ear normal.     Nose: Nose normal.     Mouth/Throat:     Mouth: Mucous membranes are moist.     Pharynx: Oropharynx is clear.  Eyes:     Extraocular Movements: Extraocular movements intact.  Conjunctiva/sclera: Conjunctivae normal.     Pupils: Pupils are equal, round, and reactive to light.  Cardiovascular:     Rate and Rhythm: Normal rate and regular rhythm.     Pulses: Normal pulses.     Heart sounds: Normal heart sounds.  Pulmonary:     Effort: Pulmonary effort is normal.     Breath sounds: Normal breath sounds.  Abdominal:     General: Abdomen is flat. Bowel sounds are normal.  Skin:    General: Skin is warm.     Capillary Refill: Capillary refill takes less than 2 seconds.  Neurological:     General: No focal deficit present.     Mental Status: She is alert and oriented to person, place, and time. Mental status is at baseline.  Psychiatric:        Mood and Affect: Mood normal.        Behavior: Behavior normal.        Thought Content: Thought content normal.        Judgment: Judgment normal.      No results found for any visits on 09/26/22.      Assessment & Plan:    Routine  Health Maintenance and Physical Exam  Immunization History  Administered Date(s) Administered   Influenza,inj,Quad PF,6+ Mos 03/13/2022   Influenza-Unspecified 02/11/2015, 03/12/2017, 02/13/2018, 03/12/2018, 03/06/2019, 02/11/2020, 04/04/2021   MMR 02/27/2021   Tdap 01/17/2018, 12/22/2020   Unspecified SARS-COV-2 Vaccination 06/02/2019, 06/24/2019    Health Maintenance  Topic Date Due   COVID-19 Vaccine (3 - 2023-24 season) 11/14/2022 (Originally 02/10/2022)   PAP SMEAR-Modifier  12/21/2022   INFLUENZA VACCINE  01/11/2023   DTaP/Tdap/Td (3 - Td or Tdap) 12/23/2030   Hepatitis C Screening  Completed   HIV Screening  Completed   HPV VACCINES  Aged Out    Discussed health benefits of physical activity, and encouraged her to engage in regular exercise appropriate for her age and condition.  Problem List Items Addressed This Visit       Other   Sleeping difficulties   Other Visit Diagnoses     Annual physical exam    -  Primary   Encounter for lipid screening for cardiovascular disease       Relevant Orders   Lipid panel   Impaired glucose tolerance       Relevant Orders   CBC with Differential/Platelet   Comprehensive metabolic panel   Hemoglobin A1c   Chronic fatigue       Relevant Orders   TSH   T4, free   VITAMIN D 25 Hydroxy (Vit-D Deficiency, Fractures)   Gastroesophageal reflux disease without esophagitis          Return in about 1 year (around 09/26/2023) for Annual Physical. Annual physical exam  Encounter for lipid screening for cardiovascular disease -     Lipid panel  Impaired glucose tolerance -     CBC with Differential/Platelet -     Comprehensive metabolic panel -     Hemoglobin A1c  Chronic fatigue -     TSH -     T4, free -     VITAMIN D 25 Hydroxy (Vit-D Deficiency, Fractures)  Sleeping difficulties  Gastroesophageal reflux disease without esophagitis  Screening labs including thyroid and vitamin D due to fatigue. Could also be from  sleeping disturbances of which she has an appt with ENT scheduled for 4/30 to discuss chronic sinus issues and deviated septum. GERD symptoms improved with PPI. Can continue the Protonix  sparingly and watching diet for now.  See back in 1 year sooner prn.      Suzan Slick, MD

## 2022-09-27 LAB — COMPREHENSIVE METABOLIC PANEL
ALT: 34 IU/L — ABNORMAL HIGH (ref 0–32)
AST: 20 IU/L (ref 0–40)
Albumin/Globulin Ratio: 1.5 (ref 1.2–2.2)
Albumin: 4.2 g/dL (ref 4.0–5.0)
Alkaline Phosphatase: 101 IU/L (ref 44–121)
BUN/Creatinine Ratio: 19 (ref 9–23)
BUN: 11 mg/dL (ref 6–20)
Bilirubin Total: 0.4 mg/dL (ref 0.0–1.2)
CO2: 21 mmol/L (ref 20–29)
Calcium: 9.1 mg/dL (ref 8.7–10.2)
Chloride: 103 mmol/L (ref 96–106)
Creatinine, Ser: 0.58 mg/dL (ref 0.57–1.00)
Globulin, Total: 2.8 g/dL (ref 1.5–4.5)
Glucose: 86 mg/dL (ref 70–99)
Potassium: 4.2 mmol/L (ref 3.5–5.2)
Sodium: 137 mmol/L (ref 134–144)
Total Protein: 7 g/dL (ref 6.0–8.5)
eGFR: 125 mL/min/{1.73_m2} (ref >59)

## 2022-09-27 LAB — CBC WITH DIFFERENTIAL/PLATELET
Basophils Absolute: 0.1 10*3/uL (ref 0.0–0.2)
Basos: 1 %
EOS (ABSOLUTE): 0.5 10*3/uL — ABNORMAL HIGH (ref 0.0–0.4)
Eos: 6 %
Hematocrit: 39.5 % (ref 34.0–46.6)
Hemoglobin: 13.6 g/dL (ref 11.1–15.9)
Immature Grans (Abs): 0 10*3/uL (ref 0.0–0.1)
Immature Granulocytes: 0 %
Lymphocytes Absolute: 2.3 10*3/uL (ref 0.7–3.1)
Lymphs: 25 %
MCH: 29.4 pg (ref 26.6–33.0)
MCHC: 34.4 g/dL (ref 31.5–35.7)
MCV: 85 fL (ref 79–97)
Monocytes Absolute: 0.7 10*3/uL (ref 0.1–0.9)
Monocytes: 8 %
Neutrophils Absolute: 5.8 10*3/uL (ref 1.4–7.0)
Neutrophils: 60 %
Platelets: 352 10*3/uL (ref 150–450)
RBC: 4.63 x10E6/uL (ref 3.77–5.28)
RDW: 12.2 % (ref 11.7–15.4)
WBC: 9.5 10*3/uL (ref 3.4–10.8)

## 2022-09-27 LAB — LIPID PANEL
Chol/HDL Ratio: 3.4 ratio (ref 0.0–4.4)
Cholesterol, Total: 135 mg/dL (ref 100–199)
HDL: 40 mg/dL (ref >39)
LDL Chol Calc (NIH): 73 mg/dL (ref 0–99)
Triglycerides: 123 mg/dL (ref 0–149)
VLDL Cholesterol Cal: 22 mg/dL (ref 5–40)

## 2022-09-27 LAB — TSH: TSH: 2.68 u[IU]/mL (ref 0.450–4.500)

## 2022-09-27 LAB — T4, FREE: Free T4: 1 ng/dL (ref 0.82–1.77)

## 2022-09-27 LAB — HEMOGLOBIN A1C
Est. average glucose Bld gHb Est-mCnc: 120 mg/dL
Hgb A1c MFr Bld: 5.8 % — ABNORMAL HIGH (ref 4.8–5.6)

## 2022-09-27 LAB — VITAMIN D 25 HYDROXY (VIT D DEFICIENCY, FRACTURES): Vit D, 25-Hydroxy: 29.8 ng/mL — ABNORMAL LOW (ref 30.0–100.0)

## 2022-10-10 DIAGNOSIS — R0681 Apnea, not elsewhere classified: Secondary | ICD-10-CM | POA: Diagnosis not present

## 2022-10-10 DIAGNOSIS — J351 Hypertrophy of tonsils: Secondary | ICD-10-CM | POA: Diagnosis not present

## 2022-10-10 DIAGNOSIS — R0683 Snoring: Secondary | ICD-10-CM | POA: Diagnosis not present

## 2022-10-10 DIAGNOSIS — J342 Deviated nasal septum: Secondary | ICD-10-CM | POA: Diagnosis not present

## 2022-10-10 DIAGNOSIS — Z6835 Body mass index (BMI) 35.0-35.9, adult: Secondary | ICD-10-CM | POA: Diagnosis not present

## 2022-10-10 DIAGNOSIS — J309 Allergic rhinitis, unspecified: Secondary | ICD-10-CM | POA: Diagnosis not present

## 2022-10-31 ENCOUNTER — Other Ambulatory Visit: Payer: Self-pay | Admitting: Family Medicine

## 2022-10-31 DIAGNOSIS — K219 Gastro-esophageal reflux disease without esophagitis: Secondary | ICD-10-CM

## 2022-11-02 ENCOUNTER — Other Ambulatory Visit (HOSPITAL_COMMUNITY): Payer: Self-pay

## 2022-11-02 MED ORDER — PANTOPRAZOLE SODIUM 40 MG PO TBEC
40.0000 mg | DELAYED_RELEASE_TABLET | Freq: Every day | ORAL | 1 refills | Status: DC
  Filled 2022-11-02: qty 30, 30d supply, fill #0
  Filled 2022-12-22: qty 30, 30d supply, fill #1

## 2022-12-18 DIAGNOSIS — G4733 Obstructive sleep apnea (adult) (pediatric): Secondary | ICD-10-CM | POA: Diagnosis not present

## 2022-12-18 NOTE — Progress Notes (Unsigned)
New Patient Note  RE: Stefanie White MRN: [id number] DOB: 06-22-1990 Date of Office Visit: 12/19/2022  Consult requested by: Laren Boom, DO Primary care provider: Suzan Slick, MD  Chief Complaint: No chief complaint on file.  History of Present Illness: I had the pleasure of seeing Stefanie White [facility]/20[facility]. She is a 32 y.o. female, who is referred here by Suzan Slick, MD for the evaluation of allergic rhinitis.  She reports symptoms of ***. Symptoms have been going on for *** years. The symptoms are present *** all year around with worsening in ***. Other triggers include exposure to ***. Anosmia: ***. Headache: ***. She has used *** with ***fair improvement in symptoms. Sinus infections: ***. Previous work up includes: ***. Previous ENT evaluation: ***. Previous sinus imaging: ***. History of nasal polyps: ***. Last eye exam: ***. History of reflux: ***.  10/10/2022 ENT visit: "Impression & Plans:  Stefanie White is a 32 y.o. female with history significant for excessive daytime sleepiness, daytime headaches, snoring and witnessed apneic episodes concerning for obstructive sleep apnea. Patient has not had a formal sleep study. We discussed that obstructive sleep apnea is typically multifactorial, and that based on the severity of her OSA, surgical intervention with tonsillectomy may or may not take away need for CPAP therapy. I counseled patient that based on the minimal deflection of her nasal septum, I would not consider septoplasty at this time. We had a comprehensive discussion regarding tonsillectomy, including risks of surgery as well as recovery. Patient would like to proceed with sleep study first to evaluate for obstructive sleep apnea. Further recommendations pending results of sleep study. "  Assessment and Plan: Stefanie White is a 32 y.o. female with: No problem-specific Assessment & Plan notes found for  this encounter.  No follow-ups on file.  No orders of the defined types were placed in this encounter.  Lab Orders  No laboratory test(s) ordered today    Other allergy screening: Asthma: {Blank single:19197::"yes","no"} Rhino conjunctivitis: {Blank single:19197::"yes","no"} Food allergy: {Blank single:19197::"yes","no"} Medication allergy: {Blank single:19197::"yes","no"} Hymenoptera allergy: {Blank single:19197::"yes","no"} Urticaria: {Blank single:19197::"yes","no"} Eczema:{Blank single:19197::"yes","no"} History of recurrent infections suggestive of immunodeficency: {Blank single:19197::"yes","no"}  Diagnostics: Spirometry:  Tracings reviewed. Her effort: {Blank single:19197::"Good reproducible efforts.","It was hard to get consistent efforts and there is a question as to whether this reflects a maximal maneuver.","Poor effort, data can not be interpreted."} FVC: ***L FEV1: ***L, ***% predicted FEV1/FVC ratio: ***% Interpretation: {Blank single:19197::"Spirometry consistent with mild obstructive disease","Spirometry consistent with moderate obstructive disease","Spirometry consistent with severe obstructive disease","Spirometry consistent with possible restrictive disease","Spirometry consistent with mixed obstructive and restrictive disease","Spirometry uninterpretable due to technique","Spirometry consistent with normal pattern","No overt abnormalities noted given today's efforts"}.  Please see scanned spirometry results for details.  Skin Testing: {Blank single:19197::"Select foods","Environmental allergy panel","Environmental allergy panel and select foods","Food allergy panel","None","Deferred due to recent antihistamines use"}. *** Results discussed with patient/family.   Past Medical History: Patient Active Problem List   Diagnosis Date Noted   Stress reaction 07/25/2019   Sensation of chest tightness 07/25/2019   Hypertriglyceridemia without hypercholesterolemia  07/25/2019   Low HDL (under 40) 07/25/2019   Alopecia 07/25/2019   Tachycardia 06/09/2018   Sinus headache 06/09/2018   Physical deconditioning 06/09/2018   Sleeping difficulties 01/17/2018   Contact dermatitis and eczema-bilateral axilla 10/11/2017   Vitamin D insufficiency 05/22/2016   Obesity, Class I, BMI 30-34.9 05/22/2016   Low serum HDL 05/22/2016   Migraine headache  03/01/2016   History of ADHD 03/01/2016   Environmental and seasonal allergies 03/01/2016   h/o Alopecia areata 05/22/2014   Past Medical History:  Diagnosis Date   Alopecia    Recurrent upper respiratory infection (URI)    Seasonal allergies    Past Surgical History: Past Surgical History:  Procedure Laterality Date   CESAREAN SECTION  02/25/2021   Procedure: CESAREAN SECTION;  Surgeon: Charlett Nose, MD;  Location: [facility];  Service: Obstetrics;;   NO PAST SURGERIES     Medication List:  Current Outpatient Medications  Medication Sig Dispense Refill   cetirizine (ZYRTEC) 10 MG chewable tablet Chew 10 mg by mouth daily.     fexofenadine (ALLEGRA ALLERGY) 180 MG tablet Take 1 tablet (180 mg total) by mouth daily for 15 days. 15 tablet 0   fluticasone (FLONASE) 50 MCG/ACT nasal spray Place 2 sprays into both nostrils daily. 16 g 0   norethindrone-ethinyl estradiol-FE (JUNEL FE 1/20) 1-20 MG-MCG tablet Take 1 tablet by mouth daily. 84 tablet 4   pantoprazole (PROTONIX) 40 MG tablet Take 1 tablet (40 mg total) by mouth daily. 30 tablet 1   No current facility-administered medications for this visit.   Allergies: Allergies  Allergen Reactions   Rizatriptan Swelling and Other (See Comments)    Mouth swelling   Social History: Social History   Socioeconomic History   Marital status: Married    Spouse name: Not on file   Number of children: 1   Years of education: Not on file   Highest education level: Not on file  Occupational History   Occupation: [employer]  Tobacco  Use   Smoking status: Former    Packs/day: 0.25    Years: 1.00    Additional pack years: 0.00    Total pack years: 0.25    Types: Cigarettes    Quit date: 06/12/2010    Years since quitting: 12.5    Passive exposure: Never   Smokeless tobacco: Never  Vaping Use   Vaping Use: Never used  Substance and Sexual Activity   Alcohol use: Yes    Alcohol/week: 1.0 standard drink of alcohol    Types: 1 Glasses of wine per week    Comment: occassionally   Drug use: No   Sexual activity: Yes    Partners: Male    Birth control/protection: Pill  Other Topics Concern   Not on file  Social History Narrative   Not on file   Social Determinants of Health   Financial Resource Strain: Not on file  Food Insecurity: Not on file  Transportation Needs: Not on file  Physical Activity: Not on file  Stress: Not on file  Social Connections: Not on file   Lives in a ***. Smoking: *** Occupation: ***  Environmental HistorySurveyor, minerals in the house: Copywriter, advertising in the family room: {Blank single:19197::"yes","no"} Carpet in the bedroom: {Blank single:19197::"yes","no"} Heating: {Blank single:19197::"electric","gas","heat pump"} Cooling: {Blank single:19197::"central","window","heat pump"} Pet: {Blank single:19197::"yes ***","no"}  Family History: Family History  Problem Relation Age of Onset   Kidney Stones Mother    Diabetes Father 88   Hypertension Father 82   Depression Brother 10       bipolar   Cancer Maternal Aunt 45       colon   Heart attack Paternal Grandmother 91   Problem  Relation Asthma                                   *** Eczema                                *** Food allergy                          *** Allergic rhino conjunctivitis     ***  Review of Systems  Constitutional:  Negative for appetite change, chills, fever and unexpected weight change.  HENT:  Negative for congestion and rhinorrhea.    Eyes:  Negative for itching.  Respiratory:  Negative for cough, chest tightness, shortness of breath and wheezing.   Cardiovascular:  Negative for chest pain.  Gastrointestinal:  Negative for abdominal pain.  Genitourinary:  Negative for difficulty urinating.  Skin:  Negative for rash.  Neurological:  Negative for headaches.    Objective: There were no vitals taken for this visit. There is no height or weight on file to calculate BMI. Physical Exam Vitals and nursing note reviewed.  Constitutional:      Appearance: Normal appearance. She is well-developed.  HENT:     Head: Normocephalic and atraumatic.     Right Ear: Tympanic membrane and external ear normal.     Left Ear: Tympanic membrane and external ear normal.     Nose: Nose normal.     Mouth/Throat:     Mouth: Mucous membranes are moist.     Pharynx: Oropharynx is clear.  Eyes:     Conjunctiva/sclera: Conjunctivae normal.  Cardiovascular:     Rate and Rhythm: Normal rate and regular rhythm.     Heart sounds: Normal heart sounds. No murmur heard.    No friction rub. No gallop.  Pulmonary:     Effort: Pulmonary effort is normal.     Breath sounds: Normal breath sounds. No wheezing, rhonchi or rales.  Musculoskeletal:     Cervical back: Neck supple.  Skin:    General: Skin is warm.     Findings: No rash.  Neurological:     Mental Status: She is alert and oriented to person, place, and time.  Psychiatric:        Behavior: Behavior normal.    The plan was reviewed with the patient/family, and all questions/concerned were addressed.  It was my pleasure to see Stefanie White today and participate in her care. Please feel free to contact me with any questions or concerns.  Sincerely,  Wyline Mood, DO Allergy & Immunology  [facility] office: [phone] [facility] office: [phone]

## 2022-12-19 ENCOUNTER — Emergency Department (HOSPITAL_BASED_OUTPATIENT_CLINIC_OR_DEPARTMENT_OTHER)
Admission: EM | Admit: 2022-12-19 | Discharge: 2022-12-19 | Disposition: A | Discharge status: Home / Self Care | Payer: 59 | Attending: Emergency Medicine | Admitting: Emergency Medicine

## 2022-12-19 ENCOUNTER — Other Ambulatory Visit (HOSPITAL_COMMUNITY): Payer: Self-pay

## 2022-12-19 ENCOUNTER — Emergency Department (HOSPITAL_BASED_OUTPATIENT_CLINIC_OR_DEPARTMENT_OTHER): Payer: 59 | Admitting: Radiology

## 2022-12-19 ENCOUNTER — Encounter: Payer: Self-pay | Admitting: Allergy

## 2022-12-19 ENCOUNTER — Emergency Department (HOSPITAL_BASED_OUTPATIENT_CLINIC_OR_DEPARTMENT_OTHER): Payer: 59

## 2022-12-19 ENCOUNTER — Other Ambulatory Visit: Payer: Self-pay

## 2022-12-19 ENCOUNTER — Ambulatory Visit: Payer: 59 | Admitting: Allergy

## 2022-12-19 VITALS — BP 106/72 | HR 88 | Temp 99.2°F | Resp 18 | Ht 63.75 in | Wt 228.5 lb

## 2022-12-19 DIAGNOSIS — R200 Anesthesia of skin: Secondary | ICD-10-CM | POA: Diagnosis not present

## 2022-12-19 DIAGNOSIS — H1013 Acute atopic conjunctivitis, bilateral: Secondary | ICD-10-CM | POA: Diagnosis not present

## 2022-12-19 DIAGNOSIS — J3089 Other allergic rhinitis: Secondary | ICD-10-CM

## 2022-12-19 DIAGNOSIS — K219 Gastro-esophageal reflux disease without esophagitis: Secondary | ICD-10-CM

## 2022-12-19 DIAGNOSIS — R0602 Shortness of breath: Secondary | ICD-10-CM | POA: Diagnosis not present

## 2022-12-19 DIAGNOSIS — J3081 Allergic rhinitis due to animal (cat) (dog) hair and dander: Secondary | ICD-10-CM | POA: Diagnosis not present

## 2022-12-19 DIAGNOSIS — R29898 Other symptoms and signs involving the musculoskeletal system: Secondary | ICD-10-CM | POA: Diagnosis not present

## 2022-12-19 DIAGNOSIS — R9431 Abnormal electrocardiogram [ECG] [EKG]: Secondary | ICD-10-CM | POA: Diagnosis not present

## 2022-12-19 DIAGNOSIS — B999 Unspecified infectious disease: Secondary | ICD-10-CM | POA: Diagnosis not present

## 2022-12-19 DIAGNOSIS — R202 Paresthesia of skin: Secondary | ICD-10-CM | POA: Insufficient documentation

## 2022-12-19 DIAGNOSIS — J301 Allergic rhinitis due to pollen: Secondary | ICD-10-CM | POA: Diagnosis not present

## 2022-12-19 LAB — CBC
HCT: 39.2 % (ref 36.0–46.0)
Hemoglobin: 13.5 g/dL (ref 12.0–15.0)
MCH: 29.9 pg (ref 26.0–34.0)
MCHC: 34.4 g/dL (ref 30.0–36.0)
MCV: 86.7 fL (ref 80.0–100.0)
Platelets: 359 10*3/uL (ref 150–400)
RBC: 4.52 MIL/uL (ref 3.87–5.11)
RDW: 11.9 % (ref 11.5–15.5)
WBC: 10.9 10*3/uL — ABNORMAL HIGH (ref 4.0–10.5)
nRBC: 0 % (ref 0.0–0.2)

## 2022-12-19 LAB — COMPREHENSIVE METABOLIC PANEL
ALT: 33 U/L (ref 0–44)
AST: 26 U/L (ref 15–41)
Albumin: 4.1 g/dL (ref 3.5–5.0)
Alkaline Phosphatase: 86 U/L (ref 38–126)
Anion gap: 8 (ref 5–15)
BUN: 11 mg/dL (ref 6–20)
CO2: 23 mmol/L (ref 22–32)
Calcium: 9.4 mg/dL (ref 8.9–10.3)
Chloride: 104 mmol/L (ref 98–111)
Creatinine, Ser: 0.48 mg/dL (ref 0.44–1.00)
GFR, Estimated: >60 mL/min (ref >60)
Glucose, Bld: 124 mg/dL — ABNORMAL HIGH (ref 70–99)
Potassium: 3.9 mmol/L (ref 3.5–5.1)
Sodium: 135 mmol/L (ref 135–145)
Total Bilirubin: 0.4 mg/dL (ref 0.3–1.2)
Total Protein: 7.1 g/dL (ref 6.5–8.1)

## 2022-12-19 LAB — DIFFERENTIAL
Abs Immature Granulocytes: 0.06 10*3/uL (ref 0.00–0.07)
Basophils Absolute: 0.1 10*3/uL (ref 0.0–0.1)
Basophils Relative: 1 %
Eosinophils Absolute: 0.6 10*3/uL — ABNORMAL HIGH (ref 0.0–0.5)
Eosinophils Relative: 5 %
Immature Granulocytes: 1 %
Lymphocytes Relative: 21 %
Lymphs Abs: 2.3 10*3/uL (ref 0.7–4.0)
Monocytes Absolute: 0.8 10*3/uL (ref 0.1–1.0)
Monocytes Relative: 7 %
Neutro Abs: 7.2 10*3/uL (ref 1.7–7.7)
Neutrophils Relative %: 65 %

## 2022-12-19 LAB — PREGNANCY, URINE: Preg Test, Ur: NEGATIVE

## 2022-12-19 LAB — CBG MONITORING, ED: Glucose-Capillary: 121 mg/dL — ABNORMAL HIGH (ref 70–99)

## 2022-12-19 LAB — ETHANOL: Alcohol, Ethyl (B): <10 mg/dL (ref <10)

## 2022-12-19 MED ORDER — AZELASTINE-FLUTICASONE 137-50 MCG/ACT NA SUSP
1.0000 | Freq: Two times a day (BID) | NASAL | 5 refills | Status: AC
  Filled 2022-12-19: qty 23, 30d supply, fill #0
  Filled 2023-07-12: qty 23, 30d supply, fill #1

## 2022-12-19 MED ORDER — LEVOCETIRIZINE DIHYDROCHLORIDE 5 MG PO TABS
5.0000 mg | ORAL_TABLET | Freq: Every evening | ORAL | 5 refills | Status: DC
  Filled 2022-12-19: qty 30, 30d supply, fill #0
  Filled 2023-01-17 – 2023-01-25 (×2): qty 30, 30d supply, fill #1
  Filled 2023-03-12: qty 30, 30d supply, fill #2

## 2022-12-19 MED ORDER — MONTELUKAST SODIUM 10 MG PO TABS
10.0000 mg | ORAL_TABLET | Freq: Every day | ORAL | 5 refills | Status: DC
  Filled 2022-12-19: qty 30, 30d supply, fill #0
  Filled 2023-01-17 – 2023-01-25 (×2): qty 30, 30d supply, fill #1
  Filled 2023-03-12: qty 30, 30d supply, fill #2

## 2022-12-19 MED ORDER — MELOXICAM 7.5 MG PO TABS
15.0000 mg | ORAL_TABLET | Freq: Every day | ORAL | 0 refills | Status: AC
  Filled 2022-12-19: qty 20, 10d supply, fill #0

## 2022-12-19 MED ORDER — DEXAMETHASONE SODIUM PHOSPHATE 10 MG/ML IJ SOLN
10.0000 mg | Freq: Once | INTRAMUSCULAR | Status: AC
  Administered 2022-12-19: 10 mg via INTRAMUSCULAR
  Filled 2022-12-19: qty 1

## 2022-12-19 NOTE — Discharge Instructions (Signed)
You were seen in the emergency room today with tingling/numbness in the left arm.  I suspect this is from a peripheral nerve compression/irritation.  I am starting you on steroid and anti-inflammatory medications to help resolve this issue.  I would like for you to follow closely with your primary care doctor.  If symptoms persist, you may require neurology evaluation and I have listed their contact information on this form.

## 2022-12-19 NOTE — Patient Instructions (Addendum)
Today's skin testing showed: Positive to grass, weed, ragweed, trees, dust mites, cat, dog. Borderline to mold.  Results given.  Environmental allergies Start environmental control measures as below. Use over the counter antihistamines such as Zyrtec (cetirizine), Claritin (loratadine), Allegra (fexofenadine), or Xyzal (levocetirizine) daily as needed. May take twice a day during allergy flares. May switch antihistamines every few months. Start Singulair (montelukast) 10mg  daily at night. Cautioned that in some children/adults can experience behavioral changes including hyperactivity, agitation, depression, sleep disturbances and suicidal ideations. These side effects are rare, but if you notice them you should notify me and discontinue Singulair (montelukast). Start dymista (fluticasone + azelastine nasal spray combination) 1 spray per nostril twice a day. This replaces Flonase (fluticasone) for now. If it's not covered let us know.  Nasal saline spray (i.e., Simply Saline) or nasal saline lavage (i.e., NeilMed) is recommended as needed and prior to medicated nasal sprays. Had a detailed discussion with patient/family that clinical history is suggestive of allergic rhinitis, and may benefit from allergy immunotherapy (AIT). Discussed in detail regarding the dosing, schedule, side effects (mild to moderate local allergic reaction and rarely systemic allergic reactions including anaphylaxis), and benefits (significant improvement in nasal symptoms, seasonal flares of asthma) of immunotherapy with the patient. There is significant time commitment involved with allergy shots, which includes weekly immunotherapy injections for first 9-12 months and then biweekly to monthly injections for 3-5 years.  Let us know when ready to start 2 shots.   Reflux:  See handout for lifestyle and dietary modifications. May take Protonix 40mg  once day as needed - nothing to eat or drink for 20-30 minutes afterwards.    Infections Keep track of infections and antibiotics use. If persistent will get bloodwork next to look at immune system.   Follow up in 3 months or sooner if needed.    Reducing Pollen Exposure Pollen seasons: trees (spring), grass (summer) and ragweed/weeds (fall). Keep windows closed in your home and car to lower pollen exposure.  Install air conditioning in the bedroom and throughout the house if possible.  Avoid going out in dry windy days - especially early morning. Pollen counts are highest between 5 - 10 AM and on dry, hot and windy days.  Save outside activities for late afternoon or after a heavy rain, when pollen levels are lower.  Avoid mowing of grass if you have grass pollen allergy. Be aware that pollen can also be transported indoors on people and pets.  Dry your clothes in an automatic dryer rather than hanging them outside where they might collect pollen.  Rinse hair and eyes before bedtime. Mold Control Mold and fungi can grow on a variety of surfaces provided certain temperature and moisture conditions exist.  Outdoor molds grow on plants, decaying vegetation and soil. The major outdoor mold, Alternaria and Cladosporium, are found in very high numbers during hot and dry conditions. Generally, a late summer - fall peak is seen for common outdoor fungal spores. Rain will temporarily lower outdoor mold spore count, but counts rise rapidly when the rainy period ends. The most important indoor molds are Aspergillus and Penicillium. Dark, humid and poorly ventilated basements are ideal sites for mold growth. The next most common sites of mold growth are the bathroom and the kitchen. Outdoor (Seasonal) Mold Control Use air conditioning and keep windows closed. Avoid exposure to decaying vegetation. Avoid leaf raking. Avoid grain handling. Consider wearing a face mask if working in moldy areas.  Indoor (Perennial) Mold Control  Maintain  humidity below 50%. Get rid of mold  growth on hard surfaces with water, detergent and, if necessary, 5% bleach (do not mix with other cleaners). Then dry the area completely. If mold covers an area more than 10 square feet, consider hiring an indoor environmental professional. For clothing, washing with soap and water is best. If moldy items cannot be cleaned and dried, throw them away. Remove sources e.g. contaminated carpets. Repair and seal leaking roofs or pipes. Using dehumidifiers in damp basements may be helpful, but empty the water and clean units regularly to prevent mildew from forming. All rooms, especially basements, bathrooms and kitchens, require ventilation and cleaning to deter mold and mildew growth. Avoid carpeting on concrete or damp floors, and storing items in damp areas. Control of House Dust Mite Allergen Dust mite allergens are a common trigger of allergy and asthma symptoms. While they can be found throughout the house, these microscopic creatures thrive in warm, humid environments such as bedding, upholstered furniture and carpeting. Because so much time is spent in the bedroom, it is essential to reduce mite levels there.  Encase pillows, mattresses, and box springs in special allergen-proof fabric covers or airtight, zippered plastic covers.  Bedding should be washed weekly in hot water (130 F) and dried in a hot dryer. Allergen-proof covers are available for comforters and pillows that can't be regularly washed.  Wash the allergy-proof covers every few months. Minimize clutter in the bedroom. Keep pets out of the bedroom.  Keep humidity less than 50% by using a dehumidifier or air conditioning. You can buy a humidity measuring device called a hygrometer to monitor this.  If possible, replace carpets with hardwood, linoleum, or washable area rugs. If that's not possible, vacuum frequently with a vacuum that has a HEPA filter. Remove all upholstered furniture and non-washable window drapes from the  bedroom. Remove all non-washable stuffed toys from the bedroom.  Wash stuffed toys weekly. Pet Allergen Avoidance: Contrary to popular opinion, there are no "hypoallergenic" breeds of dogs or cats. That is because people are not allergic to an animal's hair, but to an allergen found in the animal's saliva, dander (dead skin flakes) or urine. Pet allergy symptoms typically occur within minutes. For some people, symptoms can build up and become most severe 8 to 12 hours after contact with the animal. People with severe allergies can experience reactions in public places if dander has been transported on the pet owners' clothing. Keeping an animal outdoors is only a partial solution, since homes with pets in the yard still have higher concentrations of animal allergens. Before getting a pet, ask your allergist to determine if you are allergic to animals. If your pet is already considered part of your family, try to minimize contact and keep the pet out of the bedroom and other rooms where you spend a great deal of time. As with dust mites, vacuum carpets often or replace carpet with a hardwood floor, tile or linoleum. High-efficiency particulate air (HEPA) cleaners can reduce allergen levels over time. While dander and saliva are the source of cat and dog allergens, urine is the source of allergens from rabbits, hamsters, mice and [place] pigs; so ask a non-allergic family member to clean the animal's cage. If you have a pet allergy, talk to your allergist about the potential for allergy immunotherapy (allergy shots). This strategy can often provide long-term relief.

## 2022-12-19 NOTE — ED Triage Notes (Addendum)
Patient here POV from Home.  Endorses Weakness/Numbness for 3 Days to Left Arm. States it has been mostly constant but wavers in intensity at times. Has been consistent today  Noted some SOB as well for a few days as well. No Pain. No N/V.   NAD Noted during Triage. A&Ox4. GCS 15. Ambulatory.

## 2022-12-19 NOTE — ED Provider Notes (Signed)
Emergency Department Provider Note   I have reviewed the triage vital signs and the nursing notes.   HISTORY  Chief Complaint Numbness   HPI Stefanie White is a 32 y.o. female past history reviewed presents emergency department with tingling/numbness to the left arm.  {**SYMPTOM/COMPLAINT  LOCATION (describe anatomically) DURATION (when did it start) TIMING (onset and pattern) SEVERITY (0-10, mild/moderate/severe) QUALITY (description of symptoms) CONTEXT (recent surgery, new meds, activity, etc.) MODIFYINGFACTORS (what makes it better/worse) ASSOCIATEDSYMPTOMS (pertinent positives and negatives)**}  Past Medical History:  Diagnosis Date   Alopecia    Recurrent upper respiratory infection (URI)    Seasonal allergies     Review of Systems {** Revise as appropriate then delete this line - Documentation of 10 systems OR 2 systems and "10-point ROS otherwise negative" is required **}Constitutional: No fever/chills Eyes: No visual changes. ENT: No sore throat. Cardiovascular: Denies chest pain. Respiratory: Denies shortness of breath. Gastrointestinal: No abdominal pain.  No nausea, no vomiting.  No diarrhea.  No constipation. Genitourinary: Negative for dysuria. Musculoskeletal: Negative for back pain. Skin: Negative for rash. Neurological: Negative for headaches, focal weakness or numbness. {**Psychiatric:  Endocrine:  Hematological/Lymphatic:  Allergic/Immunilogical: **}  ____________________________________________   PHYSICAL EXAM:  VITAL SIGNS: ED Triage Vitals [12/19/22 1139]  Enc Vitals Group     BP 130/80     Pulse Rate 97     Resp 15     Temp 97.7 F (36.5 C)     Temp Source Oral     SpO2 99 %     Weight      Height      Head Circumference      Peak Flow      Pain Score      Pain Loc      Pain Edu?      Excl. in GC?    {** Revise as appropriate then delete this line - 8 systems required **} Constitutional: Alert and oriented. Well  appearing and in no acute distress. Eyes: Conjunctivae are normal. PERRL. EOMI. Head: Atraumatic. {**Ears:  Healthy appearing ear canals and TMs bilaterally **}Nose: No congestion/rhinnorhea. Mouth/Throat: Mucous membranes are moist.  Oropharynx non-erythematous. Neck: No stridor.  No meningeal signs.  {**No cervical spine tenderness to palpation.**} Cardiovascular: Normal rate, regular rhythm. Good peripheral circulation. Grossly normal heart sounds.   Respiratory: Normal respiratory effort.  No retractions. Lungs CTAB. Gastrointestinal: Soft and nontender. No distention.  {**Genitourinary:  **}Musculoskeletal: No lower extremity tenderness nor edema. No gross deformities of extremities. Neurologic:  Normal speech and language. No gross focal neurologic deficits are appreciated.  Skin:  Skin is warm, dry and intact. No rash noted. {**Psychiatric: Mood and affect are normal. Speech and behavior are normal.**}  ____________________________________________   LABS (all labs ordered are listed, but only abnormal results are displayed)  Labs Reviewed  CBC - Abnormal; Notable for the following components:      Result Value   WBC 10.9 (*)    All other components within normal limits  DIFFERENTIAL - Abnormal; Notable for the following components:   Eosinophils Absolute 0.6 (*)    All other components within normal limits  COMPREHENSIVE METABOLIC PANEL - Abnormal; Notable for the following components:   Glucose, Bld 124 (*)    All other components within normal limits  CBG MONITORING, ED - Abnormal; Notable for the following components:   Glucose-Capillary 121 (*)    All other components within normal limits  ETHANOL  PREGNANCY, URINE   ____________________________________________  EKG  *** ____________________________________________  RADIOLOGY  CT HEAD WO CONTRAST  Result Date: 12/19/2022 CLINICAL DATA:  Intermittent left arm numbness and weakness for the past 3 days. EXAM:  CT HEAD WITHOUT CONTRAST TECHNIQUE: Contiguous axial images were obtained from the base of the skull through the vertex without intravenous contrast. RADIATION DOSE REDUCTION: This exam was performed according to the departmental dose-optimization program which includes automated exposure control, adjustment of the mA and/or kV according to patient size and/or use of iterative reconstruction technique. COMPARISON:  None Available. FINDINGS: Brain: No evidence of acute infarction, hemorrhage, hydrocephalus, extra-axial collection or mass lesion/mass effect. Vascular: No hyperdense vessel or unexpected calcification. Skull: Normal. Negative for fracture or focal lesion. Sinuses/Orbits: No acute finding. Other: None. IMPRESSION: 1. No acute intracranial abnormality. Electronically Signed   By: Obie Dredge M.D.   On: 12/19/2022 12:16   DG Chest 2 View  Result Date: 12/19/2022 CLINICAL DATA:  Shortness of breath EXAM: CHEST - 2 VIEW COMPARISON:  Chest x-ray February 05, 2022 FINDINGS: The cardiomediastinal silhouette is unchanged in contour. No focal pulmonary opacity. No pleural effusion or pneumothorax. The visualized upper abdomen is unremarkable. No acute osseous abnormality. IMPRESSION: No acute cardiopulmonary abnormality. Electronically Signed   By: Jacob Moores M.D.   On: 12/19/2022 12:14    ____________________________________________   PROCEDURES  Procedure(s) performed:   Procedures   ____________________________________________   INITIAL IMPRESSION / ASSESSMENT AND PLAN / ED COURSE  Pertinent labs & imaging results that were available during my care of the patient were reviewed by me and considered in my medical decision making (see chart for details).   This patient is Presenting for Evaluation of ***, which {Range:23949} require a range of treatment options, and {MDMcomplaint:23950} a complaint that involves a {MDMlevelrisk:23951} risk of morbidity and mortality.  The  Differential Diagnoses include***.  Critical Interventions-    Medications - No data to display  Reassessment after intervention:     I *** Additional Historical Information from ***, as the patient is ***.  I decided to review pertinent External Data, and in summary ***.   Clinical Laboratory Tests Ordered, included   Radiologic Tests Ordered, included ***. I independently interpreted the images and agree with radiology interpretation.   Cardiac Monitor Tracing which shows ***   Social Determinants of Health Risk ***  Consult complete with  Medical Decision Making: Summary: ***  Reevaluation with update and discussion with   ***Considered admission***  Patient's presentation is most consistent with {EM COPA:27473}   Disposition:   ____________________________________________  FINAL CLINICAL IMPRESSION(S) / ED DIAGNOSES  Final diagnoses:  None     NEW OUTPATIENT MEDICATIONS STARTED DURING THIS VISIT:  New Prescriptions   No medications on file    Note:  This document was prepared using Dragon voice recognition software and may include unintentional dictation errors.  Alona Bene, MD, [facility]

## 2022-12-20 ENCOUNTER — Telehealth: Payer: Self-pay

## 2022-12-20 ENCOUNTER — Encounter: Payer: Self-pay | Admitting: Allergy

## 2022-12-20 ENCOUNTER — Other Ambulatory Visit (HOSPITAL_COMMUNITY): Payer: Self-pay

## 2022-12-20 DIAGNOSIS — B999 Unspecified infectious disease: Secondary | ICD-10-CM | POA: Insufficient documentation

## 2022-12-20 DIAGNOSIS — K219 Gastro-esophageal reflux disease without esophagitis: Secondary | ICD-10-CM | POA: Insufficient documentation

## 2022-12-20 DIAGNOSIS — H1013 Acute atopic conjunctivitis, bilateral: Secondary | ICD-10-CM | POA: Insufficient documentation

## 2022-12-20 NOTE — Assessment & Plan Note (Signed)
.   See assessment and plan as above. 

## 2022-12-20 NOTE — Assessment & Plan Note (Addendum)
Perennial rhinoconjunctivitis symptoms for 10 years which flares in the spring.  Tried antihistamines with some benefit.  No recent allergy testing and no prior AIT.  Saw ENT and unremarkable workup. 3 cats at home. Today's skin testing showed: Positive to grass, weed, ragweed, trees, dust mites, cat, dog. Borderline to mold. Start environmental control measures as below. Use over the counter antihistamines such as Zyrtec (cetirizine), Claritin (loratadine), Allegra (fexofenadine), or Xyzal (levocetirizine) daily as needed. May take twice a day during allergy flares. May switch antihistamines every few months. Start Singulair (montelukast) 10mg  daily at night. Cautioned that in some children/adults can experience behavioral changes including hyperactivity, agitation, depression, sleep disturbances and suicidal ideations. These side effects are rare, but if you notice them you should notify me and discontinue Singulair (montelukast). Start dymista (fluticasone + azelastine nasal spray combination) 1 spray per nostril twice a day. This replaces Flonase (fluticasone) for now. If it's not covered let us know.  Nasal saline spray (i.e., Simply Saline) or nasal saline lavage (i.e., NeilMed) is recommended as needed and prior to medicated nasal sprays. Had a detailed discussion with patient/family that clinical history is suggestive of allergic rhinitis, and may benefit from allergy immunotherapy (AIT). Discussed in detail regarding the dosing, schedule, side effects (mild to moderate local allergic reaction and rarely systemic allergic reactions including anaphylaxis), and benefits (significant improvement in nasal symptoms, seasonal flares of asthma) of immunotherapy with the patient. There is significant time commitment involved with allergy shots, which includes weekly immunotherapy injections for first 9-12 months and then biweekly to monthly injections for 3-5 years.  Let us know when ready to start 2  shots.  Discussed rush vs traditional build up.

## 2022-12-20 NOTE — Telephone Encounter (Signed)
*  Asthma/Allergy  PA request received for Azelastine-Fluticasone 137-50MCG/ACT suspension  PA submitted to [employer] via CMM and is pending additional questions/determination  Key: [id number]

## 2022-12-20 NOTE — Assessment & Plan Note (Signed)
At least 1 sinus infections per year and at times requiring multiple courses of antibiotics. Keep track of infections and antibiotics use. If persistent will get bloodwork next to look at immune system.

## 2022-12-20 NOTE — Assessment & Plan Note (Signed)
See handout for lifestyle and dietary modifications. May take Protonix 40mg  once day as needed - nothing to eat or drink for 20-30 minutes afterwards.

## 2022-12-21 NOTE — Telephone Encounter (Signed)
PA has been APPROVED from 12/20/2022-12/18/2023

## 2022-12-22 ENCOUNTER — Other Ambulatory Visit (HOSPITAL_COMMUNITY): Payer: Self-pay

## 2023-01-17 ENCOUNTER — Other Ambulatory Visit: Payer: Self-pay | Admitting: Family Medicine

## 2023-01-17 ENCOUNTER — Other Ambulatory Visit (HOSPITAL_COMMUNITY): Payer: Self-pay

## 2023-01-17 ENCOUNTER — Other Ambulatory Visit: Payer: Self-pay

## 2023-01-17 DIAGNOSIS — K219 Gastro-esophageal reflux disease without esophagitis: Secondary | ICD-10-CM

## 2023-01-17 MED ORDER — PANTOPRAZOLE SODIUM 40 MG PO TBEC
40.0000 mg | DELAYED_RELEASE_TABLET | Freq: Every day | ORAL | 1 refills | Status: DC
Start: 2023-01-17 — End: 2023-04-06
  Filled 2023-01-17: qty 30, 30d supply, fill #0
  Filled 2023-02-26: qty 30, 30d supply, fill #1

## 2023-01-18 DIAGNOSIS — G4733 Obstructive sleep apnea (adult) (pediatric): Secondary | ICD-10-CM | POA: Diagnosis not present

## 2023-01-22 ENCOUNTER — Other Ambulatory Visit: Payer: Self-pay

## 2023-01-25 ENCOUNTER — Other Ambulatory Visit: Payer: Self-pay

## 2023-02-18 DIAGNOSIS — G4733 Obstructive sleep apnea (adult) (pediatric): Secondary | ICD-10-CM | POA: Diagnosis not present

## 2023-02-24 DIAGNOSIS — G4733 Obstructive sleep apnea (adult) (pediatric): Secondary | ICD-10-CM | POA: Diagnosis not present

## 2023-03-19 DIAGNOSIS — J342 Deviated nasal septum: Secondary | ICD-10-CM | POA: Diagnosis not present

## 2023-03-19 DIAGNOSIS — J3089 Other allergic rhinitis: Secondary | ICD-10-CM | POA: Diagnosis not present

## 2023-03-19 DIAGNOSIS — G4733 Obstructive sleep apnea (adult) (pediatric): Secondary | ICD-10-CM | POA: Diagnosis not present

## 2023-03-19 DIAGNOSIS — J351 Hypertrophy of tonsils: Secondary | ICD-10-CM | POA: Diagnosis not present

## 2023-03-19 NOTE — Progress Notes (Unsigned)
Follow Up Note  RE: Stefanie White MRN: [id number] DOB: Mar 16, 1991 Date of Office Visit: 03/20/2023  Referring provider: Suzan Slick, MD Primary care provider: Suzan Slick, MD  Chief Complaint: No chief complaint on file.  History of Present Illness: I had the pleasure of seeing [name] for a follow up visit at the [facility] on 03/19/2023. She is a 32 y.o. female, who is being followed for allergic rhino conjunctivitis, recurrent infections and GERD. Her previous allergy office visit was on 12/19/2022 with Dr. Selena Batten. Today is a regular follow up visit.  Discussed the use of AI scribe software for clinical note transcription with the patient, who gave verbal consent to proceed.  History of Present Illness            Other allergic rhinitis Perennial rhinoconjunctivitis symptoms for 10 years which flares in the spring.  Tried antihistamines with some benefit.  No recent allergy testing and no prior AIT.  Saw ENT and unremarkable workup. 3 cats at home. Today's skin testing showed: Positive to grass, weed, ragweed, trees, dust mites, cat, dog. Borderline to mold. Start environmental control measures as below. Use over the counter antihistamines such as Zyrtec (cetirizine), Claritin (loratadine), Allegra (fexofenadine), or Xyzal (levocetirizine) daily as needed. May take twice a day during allergy flares. May switch antihistamines every few months. Start Singulair (montelukast) 10mg  daily at night. Cautioned that in some children/adults can experience behavioral changes including hyperactivity, agitation, depression, sleep disturbances and suicidal ideations. These side effects are rare, but if you notice them you should notify me and discontinue Singulair (montelukast). Start dymista (fluticasone + azelastine nasal spray combination) 1 spray per nostril twice a day. This replaces Flonase (fluticasone) for now. If it's not covered let us know.  Nasal  saline spray (i.e., Simply Saline) or nasal saline lavage (i.e., NeilMed) is recommended as needed and prior to medicated nasal sprays. Had a detailed discussion with patient/family that clinical history is suggestive of allergic rhinitis, and may benefit from allergy immunotherapy (AIT). Discussed in detail regarding the dosing, schedule, side effects (mild to moderate local allergic reaction and rarely systemic allergic reactions including anaphylaxis), and benefits (significant improvement in nasal symptoms, seasonal flares of asthma) of immunotherapy with the patient. There is significant time commitment involved with allergy shots, which includes weekly immunotherapy injections for first 9-12 months and then biweekly to monthly injections for 3-5 years.  Let us know when ready to start 2 shots.  Discussed rush vs traditional build up.       Recurrent infections At least 1 sinus infections per year and at times requiring multiple courses of antibiotics. Keep track of infections and antibiotics use. If persistent will get bloodwork next to look at immune system.    Gastroesophageal reflux disease See handout for lifestyle and dietary modifications. May take Protonix 40mg  once day as needed - nothing to eat or drink for 20-30 minutes afterwards.    Return in about 3 months (around 03/21/2023).  Assessment and Plan: Stefanie White is a 32 y.o. female with: Seasonal allergic rhinitis due to pollen Allergic rhinitis due to dust mite Allergic rhinitis due to animal dander Allergic rhinitis due to mold Allergic conjunctivitis of both eyes Past history - Perennial rhinoconjunctivitis symptoms for 10 years which flares in the spring.  No prior AIT.  Saw ENT and unremarkable workup. 3 cats at home. 2024 kin testing Positive to grass, weed, ragweed, trees, dust mites, cat, dog. Borderline to mold. Interim  history -   Recurrent infections Past history - At least 1 sinus infections per year and at times  requiring multiple courses of antibiotics.  Gastroesophageal reflux disease, unspecified whether esophagitis present ***  Assessment and Plan              No follow-ups on file.  No orders of the defined types were placed in this encounter.  Lab Orders  No laboratory test(s) ordered today    Diagnostics: None.   Medication List:  Current Outpatient Medications  Medication Sig Dispense Refill   Azelastine-Fluticasone 137-50 MCG/ACT SUSP Place 1 spray into the nose in the morning and at bedtime. 23 g 5   levocetirizine (XYZAL) 5 MG tablet Take 1 tablet (5 mg total) by mouth every evening. 30 tablet 5   montelukast (SINGULAIR) 10 MG tablet Take 1 tablet (10 mg total) by mouth at bedtime. 30 tablet 5   Norethin Ace-Eth Estrad-FE (TARINA FE 1/20 PO) Take by mouth.     norethindrone-ethinyl estradiol-FE (JUNEL FE 1/20) 1-20 MG-MCG tablet Take 1 tablet by mouth daily. (Patient not taking: Reported on 12/19/2022) 84 tablet 4   pantoprazole (PROTONIX) 40 MG tablet Take 1 tablet (40 mg total) by mouth daily. 30 tablet 1   No current facility-administered medications for this visit.   Allergies: Allergies  Allergen Reactions   Rizatriptan Swelling and Other (See Comments)    Mouth swelling   I reviewed her past medical history, social history, family history, and environmental history and no significant changes have been reported from her previous visit.  Review of Systems  Constitutional:  Negative for appetite change, chills, fever and unexpected weight change.  HENT:  Positive for congestion and postnasal drip. Negative for rhinorrhea.   Eyes:  Negative for itching.  Respiratory:  Negative for cough, chest tightness, shortness of breath and wheezing.   Cardiovascular:  Negative for chest pain.  Gastrointestinal:  Negative for abdominal pain.  Genitourinary:  Negative for difficulty urinating.  Skin:  Negative for rash.  Allergic/Immunologic: Positive for environmental  allergies.  Neurological:  Positive for headaches.    Objective: There were no vitals taken for this visit. There is no height or weight on file to calculate BMI. Physical Exam Vitals and nursing note reviewed.  Constitutional:      Appearance: Normal appearance. She is well-developed.  HENT:     Head: Normocephalic and atraumatic.     Right Ear: Tympanic membrane and external ear normal.     Left Ear: Tympanic membrane and external ear normal.     Nose: Nose normal.     Mouth/Throat:     Mouth: Mucous membranes are moist.     Pharynx: Oropharynx is clear.  Eyes:     Conjunctiva/sclera: Conjunctivae normal.  Cardiovascular:     Rate and Rhythm: Normal rate and regular rhythm.     Heart sounds: Normal heart sounds. No murmur heard.    No friction rub. No gallop.  Pulmonary:     Effort: Pulmonary effort is normal.     Breath sounds: Normal breath sounds. No wheezing, rhonchi or rales.  Musculoskeletal:     Cervical back: Neck supple.  Skin:    General: Skin is warm.     Findings: No rash.  Neurological:     Mental Status: She is alert and oriented to person, place, and time.  Psychiatric:        Behavior: Behavior normal.    Previous notes and tests were reviewed. The plan was  reviewed with the patient/family, and all questions/concerned were addressed.  It was my pleasure to see Kidada today and participate in her care. Please feel free to contact me with any questions or concerns.  Sincerely,  Wyline Mood, DO Allergy & Immunology  [facility] office: [phone] [facility] office: [phone]

## 2023-03-20 ENCOUNTER — Ambulatory Visit: Payer: 59 | Admitting: Allergy

## 2023-03-20 ENCOUNTER — Encounter: Payer: Self-pay | Admitting: Allergy

## 2023-03-20 ENCOUNTER — Other Ambulatory Visit (HOSPITAL_COMMUNITY): Payer: Self-pay

## 2023-03-20 ENCOUNTER — Other Ambulatory Visit: Payer: Self-pay

## 2023-03-20 VITALS — BP 110/72 | HR 83 | Temp 97.9°F | Resp 16

## 2023-03-20 DIAGNOSIS — B999 Unspecified infectious disease: Secondary | ICD-10-CM | POA: Diagnosis not present

## 2023-03-20 DIAGNOSIS — J301 Allergic rhinitis due to pollen: Secondary | ICD-10-CM

## 2023-03-20 DIAGNOSIS — G4733 Obstructive sleep apnea (adult) (pediatric): Secondary | ICD-10-CM | POA: Diagnosis not present

## 2023-03-20 DIAGNOSIS — J3081 Allergic rhinitis due to animal (cat) (dog) hair and dander: Secondary | ICD-10-CM

## 2023-03-20 DIAGNOSIS — J3089 Other allergic rhinitis: Secondary | ICD-10-CM | POA: Diagnosis not present

## 2023-03-20 DIAGNOSIS — H1013 Acute atopic conjunctivitis, bilateral: Secondary | ICD-10-CM

## 2023-03-20 DIAGNOSIS — K219 Gastro-esophageal reflux disease without esophagitis: Secondary | ICD-10-CM

## 2023-03-20 MED ORDER — LEVOCETIRIZINE DIHYDROCHLORIDE 5 MG PO TABS
5.0000 mg | ORAL_TABLET | Freq: Every evening | ORAL | 3 refills | Status: DC
  Filled 2023-03-20 – 2023-04-19 (×3): qty 90, 90d supply, fill #0
  Filled 2023-07-18: qty 90, 90d supply, fill #1
  Filled 2023-11-14: qty 30, 30d supply, fill #2
  Filled 2023-12-11: qty 30, 30d supply, fill #3
  Filled 2024-02-01 (×2): qty 30, 30d supply, fill #4
  Filled 2024-03-03: qty 30, 30d supply, fill #5

## 2023-03-20 MED ORDER — MONTELUKAST SODIUM 10 MG PO TABS
10.0000 mg | ORAL_TABLET | Freq: Every day | ORAL | 3 refills | Status: DC
  Filled 2023-03-20 – 2023-04-19 (×3): qty 90, 90d supply, fill #0
  Filled 2023-07-18: qty 90, 90d supply, fill #1
  Filled 2023-11-14: qty 30, 30d supply, fill #2
  Filled 2023-12-11: qty 30, 30d supply, fill #3
  Filled 2024-02-01 (×2): qty 30, 30d supply, fill #4
  Filled 2024-03-03: qty 30, 30d supply, fill #5

## 2023-03-20 NOTE — Patient Instructions (Addendum)
Environmental allergies 2024 skin testing Positive to grass, weed, ragweed, trees, dust mites, cat, dog. Borderline to mold. Continue environmental control measures as below. Use over the counter antihistamines such as Zyrtec (cetirizine), Claritin (loratadine), Allegra (fexofenadine), or Xyzal (levocetirizine) daily as needed. May take twice a day during allergy flares. May switch antihistamines every few months. Continue Singulair (montelukast) 10mg  daily at night. Continue dymista (fluticasone + azelastine nasal spray combination) 1 spray per nostril twice a day. Nasal saline spray (i.e., Simply Saline) or nasal saline lavage (i.e., NeilMed) is recommended as needed and prior to medicated nasal sprays. Consider allergy injections for long term control if above medications do not help the symptoms.  Let us know when ready to start.   Reflux Continue lifestyle and dietary modifications. May take Protonix 40mg  once day as needed - nothing to eat or drink for 20-30 minutes afterwards.   Infections Keep track of infections and antibiotics use. If persistent will get bloodwork next to look at immune system.   Follow up in 3 months or sooner if needed.    Reducing Pollen Exposure Pollen seasons: trees (spring), grass (summer) and ragweed/weeds (fall). Keep windows closed in your home and car to lower pollen exposure.  Install air conditioning in the bedroom and throughout the house if possible.  Avoid going out in dry windy days - especially early morning. Pollen counts are highest between 5 - 10 AM and on dry, hot and windy days.  Save outside activities for late afternoon or after a heavy rain, when pollen levels are lower.  Avoid mowing of grass if you have grass pollen allergy. Be aware that pollen can also be transported indoors on people and pets.  Dry your clothes in an automatic dryer rather than hanging them outside where they might collect pollen.  Rinse hair and eyes before  bedtime. Mold Control Mold and fungi can grow on a variety of surfaces provided certain temperature and moisture conditions exist.  Outdoor molds grow on plants, decaying vegetation and soil. The major outdoor mold, Alternaria and Cladosporium, are found in very high numbers during hot and dry conditions. Generally, a late summer - fall peak is seen for common outdoor fungal spores. Rain will temporarily lower outdoor mold spore count, but counts rise rapidly when the rainy period ends. The most important indoor molds are Aspergillus and Penicillium. Dark, humid and poorly ventilated basements are ideal sites for mold growth. The next most common sites of mold growth are the bathroom and the kitchen. Outdoor (Seasonal) Mold Control Use air conditioning and keep windows closed. Avoid exposure to decaying vegetation. Avoid leaf raking. Avoid grain handling. Consider wearing a face mask if working in moldy areas.  Indoor (Perennial) Mold Control  Maintain humidity below 50%. Get rid of mold growth on hard surfaces with water, detergent and, if necessary, 5% bleach (do not mix with other cleaners). Then dry the area completely. If mold covers an area more than 10 square feet, consider hiring an indoor environmental professional. For clothing, washing with soap and water is best. If moldy items cannot be cleaned and dried, throw them away. Remove sources e.g. contaminated carpets. Repair and seal leaking roofs or pipes. Using dehumidifiers in damp basements may be helpful, but empty the water and clean units regularly to prevent mildew from forming. All rooms, especially basements, bathrooms and kitchens, require ventilation and cleaning to deter mold and mildew growth. Avoid carpeting on concrete or damp floors, and storing items in damp areas. Control of  House Dust Mite Allergen Dust mite allergens are a common trigger of allergy and asthma symptoms. While they can be found throughout the house, these  microscopic creatures thrive in warm, humid environments such as bedding, upholstered furniture and carpeting. Because so much time is spent in the bedroom, it is essential to reduce mite levels there.  Encase pillows, mattresses, and box springs in special allergen-proof fabric covers or airtight, zippered plastic covers.  Bedding should be washed weekly in hot water (130 F) and dried in a hot dryer. Allergen-proof covers are available for comforters and pillows that can't be regularly washed.  Wash the allergy-proof covers every few months. Minimize clutter in the bedroom. Keep pets out of the bedroom.  Keep humidity less than 50% by using a dehumidifier or air conditioning. You can buy a humidity measuring device called a hygrometer to monitor this.  If possible, replace carpets with hardwood, linoleum, or washable area rugs. If that's not possible, vacuum frequently with a vacuum that has a HEPA filter. Remove all upholstered furniture and non-washable window drapes from the bedroom. Remove all non-washable stuffed toys from the bedroom.  Wash stuffed toys weekly. Pet Allergen Avoidance: Contrary to popular opinion, there are no "hypoallergenic" breeds of dogs or cats. That is because people are not allergic to an animal's hair, but to an allergen found in the animal's saliva, dander (dead skin flakes) or urine. Pet allergy symptoms typically occur within minutes. For some people, symptoms can build up and become most severe 8 to 12 hours after contact with the animal. People with severe allergies can experience reactions in public places if dander has been transported on the pet owners' clothing. Keeping an animal outdoors is only a partial solution, since homes with pets in the yard still have higher concentrations of animal allergens. Before getting a pet, ask your allergist to determine if you are allergic to animals. If your pet is already considered part of your family, try to minimize contact  and keep the pet out of the bedroom and other rooms where you spend a great deal of time. As with dust mites, vacuum carpets often or replace carpet with a hardwood floor, tile or linoleum. High-efficiency particulate air (HEPA) cleaners can reduce allergen levels over time. While dander and saliva are the source of cat and dog allergens, urine is the source of allergens from rabbits, hamsters, mice and [place] pigs; so ask a non-allergic family member to clean the animal's cage. If you have a pet allergy, talk to your allergist about the potential for allergy immunotherapy (allergy shots). This strategy can often provide long-term relief.

## 2023-03-26 DIAGNOSIS — G4733 Obstructive sleep apnea (adult) (pediatric): Secondary | ICD-10-CM | POA: Diagnosis not present

## 2023-04-06 ENCOUNTER — Other Ambulatory Visit (HOSPITAL_COMMUNITY): Payer: Self-pay

## 2023-04-06 ENCOUNTER — Other Ambulatory Visit: Payer: Self-pay | Admitting: Family Medicine

## 2023-04-06 DIAGNOSIS — K219 Gastro-esophageal reflux disease without esophagitis: Secondary | ICD-10-CM

## 2023-04-06 MED ORDER — PANTOPRAZOLE SODIUM 40 MG PO TBEC
40.0000 mg | DELAYED_RELEASE_TABLET | Freq: Every day | ORAL | 1 refills | Status: DC
Start: 2023-04-06 — End: 2023-06-15
  Filled 2023-04-06: qty 30, 30d supply, fill #0
  Filled 2023-04-19 – 2023-05-11 (×2): qty 30, 30d supply, fill #1

## 2023-04-07 DIAGNOSIS — G4733 Obstructive sleep apnea (adult) (pediatric): Secondary | ICD-10-CM | POA: Diagnosis not present

## 2023-04-17 ENCOUNTER — Encounter (HOSPITAL_COMMUNITY): Payer: Self-pay

## 2023-04-17 ENCOUNTER — Other Ambulatory Visit: Payer: Self-pay

## 2023-04-17 ENCOUNTER — Other Ambulatory Visit (HOSPITAL_COMMUNITY): Payer: Self-pay

## 2023-04-18 ENCOUNTER — Other Ambulatory Visit (HOSPITAL_COMMUNITY): Payer: Self-pay

## 2023-04-19 ENCOUNTER — Other Ambulatory Visit (HOSPITAL_COMMUNITY): Payer: Self-pay

## 2023-04-19 ENCOUNTER — Other Ambulatory Visit: Payer: Self-pay

## 2023-04-20 DIAGNOSIS — F802 Mixed receptive-expressive language disorder: Secondary | ICD-10-CM | POA: Diagnosis not present

## 2023-04-20 DIAGNOSIS — G4733 Obstructive sleep apnea (adult) (pediatric): Secondary | ICD-10-CM | POA: Diagnosis not present

## 2023-04-26 DIAGNOSIS — G4733 Obstructive sleep apnea (adult) (pediatric): Secondary | ICD-10-CM | POA: Diagnosis not present

## 2023-05-03 ENCOUNTER — Other Ambulatory Visit (HOSPITAL_COMMUNITY): Payer: Self-pay

## 2023-05-03 DIAGNOSIS — R350 Frequency of micturition: Secondary | ICD-10-CM | POA: Diagnosis not present

## 2023-05-03 DIAGNOSIS — Z01419 Encounter for gynecological examination (general) (routine) without abnormal findings: Secondary | ICD-10-CM | POA: Diagnosis not present

## 2023-05-03 MED ORDER — NORETHIN ACE-ETH ESTRAD-FE 1-20 MG-MCG PO TABS
1.0000 | ORAL_TABLET | Freq: Every day | ORAL | 4 refills | Status: DC
  Filled 2023-05-03 – 2023-05-11 (×4): qty 84, 84d supply, fill #0
  Filled 2023-07-12 – 2023-07-18 (×2): qty 84, 84d supply, fill #1
  Filled 2023-08-27: qty 28, 28d supply, fill #1
  Filled 2023-08-29: qty 56, 56d supply, fill #2
  Filled 2023-09-18: qty 28, 28d supply, fill #2
  Filled 2023-10-17: qty 28, 28d supply, fill #3
  Filled 2023-11-14: qty 28, 28d supply, fill #4
  Filled 2023-12-11: qty 28, 28d supply, fill #5
  Filled 2024-01-08: qty 28, 28d supply, fill #6
  Filled 2024-02-06: qty 28, 28d supply, fill #7
  Filled 2024-03-03: qty 28, 28d supply, fill #8
  Filled 2024-03-28: qty 28, 28d supply, fill #9
  Filled 2024-05-02: qty 28, 28d supply, fill #10

## 2023-05-08 DIAGNOSIS — G4733 Obstructive sleep apnea (adult) (pediatric): Secondary | ICD-10-CM | POA: Diagnosis not present

## 2023-05-11 ENCOUNTER — Other Ambulatory Visit (HOSPITAL_COMMUNITY): Payer: Self-pay

## 2023-05-11 ENCOUNTER — Other Ambulatory Visit: Payer: Self-pay

## 2023-05-11 ENCOUNTER — Encounter (HOSPITAL_COMMUNITY): Payer: Self-pay

## 2023-05-16 ENCOUNTER — Encounter: Payer: Self-pay | Admitting: Family Medicine

## 2023-05-18 DIAGNOSIS — F802 Mixed receptive-expressive language disorder: Secondary | ICD-10-CM | POA: Diagnosis not present

## 2023-05-20 DIAGNOSIS — G4733 Obstructive sleep apnea (adult) (pediatric): Secondary | ICD-10-CM | POA: Diagnosis not present

## 2023-05-21 ENCOUNTER — Ambulatory Visit (INDEPENDENT_AMBULATORY_CARE_PROVIDER_SITE_OTHER): Payer: 59 | Admitting: Family Medicine

## 2023-05-21 VITALS — BP 116/67 | HR 73 | Temp 98.0°F | Resp 18 | Ht 63.75 in | Wt 224.0 lb

## 2023-05-21 DIAGNOSIS — S99912S Unspecified injury of left ankle, sequela: Secondary | ICD-10-CM

## 2023-05-21 NOTE — Progress Notes (Signed)
   Acute Office Visit  Subjective:     Patient ID: Stefanie White, female    DOB: [birth year], 32 y.o.   MRN: [id number]  Chief Complaint  Patient presents with   return to work Letter    Patient states that she needs a letter for a new employer stating that she can work without restrictions , from when she hurt her ankle four years ago     HPI Patient is in today for acute visit.   She reports she use to be on worker's comp with [facility] and had restrictions for her left ankle in 2020. She wore a brace for 2 weeks while at work and had restrictions including pt wearing a brace while at work. She stopped wearing it after 2 weeks but never got released from wearing the brace. Her worker's comp case was closed in Dec 2020. She will be working for [employer] next week. She disclosed the restriction letter and they are needing a release note from her stating she doesn't have to wear a brace at work and is able to do normal activities. Pt no longer has any issues with her foot.  Pt doesn't require any modifications for her left ankle and is able to perform all tasks.   Review of Systems  All other systems reviewed and are negative.      Objective:    BP 116/67   Pulse 73   Temp 98 F (36.7 C) (Oral)   Resp 18   Ht 5' 3.75" (1.619 m)   Wt 224 lb (101.6 kg)   SpO2 98%   BMI 38.75 kg/m    Physical Exam Vitals and nursing note reviewed.  Constitutional:      Appearance: Normal appearance. She is normal weight.  HENT:     Head: Normocephalic and atraumatic.     Right Ear: External ear normal.     Left Ear: External ear normal.     Nose: Nose normal.     Mouth/Throat:     Mouth: Mucous membranes are moist.     Pharynx: Oropharynx is clear.  Eyes:     Conjunctiva/sclera: Conjunctivae normal.     Pupils: Pupils are equal, round, and reactive to light.  Cardiovascular:     Rate and Rhythm: Normal rate.  Pulmonary:     Effort: Pulmonary effort is normal.  Skin:     General: Skin is warm.     Capillary Refill: Capillary refill takes less than 2 seconds.  Neurological:     General: No focal deficit present.     Mental Status: She is alert and oriented to person, place, and time. Mental status is at baseline.  Psychiatric:        Mood and Affect: Mood normal.        Behavior: Behavior normal.        Thought Content: Thought content normal.        Judgment: Judgment normal.   No results found for any visits on 05/21/23.      Assessment & Plan:   Problem List Items Addressed This Visit   None   No orders of the defined types were placed in this encounter. Injury of left ankle, sequela   Left ankle injury healed. Pt needed release for work with her new employer. Worker's comp case closed in 2020.    No follow-ups on file.  Suzan Slick, MD

## 2023-06-01 DIAGNOSIS — F802 Mixed receptive-expressive language disorder: Secondary | ICD-10-CM | POA: Diagnosis not present

## 2023-06-07 DIAGNOSIS — G4733 Obstructive sleep apnea (adult) (pediatric): Secondary | ICD-10-CM | POA: Diagnosis not present

## 2023-06-15 ENCOUNTER — Other Ambulatory Visit: Payer: Self-pay | Admitting: Family Medicine

## 2023-06-15 DIAGNOSIS — K219 Gastro-esophageal reflux disease without esophagitis: Secondary | ICD-10-CM

## 2023-06-15 MED ORDER — PANTOPRAZOLE SODIUM 40 MG PO TBEC
40.0000 mg | DELAYED_RELEASE_TABLET | Freq: Every day | ORAL | 1 refills | Status: DC
Start: 2023-06-15 — End: 2023-08-27
  Filled 2023-06-15 – 2023-06-19 (×2): qty 30, 30d supply, fill #0
  Filled 2023-07-18: qty 30, 30d supply, fill #1

## 2023-06-16 ENCOUNTER — Other Ambulatory Visit (HOSPITAL_COMMUNITY): Payer: Self-pay

## 2023-06-18 ENCOUNTER — Other Ambulatory Visit (HOSPITAL_COMMUNITY): Payer: Self-pay

## 2023-06-19 ENCOUNTER — Other Ambulatory Visit (HOSPITAL_COMMUNITY): Payer: Self-pay

## 2023-06-20 DIAGNOSIS — G4733 Obstructive sleep apnea (adult) (pediatric): Secondary | ICD-10-CM | POA: Diagnosis not present

## 2023-06-26 ENCOUNTER — Ambulatory Visit: Payer: 59 | Admitting: Allergy

## 2023-07-11 NOTE — Progress Notes (Unsigned)
Follow Up Note  RE: Stefanie White MRN: [id number] DOB: 21-Sep-1990 Date of Office Visit: 07/12/2023  Referring provider: Suzan Slick, MD Primary care provider: Suzan Slick, MD  Chief Complaint: No chief complaint on file.  History of Present Illness: I had the pleasure of seeing Stefanie White for a follow up visit at the [facility] on 07/11/2023. She is a 33 y.o. female, who is being followed for allergic rhinoconjunctivitis, recurrent infections and GERD. Her previous allergy office visit was on 03/20/2023 with Dr. Selena Batten. Today is a regular follow up visit.  Discussed the use of AI scribe software for clinical note transcription with the patient, who gave verbal consent to proceed.  History of Present Illness            ***  Assessment and Plan: Stefanie White is a 33 y.o. female with: Seasonal allergic rhinitis due to pollen Allergic rhinitis due to dust mite Allergic rhinitis due to animal dander Allergic rhinitis due to mold Allergic conjunctivitis of both eyes Past history - Perennial rhinoconjunctivitis symptoms for 10 years which flares in the spring.  No prior AIT.  Saw ENT and unremarkable workup. 3 cats at home. 2024 kin testing Positive to grass, weed, ragweed, trees, dust mites, cat, dog. Borderline to mold. Interim history - symptomatic if misses meds, using dymista inconsistently.  Continue environmental control measures as below. Use over the counter antihistamines such as Zyrtec (cetirizine), Claritin (loratadine), Allegra (fexofenadine), or Xyzal (levocetirizine) daily as needed. May take twice a day during allergy flares. May switch antihistamines every few months. Continue Singulair (montelukast) 10mg  daily at night. Continue dymista (fluticasone + azelastine nasal spray combination) 1 spray per nostril twice a day. Nasal saline spray (i.e., Simply Saline) or nasal saline lavage (i.e., NeilMed) is recommended as needed and prior to  medicated nasal sprays. Consider allergy injections for long term control if above medications do not help the symptoms.  Let us know when ready to start.    Recurrent infections Past history - At least 1 sinus infections per year and at times requiring multiple courses of antibiotics. Keep track of infections and antibiotics use. If persistent will get bloodwork next to look at immune system.    Gastroesophageal reflux disease, unspecified whether esophagitis present Caffeine is a trigger.  Continue lifestyle and dietary modifications. May take Protonix 40mg  once day as needed - nothing to eat or drink for 20-30 minutes afterwards.  Assessment and Plan              No follow-ups on file.  No orders of the defined types were placed in this encounter.  Lab Orders  No laboratory test(s) ordered today    Diagnostics: Spirometry:  Tracings reviewed. Her effort: {Blank single:19197::"Good reproducible efforts.","It was hard to get consistent efforts and there is a question as to whether this reflects a maximal maneuver.","Poor effort, data can not be interpreted."} FVC: ***L FEV1: ***L, ***% predicted FEV1/FVC ratio: ***% Interpretation: {Blank single:19197::"Spirometry consistent with mild obstructive disease","Spirometry consistent with moderate obstructive disease","Spirometry consistent with severe obstructive disease","Spirometry consistent with possible restrictive disease","Spirometry consistent with mixed obstructive and restrictive disease","Spirometry uninterpretable due to technique","Spirometry consistent with normal pattern","No overt abnormalities noted given today's efforts"}.  Please see scanned spirometry results for details.  Skin Testing: {Blank single:19197::"Select foods","Environmental allergy panel","Environmental allergy panel and select foods","Food allergy panel","None","Deferred due to recent antihistamines use"}. *** Results discussed with  patient/family.   Medication List:  Current Outpatient Medications  Medication Sig  Dispense Refill  . Azelastine-Fluticasone 137-50 MCG/ACT SUSP Place 1 spray into the nose in the morning and at bedtime. 23 g 5  . levocetirizine (XYZAL) 5 MG tablet Take 1 tablet (5 mg total) by mouth every evening. 90 tablet 3  . montelukast (SINGULAIR) 10 MG tablet Take 1 tablet (10 mg total) by mouth at bedtime. 90 tablet 3  . Norethin Ace-Eth Estrad-FE (TARINA FE 1/20 PO) Take by mouth.    . norethindrone-ethinyl estradiol-FE (JUNEL FE 1/20) 1-20 MG-MCG tablet Take 1 tablet by mouth daily. 84 tablet 4  . pantoprazole (PROTONIX) 40 MG tablet Take 1 tablet (40 mg total) by mouth daily. 30 tablet 1   No current facility-administered medications for this visit.   Allergies: Allergies  Allergen Reactions  . Rizatriptan Swelling and Other (See Comments)    Mouth swelling   I reviewed her past medical history, social history, family history, and environmental history and no significant changes have been reported from her previous visit.  Review of Systems  Constitutional:  Negative for appetite change, chills, fever and unexpected weight change.  HENT:  Positive for congestion. Negative for rhinorrhea.   Eyes:  Negative for itching.  Respiratory:  Negative for cough, chest tightness, shortness of breath and wheezing.   Cardiovascular:  Negative for chest pain.  Gastrointestinal:  Negative for abdominal pain.  Genitourinary:  Negative for difficulty urinating.  Skin:  Negative for rash.  Allergic/Immunologic: Positive for environmental allergies.   Objective: There were no vitals taken for this visit. There is no height or weight on file to calculate BMI. Physical Exam Vitals and nursing note reviewed.  Constitutional:      Appearance: Normal appearance. She is well-developed.  HENT:     Head: Normocephalic and atraumatic.     Right Ear: Tympanic membrane and external ear normal.     Left Ear:  Tympanic membrane and external ear normal.     Nose: Congestion present.     Mouth/Throat:     Mouth: Mucous membranes are moist.     Pharynx: Oropharynx is clear.  Eyes:     Conjunctiva/sclera: Conjunctivae normal.  Cardiovascular:     Rate and Rhythm: Normal rate and regular rhythm.     Heart sounds: Normal heart sounds. No murmur heard.    No friction rub. No gallop.  Pulmonary:     Effort: Pulmonary effort is normal.     Breath sounds: Normal breath sounds. No wheezing, rhonchi or rales.  Musculoskeletal:     Cervical back: Neck supple.  Skin:    General: Skin is warm.     Findings: No rash.  Neurological:     Mental Status: She is alert and oriented to person, place, and time.  Psychiatric:        Behavior: Behavior normal.  Previous notes and tests were reviewed. The plan was reviewed with the patient/family, and all questions/concerned were addressed.  It was my pleasure to see Stefanie White today and participate in her care. Please feel free to contact me with any questions or concerns.  Sincerely,  Wyline Mood, DO Allergy & Immunology  [facility] office: [phone] [facility] office: [phone]

## 2023-07-12 ENCOUNTER — Ambulatory Visit: Payer: 59 | Admitting: Allergy

## 2023-07-12 ENCOUNTER — Encounter: Payer: Self-pay | Admitting: Allergy

## 2023-07-12 ENCOUNTER — Other Ambulatory Visit: Payer: Self-pay

## 2023-07-12 VITALS — BP 110/74 | HR 86 | Temp 98.0°F | Resp 14 | Ht 63.5 in | Wt 223.2 lb

## 2023-07-12 DIAGNOSIS — B999 Unspecified infectious disease: Secondary | ICD-10-CM

## 2023-07-12 DIAGNOSIS — J3089 Other allergic rhinitis: Secondary | ICD-10-CM

## 2023-07-12 DIAGNOSIS — J301 Allergic rhinitis due to pollen: Secondary | ICD-10-CM | POA: Diagnosis not present

## 2023-07-12 DIAGNOSIS — J3081 Allergic rhinitis due to animal (cat) (dog) hair and dander: Secondary | ICD-10-CM | POA: Diagnosis not present

## 2023-07-12 DIAGNOSIS — K219 Gastro-esophageal reflux disease without esophagitis: Secondary | ICD-10-CM

## 2023-07-12 DIAGNOSIS — H1013 Acute atopic conjunctivitis, bilateral: Secondary | ICD-10-CM | POA: Diagnosis not present

## 2023-07-12 NOTE — Patient Instructions (Addendum)
Environmental allergies 2024 skin testing positive to grass, weed, ragweed, trees, dust mites, cat, dog. Borderline to mold. Continue environmental control measures as below. Use over the counter antihistamines such as Zyrtec (cetirizine), Claritin (loratadine), Allegra (fexofenadine), or Xyzal (levocetirizine) daily as needed. May take twice a day during allergy flares. May switch antihistamines every few months. Continue Singulair (montelukast) 10mg  daily at night. Continue dymista (fluticasone + azelastine nasal spray combination) 1 spray per nostril twice a day. Nasal saline spray (i.e., Simply Saline) or nasal saline lavage (i.e., NeilMed) is recommended as needed and prior to medicated nasal sprays. Start allergy injections. 2 injections. Let us know when you are ready to start. Also let us know whether you want to do the traditional build up vs the rush build up (this is done in our [place] or [employer] office only - then you can continue getting injections in the [facility] office).  Had a detailed discussion with patient/family that clinical history is suggestive of allergic rhinitis, and may benefit from allergy immunotherapy (AIT). Discussed in detail regarding the dosing, schedule, side effects (mild to moderate local allergic reaction and rarely systemic allergic reactions including anaphylaxis), and benefits (significant improvement in nasal symptoms, seasonal flares of asthma) of immunotherapy with the patient. There is significant time commitment involved with allergy shots, which includes weekly immunotherapy injections for first 9-12 months and then biweekly to monthly injections for 3-5 years. Consent was signed. For mild symptoms you can take over the counter antihistamines such as Benadryl and monitor symptoms closely. If symptoms worsen or if you have severe symptoms including breathing issues, throat closure, significant swelling, whole body hives, severe diarrhea and vomiting,  lightheadedness then inject epinephrine and seek immediate medical care afterwards. Emergency action plan given. I will send in the epipen once you are on the schedule to start injections.   Reflux Continue lifestyle and dietary modifications. May take Protonix 40mg  once day as needed - nothing to eat or drink for 20-30 minutes afterwards.   Infections Keep track of infections and antibiotics use. If persistent will get bloodwork next to look at immune system.   Follow up in 6 months or sooner if needed.    Reducing Pollen Exposure Pollen seasons: trees (spring), grass (summer) and ragweed/weeds (fall). Keep windows closed in your home and car to lower pollen exposure.  Install air conditioning in the bedroom and throughout the house if possible.  Avoid going out in dry windy days - especially early morning. Pollen counts are highest between 5 - 10 AM and on dry, hot and windy days.  Save outside activities for late afternoon or after a heavy rain, when pollen levels are lower.  Avoid mowing of grass if you have grass pollen allergy. Be aware that pollen can also be transported indoors on people and pets.  Dry your clothes in an automatic dryer rather than hanging them outside where they might collect pollen.  Rinse hair and eyes before bedtime. Mold Control Mold and fungi can grow on a variety of surfaces provided certain temperature and moisture conditions exist.  Outdoor molds grow on plants, decaying vegetation and soil. The major outdoor mold, Alternaria and Cladosporium, are found in very high numbers during hot and dry conditions. Generally, a late summer - fall peak is seen for common outdoor fungal spores. Rain will temporarily lower outdoor mold spore count, but counts rise rapidly when the rainy period ends. The most important indoor molds are Aspergillus and Penicillium. Dark, humid and poorly ventilated  basements are ideal sites for mold growth. The next most common sites of  mold growth are the bathroom and the kitchen. Outdoor (Seasonal) Mold Control Use air conditioning and keep windows closed. Avoid exposure to decaying vegetation. Avoid leaf raking. Avoid grain handling. Consider wearing a face mask if working in moldy areas.  Indoor (Perennial) Mold Control  Maintain humidity below 50%. Get rid of mold growth on hard surfaces with water, detergent and, if necessary, 5% bleach (do not mix with other cleaners). Then dry the area completely. If mold covers an area more than 10 square feet, consider hiring an indoor environmental professional. For clothing, washing with soap and water is best. If moldy items cannot be cleaned and dried, throw them away. Remove sources e.g. contaminated carpets. Repair and seal leaking roofs or pipes. Using dehumidifiers in damp basements may be helpful, but empty the water and clean units regularly to prevent mildew from forming. All rooms, especially basements, bathrooms and kitchens, require ventilation and cleaning to deter mold and mildew growth. Avoid carpeting on concrete or damp floors, and storing items in damp areas. Control of House Dust Mite Allergen Dust mite allergens are a common trigger of allergy and asthma symptoms. While they can be found throughout the house, these microscopic creatures thrive in warm, humid environments such as bedding, upholstered furniture and carpeting. Because so much time is spent in the bedroom, it is essential to reduce mite levels there.  Encase pillows, mattresses, and box springs in special allergen-proof fabric covers or airtight, zippered plastic covers.  Bedding should be washed weekly in hot water (130 F) and dried in a hot dryer. Allergen-proof covers are available for comforters and pillows that can't be regularly washed.  Wash the allergy-proof covers every few months. Minimize clutter in the bedroom. Keep pets out of the bedroom.  Keep humidity less than 50% by using a  dehumidifier or air conditioning. You can buy a humidity measuring device called a hygrometer to monitor this.  If possible, replace carpets with hardwood, linoleum, or washable area rugs. If that's not possible, vacuum frequently with a vacuum that has a HEPA filter. Remove all upholstered furniture and non-washable window drapes from the bedroom. Remove all non-washable stuffed toys from the bedroom.  Wash stuffed toys weekly. Pet Allergen Avoidance: Contrary to popular opinion, there are no "hypoallergenic" breeds of dogs or cats. That is because people are not allergic to an animal's hair, but to an allergen found in the animal's saliva, dander (dead skin flakes) or urine. Pet allergy symptoms typically occur within minutes. For some people, symptoms can build up and become most severe 8 to 12 hours after contact with the animal. People with severe allergies can experience reactions in public places if dander has been transported on the pet owners' clothing. Keeping an animal outdoors is only a partial solution, since homes with pets in the yard still have higher concentrations of animal allergens. Before getting a pet, ask your allergist to determine if you are allergic to animals. If your pet is already considered part of your family, try to minimize contact and keep the pet out of the bedroom and other rooms where you spend a great deal of time. As with dust mites, vacuum carpets often or replace carpet with a hardwood floor, tile or linoleum. High-efficiency particulate air (HEPA) cleaners can reduce allergen levels over time. While dander and saliva are the source of cat and dog allergens, urine is the source of allergens from rabbits, hamsters,  mice and [place] pigs; so ask a non-allergic family member to clean the animal's cage. If you have a pet allergy, talk to your allergist about the potential for allergy immunotherapy (allergy shots). This strategy can often provide long-term relief.

## 2023-07-18 ENCOUNTER — Other Ambulatory Visit: Payer: Self-pay

## 2023-07-18 ENCOUNTER — Other Ambulatory Visit (HOSPITAL_COMMUNITY): Payer: Self-pay

## 2023-08-27 ENCOUNTER — Other Ambulatory Visit: Payer: Self-pay

## 2023-08-27 ENCOUNTER — Other Ambulatory Visit: Payer: Self-pay | Admitting: Family Medicine

## 2023-08-27 ENCOUNTER — Other Ambulatory Visit (HOSPITAL_COMMUNITY): Payer: Self-pay

## 2023-08-27 DIAGNOSIS — K219 Gastro-esophageal reflux disease without esophagitis: Secondary | ICD-10-CM

## 2023-08-27 MED ORDER — PANTOPRAZOLE SODIUM 40 MG PO TBEC
40.0000 mg | DELAYED_RELEASE_TABLET | Freq: Every day | ORAL | 1 refills | Status: DC
Start: 2023-08-27 — End: 2023-10-30
  Filled 2023-08-27: qty 30, 30d supply, fill #0
  Filled 2023-09-21: qty 30, 30d supply, fill #1

## 2023-08-29 ENCOUNTER — Other Ambulatory Visit: Payer: Self-pay

## 2023-08-29 ENCOUNTER — Other Ambulatory Visit (HOSPITAL_COMMUNITY): Payer: Self-pay

## 2023-09-03 ENCOUNTER — Other Ambulatory Visit (HOSPITAL_COMMUNITY): Payer: Self-pay

## 2023-09-19 ENCOUNTER — Other Ambulatory Visit (HOSPITAL_COMMUNITY): Payer: Self-pay

## 2023-10-30 ENCOUNTER — Other Ambulatory Visit: Payer: Self-pay | Admitting: Family Medicine

## 2023-10-30 DIAGNOSIS — K219 Gastro-esophageal reflux disease without esophagitis: Secondary | ICD-10-CM

## 2023-10-31 ENCOUNTER — Other Ambulatory Visit (HOSPITAL_COMMUNITY): Payer: Self-pay

## 2023-10-31 ENCOUNTER — Other Ambulatory Visit: Payer: Self-pay

## 2023-10-31 MED ORDER — PANTOPRAZOLE SODIUM 40 MG PO TBEC
40.0000 mg | DELAYED_RELEASE_TABLET | Freq: Every day | ORAL | 1 refills | Status: DC
Start: 2023-10-31 — End: 2024-01-08
  Filled 2023-10-31: qty 30, 30d supply, fill #0
  Filled 2023-12-11: qty 30, 30d supply, fill #1

## 2023-11-15 ENCOUNTER — Other Ambulatory Visit: Payer: Self-pay

## 2023-11-17 ENCOUNTER — Other Ambulatory Visit (HOSPITAL_COMMUNITY): Payer: Self-pay

## 2023-11-20 ENCOUNTER — Other Ambulatory Visit (HOSPITAL_COMMUNITY): Payer: Self-pay

## 2024-01-08 ENCOUNTER — Other Ambulatory Visit: Payer: Self-pay

## 2024-01-08 ENCOUNTER — Other Ambulatory Visit (HOSPITAL_COMMUNITY): Payer: Self-pay

## 2024-01-08 ENCOUNTER — Other Ambulatory Visit: Payer: Self-pay | Admitting: Family Medicine

## 2024-01-08 DIAGNOSIS — K219 Gastro-esophageal reflux disease without esophagitis: Secondary | ICD-10-CM

## 2024-01-08 MED ORDER — PANTOPRAZOLE SODIUM 40 MG PO TBEC
40.0000 mg | DELAYED_RELEASE_TABLET | Freq: Every day | ORAL | 1 refills | Status: DC
Start: 2024-01-08 — End: 2024-03-03
  Filled 2024-01-08: qty 30, 30d supply, fill #0
  Filled 2024-02-06: qty 30, 30d supply, fill #1

## 2024-01-10 ENCOUNTER — Ambulatory Visit: Payer: Managed Care, Other (non HMO) | Admitting: Allergy

## 2024-02-01 ENCOUNTER — Other Ambulatory Visit (HOSPITAL_COMMUNITY): Payer: Self-pay

## 2024-02-08 ENCOUNTER — Ambulatory Visit (INDEPENDENT_AMBULATORY_CARE_PROVIDER_SITE_OTHER): Payer: Self-pay | Admitting: Family Medicine

## 2024-02-08 ENCOUNTER — Encounter: Payer: Self-pay | Admitting: Family Medicine

## 2024-02-08 VITALS — BP 109/71 | HR 76 | Temp 97.7°F | Resp 18 | Ht 63.5 in | Wt 222.9 lb

## 2024-02-08 DIAGNOSIS — R748 Abnormal levels of other serum enzymes: Secondary | ICD-10-CM

## 2024-02-08 DIAGNOSIS — R7989 Other specified abnormal findings of blood chemistry: Secondary | ICD-10-CM

## 2024-02-08 DIAGNOSIS — R112 Nausea with vomiting, unspecified: Secondary | ICD-10-CM

## 2024-02-08 DIAGNOSIS — R1032 Left lower quadrant pain: Secondary | ICD-10-CM

## 2024-02-08 MED ORDER — DICYCLOMINE HCL 20 MG PO TABS
20.0000 mg | ORAL_TABLET | Freq: Three times a day (TID) | ORAL | 0 refills | Status: AC
Start: 2024-02-08 — End: ?

## 2024-02-08 NOTE — Progress Notes (Signed)
 Established Patient Office Visit  Subjective   Patient ID: Stefanie White, female    DOB: 06/27/1990  Age: 33 y.o. MRN: [id number]  No chief complaint on file.   HPI  Abdominal pain Pt reports the Thursday before going to ER on 8/26; she started vomiting at work. She says she vomited for the next 2 days. She started eating toasts on Friday. She reports the vomiting stopped but she still felt bad until Sunday. She went back to work on Sunday. Monday she felt normal. She woke up on Tuesday with sharp pain on her LLQ. She went to work that day. She tried to eat lunch and pain worsened. She started vomited x 3 and went to ER on that day. She had labs done and had high lipase and LFT's. She had CT scan that showed mild hepatomegaly. She says the nausea is gone but still has some pain when she eats. She was given Bentyl  BID and zofran . Needs refills on the Bentyl .    Review of Systems  Gastrointestinal:  Positive for abdominal pain. Negative for nausea and vomiting.  All other systems reviewed and are negative.     Objective:     There were no vitals taken for this visit. BP Readings from Last 3 Encounters:  02/08/24 109/71  07/12/23 110/74  05/21/23 116/67      Physical Exam Vitals and nursing note reviewed.  Constitutional:      Appearance: Normal appearance. She is normal weight.  HENT:     Head: Normocephalic and atraumatic.     Right Ear: External ear normal.     Left Ear: External ear normal.     Nose: Nose normal.     Mouth/Throat:     Mouth: Mucous membranes are moist.     Pharynx: Oropharynx is clear.  Eyes:     Conjunctiva/sclera: Conjunctivae normal.     Pupils: Pupils are equal, round, and reactive to light.  Cardiovascular:     Rate and Rhythm: Normal rate and regular rhythm.     Pulses: Normal pulses.     Heart sounds: Normal heart sounds.  Pulmonary:     Effort: Pulmonary effort is normal.     Breath sounds: Normal breath sounds.  Abdominal:      General: Abdomen is flat. Bowel sounds are normal. There is no distension.     Tenderness: There is no abdominal tenderness. There is no guarding or rebound.  Skin:    General: Skin is warm.     Capillary Refill: Capillary refill takes less than 2 seconds.  Neurological:     General: No focal deficit present.     Mental Status: She is alert and oriented to person, place, and time. Mental status is at baseline.  Psychiatric:        Mood and Affect: Mood normal.        Behavior: Behavior normal.        Thought Content: Thought content normal.        Judgment: Judgment normal.     No results found for any visits on 02/08/24.  Last metabolic panel Lab Results  Component Value Date   GLUCOSE 124 (H) 12/19/2022   NA 135 12/19/2022   K 3.9 12/19/2022   CL 104 12/19/2022   CO2 23 12/19/2022   BUN 11 12/19/2022   CREATININE 0.48 12/19/2022   GFRNONAA >60 12/19/2022   CALCIUM 9.4 12/19/2022   PROT 7.1 12/19/2022   ALBUMIN 4.1 12/19/2022  LABGLOB 2.8 09/26/2022   AGRATIO 1.5 09/26/2022   BILITOT 0.4 12/19/2022   ALKPHOS 86 12/19/2022   AST 26 12/19/2022   ALT 33 12/19/2022   ANIONGAP 8 12/19/2022      The ASCVD Risk score (Arnett DK, et al., 2019) failed to calculate for the following reasons:   The 2019 ASCVD risk score is only valid for ages 16 to 19    Assessment & Plan:   Problem List Items Addressed This Visit   None  Left lower quadrant abdominal pain -     Dicyclomine  HCl; Take 1 tablet (20 mg total) by mouth 3 (three) times daily before meals.  Dispense: 45 tablet; Refill: 0  Nausea and vomiting, unspecified vomiting type -     Dicyclomine  HCl; Take 1 tablet (20 mg total) by mouth 3 (three) times daily before meals.  Dispense: 45 tablet; Refill: 0  Elevated LFTs -     Comprehensive metabolic panel with GFR  Elevated lipase -     Lipase -     Amylase   Pt with recent ER visit. To recheck LFT's and amylase/lipase. If still abnormal, refer to GI. Suspect  acute gastroenteritis. Refilled Bentyl  20mg  to use before meals TID. Discussed clear liquid diet.  Follow up on results.  No follow-ups on file.    Torrence CINDERELLA Barrier, MD

## 2024-02-09 LAB — COMPREHENSIVE METABOLIC PANEL WITH GFR
ALT: 55 IU/L — ABNORMAL HIGH (ref 0–32)
AST: 54 IU/L — ABNORMAL HIGH (ref 0–40)
Albumin: 4.1 g/dL (ref 3.9–4.9)
Alkaline Phosphatase: 86 IU/L (ref 44–121)
BUN/Creatinine Ratio: 16 (ref 9–23)
BUN: 10 mg/dL (ref 6–20)
Bilirubin Total: 0.3 mg/dL (ref 0.0–1.2)
CO2: 20 mmol/L (ref 20–29)
Calcium: 9.4 mg/dL (ref 8.7–10.2)
Chloride: 104 mmol/L (ref 96–106)
Creatinine, Ser: 0.62 mg/dL (ref 0.57–1.00)
Globulin, Total: 2.9 g/dL (ref 1.5–4.5)
Glucose: 125 mg/dL — ABNORMAL HIGH (ref 70–99)
Potassium: 4.2 mmol/L (ref 3.5–5.2)
Sodium: 138 mmol/L (ref 134–144)
Total Protein: 7 g/dL (ref 6.0–8.5)
eGFR: 121 mL/min/1.73 (ref >59)

## 2024-02-09 LAB — LIPASE: Lipase: 49 U/L (ref 14–72)

## 2024-02-09 LAB — AMYLASE: Amylase: 96 U/L (ref 31–110)

## 2024-02-14 ENCOUNTER — Encounter: Payer: Self-pay | Admitting: Family Medicine

## 2024-02-14 DIAGNOSIS — R1032 Left lower quadrant pain: Secondary | ICD-10-CM

## 2024-02-14 DIAGNOSIS — R7989 Other specified abnormal findings of blood chemistry: Secondary | ICD-10-CM

## 2024-03-03 ENCOUNTER — Other Ambulatory Visit: Payer: Self-pay | Admitting: Family Medicine

## 2024-03-03 ENCOUNTER — Other Ambulatory Visit (HOSPITAL_COMMUNITY): Payer: Self-pay

## 2024-03-03 ENCOUNTER — Other Ambulatory Visit: Payer: Self-pay

## 2024-03-03 DIAGNOSIS — K219 Gastro-esophageal reflux disease without esophagitis: Secondary | ICD-10-CM

## 2024-03-03 MED ORDER — PANTOPRAZOLE SODIUM 40 MG PO TBEC
40.0000 mg | DELAYED_RELEASE_TABLET | Freq: Every day | ORAL | 1 refills | Status: DC
  Filled 2024-03-03: qty 30, 30d supply, fill #0
  Filled 2024-04-16: qty 30, 30d supply, fill #1

## 2024-04-16 ENCOUNTER — Other Ambulatory Visit: Payer: Self-pay

## 2024-04-16 ENCOUNTER — Other Ambulatory Visit: Payer: Self-pay | Admitting: Allergy

## 2024-04-17 ENCOUNTER — Other Ambulatory Visit: Payer: Self-pay

## 2024-04-17 ENCOUNTER — Other Ambulatory Visit: Payer: Self-pay | Admitting: Allergy

## 2024-04-17 MED ORDER — MONTELUKAST SODIUM 10 MG PO TABS
10.0000 mg | ORAL_TABLET | Freq: Every day | ORAL | 0 refills | Status: AC
  Filled 2024-04-17: qty 30, 30d supply, fill #0
  Filled 2024-06-02: qty 30, 30d supply, fill #1

## 2024-04-17 MED ORDER — LEVOCETIRIZINE DIHYDROCHLORIDE 5 MG PO TABS
5.0000 mg | ORAL_TABLET | Freq: Every evening | ORAL | 0 refills | Status: AC
  Filled 2024-04-17: qty 30, 30d supply, fill #0
  Filled 2024-06-02: qty 30, 30d supply, fill #1

## 2024-04-22 ENCOUNTER — Other Ambulatory Visit (HOSPITAL_COMMUNITY): Payer: Self-pay

## 2024-04-23 ENCOUNTER — Other Ambulatory Visit (HOSPITAL_COMMUNITY): Payer: Self-pay

## 2024-05-02 ENCOUNTER — Other Ambulatory Visit (HOSPITAL_COMMUNITY): Payer: Self-pay

## 2024-05-05 ENCOUNTER — Other Ambulatory Visit: Payer: Self-pay

## 2024-05-06 ENCOUNTER — Other Ambulatory Visit (HOSPITAL_COMMUNITY): Payer: Self-pay

## 2024-05-06 MED ORDER — JUNEL FE 1/20 1-20 MG-MCG PO TABS
1.0000 | ORAL_TABLET | Freq: Every day | ORAL | 4 refills | Status: AC
  Filled 2024-05-06 – 2024-05-07 (×2): qty 28, 28d supply, fill #0
  Filled 2024-06-02: qty 28, 28d supply, fill #1
  Filled 2024-06-27: qty 28, 28d supply, fill #2

## 2024-05-07 ENCOUNTER — Other Ambulatory Visit (HOSPITAL_COMMUNITY): Payer: Self-pay

## 2024-05-12 ENCOUNTER — Other Ambulatory Visit (HOSPITAL_COMMUNITY): Payer: Self-pay

## 2024-06-02 ENCOUNTER — Other Ambulatory Visit: Payer: Self-pay | Admitting: Family Medicine

## 2024-06-02 ENCOUNTER — Other Ambulatory Visit (HOSPITAL_COMMUNITY): Payer: Self-pay

## 2024-06-02 ENCOUNTER — Other Ambulatory Visit: Payer: Self-pay

## 2024-06-02 DIAGNOSIS — K219 Gastro-esophageal reflux disease without esophagitis: Secondary | ICD-10-CM

## 2024-06-02 MED ORDER — PANTOPRAZOLE SODIUM 40 MG PO TBEC
40.0000 mg | DELAYED_RELEASE_TABLET | Freq: Every day | ORAL | 1 refills | Status: AC
  Filled 2024-06-02: qty 30, 30d supply, fill #0

## 2024-06-09 ENCOUNTER — Other Ambulatory Visit (HOSPITAL_COMMUNITY): Payer: Self-pay

## 2024-06-09 MED ORDER — ALBUTEROL SULFATE HFA 108 (90 BASE) MCG/ACT IN AERS
INHALATION_SPRAY | RESPIRATORY_TRACT | 0 refills | Status: AC
  Filled 2024-06-09: qty 6.7, 17d supply, fill #0

## 2024-06-09 MED ORDER — PSEUDOEPH-BROMPHEN-DM 30-2-10 MG/5ML PO SYRP
ORAL_SOLUTION | ORAL | 0 refills | Status: AC
  Filled 2024-06-09: qty 180, 12d supply, fill #0

## 2024-06-27 ENCOUNTER — Other Ambulatory Visit (HOSPITAL_COMMUNITY): Payer: Self-pay

## 2024-06-30 ENCOUNTER — Other Ambulatory Visit: Payer: Self-pay
